# Patient Record
Sex: Female | Born: 1940 | Race: White | Hispanic: No | Smoking: Never smoker
Health system: Southern US, Community
[De-identification: ages and names within clinical notes are randomized; demographics above are authoritative.]

## PROBLEM LIST (undated history)

## (undated) DIAGNOSIS — M109 Gout, unspecified: Secondary | ICD-10-CM

## (undated) DIAGNOSIS — I2510 Atherosclerotic heart disease of native coronary artery without angina pectoris: Secondary | ICD-10-CM

## (undated) DIAGNOSIS — F419 Anxiety disorder, unspecified: Secondary | ICD-10-CM

## (undated) DIAGNOSIS — E119 Type 2 diabetes mellitus without complications: Secondary | ICD-10-CM

## (undated) DIAGNOSIS — I10 Essential (primary) hypertension: Secondary | ICD-10-CM

## (undated) DIAGNOSIS — I82409 Acute embolism and thrombosis of unspecified deep veins of unspecified lower extremity: Secondary | ICD-10-CM

## (undated) DIAGNOSIS — F99 Mental disorder, not otherwise specified: Secondary | ICD-10-CM

## (undated) DIAGNOSIS — R296 Repeated falls: Secondary | ICD-10-CM

## (undated) DIAGNOSIS — I209 Angina pectoris, unspecified: Secondary | ICD-10-CM

## (undated) DIAGNOSIS — E785 Hyperlipidemia, unspecified: Secondary | ICD-10-CM

## (undated) DIAGNOSIS — I743 Embolism and thrombosis of arteries of the lower extremities: Secondary | ICD-10-CM

## (undated) DIAGNOSIS — C439 Malignant melanoma of skin, unspecified: Secondary | ICD-10-CM

## (undated) DIAGNOSIS — N189 Chronic kidney disease, unspecified: Secondary | ICD-10-CM

## (undated) DIAGNOSIS — I219 Acute myocardial infarction, unspecified: Secondary | ICD-10-CM

## (undated) DIAGNOSIS — M1990 Unspecified osteoarthritis, unspecified site: Secondary | ICD-10-CM

## (undated) DIAGNOSIS — F329 Major depressive disorder, single episode, unspecified: Secondary | ICD-10-CM

## (undated) DIAGNOSIS — Z9289 Personal history of other medical treatment: Secondary | ICD-10-CM

## (undated) DIAGNOSIS — F32A Depression, unspecified: Secondary | ICD-10-CM

## (undated) DIAGNOSIS — D509 Iron deficiency anemia, unspecified: Secondary | ICD-10-CM

## (undated) DIAGNOSIS — K219 Gastro-esophageal reflux disease without esophagitis: Secondary | ICD-10-CM

## (undated) DIAGNOSIS — N183 Chronic kidney disease, stage 3 unspecified: Secondary | ICD-10-CM

## (undated) DIAGNOSIS — D649 Anemia, unspecified: Secondary | ICD-10-CM

## (undated) DIAGNOSIS — C801 Malignant (primary) neoplasm, unspecified: Secondary | ICD-10-CM

## (undated) DIAGNOSIS — D689 Coagulation defect, unspecified: Secondary | ICD-10-CM

## (undated) HISTORY — PX: CARDIAC CATHETERIZATION: SHX172

## (undated) HISTORY — PX: TOE SURGERY: SHX1073

## (undated) HISTORY — PX: COLONOSCOPY: SHX174

## (undated) HISTORY — PX: TUBAL LIGATION: SHX77

## (undated) HISTORY — PX: BACK SURGERY: SHX140

## (undated) HISTORY — DX: Hyperlipidemia, unspecified: E78.5

## (undated) HISTORY — PX: EYE SURGERY: SHX253

---

## 1965-12-16 HISTORY — PX: APPENDECTOMY: SHX54

## 1965-12-16 HISTORY — PX: CHOLECYSTECTOMY: SHX55

## 2004-10-26 ENCOUNTER — Ambulatory Visit: Payer: Self-pay | Admitting: General Practice

## 2004-12-16 DIAGNOSIS — I82409 Acute embolism and thrombosis of unspecified deep veins of unspecified lower extremity: Secondary | ICD-10-CM

## 2004-12-16 DIAGNOSIS — I82402 Acute embolism and thrombosis of unspecified deep veins of left lower extremity: Secondary | ICD-10-CM

## 2004-12-16 HISTORY — DX: Acute embolism and thrombosis of unspecified deep veins of left lower extremity: I82.402

## 2004-12-16 HISTORY — DX: Acute embolism and thrombosis of unspecified deep veins of unspecified lower extremity: I82.409

## 2005-03-27 ENCOUNTER — Ambulatory Visit: Payer: Self-pay

## 2005-10-28 ENCOUNTER — Ambulatory Visit: Payer: Self-pay | Admitting: Internal Medicine

## 2005-10-30 ENCOUNTER — Ambulatory Visit: Payer: Self-pay | Admitting: Internal Medicine

## 2006-01-21 ENCOUNTER — Ambulatory Visit: Payer: Self-pay | Admitting: Gerontology

## 2006-01-21 ENCOUNTER — Ambulatory Visit: Payer: Self-pay

## 2006-05-06 ENCOUNTER — Ambulatory Visit: Payer: Self-pay | Admitting: Internal Medicine

## 2006-05-11 ENCOUNTER — Emergency Department (HOSPITAL_COMMUNITY): Admission: EM | Admit: 2006-05-11 | Discharge: 2006-05-11 | Payer: Self-pay | Admitting: Emergency Medicine

## 2006-05-12 ENCOUNTER — Ambulatory Visit (HOSPITAL_COMMUNITY): Admission: RE | Admit: 2006-05-12 | Discharge: 2006-05-12 | Payer: Self-pay | Admitting: Emergency Medicine

## 2006-05-14 ENCOUNTER — Emergency Department (HOSPITAL_COMMUNITY): Admission: EM | Admit: 2006-05-14 | Discharge: 2006-05-14 | Payer: Self-pay | Admitting: Emergency Medicine

## 2006-06-02 ENCOUNTER — Ambulatory Visit: Payer: Self-pay | Admitting: Internal Medicine

## 2006-06-02 ENCOUNTER — Ambulatory Visit (HOSPITAL_COMMUNITY): Admission: RE | Admit: 2006-06-02 | Discharge: 2006-06-02 | Payer: Self-pay | Admitting: Neurosurgery

## 2006-06-03 ENCOUNTER — Ambulatory Visit: Payer: Self-pay

## 2006-06-04 ENCOUNTER — Ambulatory Visit (HOSPITAL_COMMUNITY): Admission: RE | Admit: 2006-06-04 | Discharge: 2006-06-05 | Payer: Self-pay | Admitting: Neurosurgery

## 2006-11-13 ENCOUNTER — Ambulatory Visit: Payer: Self-pay | Admitting: Internal Medicine

## 2007-11-16 ENCOUNTER — Ambulatory Visit: Payer: Self-pay | Admitting: Internal Medicine

## 2008-11-16 ENCOUNTER — Ambulatory Visit: Payer: Self-pay | Admitting: Internal Medicine

## 2009-11-20 ENCOUNTER — Ambulatory Visit: Payer: Self-pay | Admitting: Internal Medicine

## 2009-11-23 ENCOUNTER — Ambulatory Visit: Payer: Self-pay | Admitting: Internal Medicine

## 2010-01-29 ENCOUNTER — Ambulatory Visit: Payer: Self-pay

## 2010-11-21 ENCOUNTER — Ambulatory Visit: Payer: Self-pay | Admitting: Internal Medicine

## 2011-01-06 ENCOUNTER — Encounter: Payer: Self-pay | Admitting: Neurosurgery

## 2011-10-16 ENCOUNTER — Ambulatory Visit: Payer: Self-pay | Admitting: Internal Medicine

## 2011-12-12 ENCOUNTER — Ambulatory Visit: Payer: Self-pay | Admitting: Internal Medicine

## 2012-06-22 ENCOUNTER — Observation Stay: Payer: Self-pay | Admitting: Internal Medicine

## 2012-06-22 LAB — COMPREHENSIVE METABOLIC PANEL
Anion Gap: 10 (ref 7–16)
BUN: 25 mg/dL — ABNORMAL HIGH (ref 7–18)
Calcium, Total: 8.8 mg/dL (ref 8.5–10.1)
Creatinine: 1.44 mg/dL — ABNORMAL HIGH (ref 0.60–1.30)
EGFR (African American): 43 — ABNORMAL LOW
Glucose: 191 mg/dL — ABNORMAL HIGH (ref 65–99)
Potassium: 3.2 mmol/L — ABNORMAL LOW (ref 3.5–5.1)
SGOT(AST): 22 U/L (ref 15–37)
SGPT (ALT): 16 U/L
Sodium: 140 mmol/L (ref 136–145)
Total Protein: 7.5 g/dL (ref 6.4–8.2)

## 2012-06-22 LAB — CK TOTAL AND CKMB (NOT AT ARMC)
CK, Total: 107 U/L (ref 21–215)
CK-MB: 0.9 ng/mL (ref 0.5–3.6)

## 2012-06-22 LAB — CBC
HCT: 32.5 % — ABNORMAL LOW (ref 35.0–47.0)
HGB: 10.8 g/dL — ABNORMAL LOW (ref 12.0–16.0)

## 2012-06-23 DIAGNOSIS — Z9289 Personal history of other medical treatment: Secondary | ICD-10-CM

## 2012-06-23 HISTORY — DX: Personal history of other medical treatment: Z92.89

## 2012-12-14 ENCOUNTER — Ambulatory Visit: Payer: Self-pay | Admitting: Internal Medicine

## 2013-03-01 ENCOUNTER — Ambulatory Visit: Payer: Self-pay | Admitting: Physician Assistant

## 2013-05-18 ENCOUNTER — Ambulatory Visit: Payer: Self-pay | Admitting: Neurosurgery

## 2013-06-02 ENCOUNTER — Ambulatory Visit: Payer: Self-pay | Admitting: Neurosurgery

## 2013-06-08 ENCOUNTER — Other Ambulatory Visit: Payer: Self-pay | Admitting: Neurosurgery

## 2013-06-10 ENCOUNTER — Other Ambulatory Visit (HOSPITAL_COMMUNITY): Payer: Self-pay

## 2013-06-14 ENCOUNTER — Encounter (HOSPITAL_COMMUNITY): Payer: Self-pay | Admitting: Pharmacy Technician

## 2013-06-17 NOTE — Pre-Procedure Instructions (Signed)
KEON WALTERMIRE  06/17/2013   Your procedure is scheduled on:  Monday, July 14th.   Report to [place] at 10:15 AM.  Call this number if you have problems the morning of surgery: [phone]   Remember:   Do not eat food or drink liquids after midnight.   Take these medicines the morning of surgery with A SIP OF WATER: citalopram (CELEXA), COLCHICINE, OMEPRAZOLE.              Take if needed:oxyCODONE-acetaminophen (PERCOCET/ROXICET).    Do not wear jewelry, make-up or nail polish.  Do not wear lotions, powders, or perfumes. You may wear deodorant.  Do not shave 48 hours prior to surgery. Men may shave face and neck.  Do not bring valuables to the hospital.  [facility] is not responsible  for any belongings or valuables.  Contacts, dentures or bridgework may not be worn into surgery.  Leave suitcase in the car. After surgery it may be brought to your room.  For patients admitted to the hospital, checkout time is 11:00 AM the day of discharge.   Patients discharged the day of surgery will not be allowed to drive home.  Name and phone number of your driver:-   Special Instructions: Shower using CHG 2 nights before surgery and the night before surgery.  If you shower the day of surgery use CHG.  Use special wash - you have one bottle of CHG for all showers.  You should use approximately 1/3 of the bottle for each shower.   Please read over the following fact sheets that you were given: Pain Booklet, Coughing and Deep Breathing and Surgical Site Infection Prevention

## 2013-06-21 ENCOUNTER — Encounter (HOSPITAL_COMMUNITY)
Admission: RE | Admit: 2013-06-21 | Discharge: 2013-06-21 | Disposition: A | Discharge status: Home / Self Care | Payer: Medicare Other | Source: Ambulatory Visit | Attending: Anesthesiology | Admitting: Anesthesiology

## 2013-06-21 ENCOUNTER — Encounter (HOSPITAL_COMMUNITY)
Admission: RE | Admit: 2013-06-21 | Discharge: 2013-06-21 | Disposition: A | Discharge status: Home / Self Care | Payer: Medicare Other | Source: Ambulatory Visit | Attending: Neurosurgery | Admitting: Neurosurgery

## 2013-06-21 ENCOUNTER — Encounter (HOSPITAL_COMMUNITY): Payer: Self-pay

## 2013-06-21 HISTORY — DX: Type 2 diabetes mellitus without complications: E11.9

## 2013-06-21 HISTORY — DX: Malignant (primary) neoplasm, unspecified: C80.1

## 2013-06-21 HISTORY — DX: Gastro-esophageal reflux disease without esophagitis: K21.9

## 2013-06-21 HISTORY — DX: Mental disorder, not otherwise specified: F99

## 2013-06-21 HISTORY — DX: Acute myocardial infarction, unspecified: I21.9

## 2013-06-21 HISTORY — DX: Depression, unspecified: F32.A

## 2013-06-21 HISTORY — DX: Coagulation defect, unspecified: D68.9

## 2013-06-21 HISTORY — DX: Major depressive disorder, single episode, unspecified: F32.9

## 2013-06-21 HISTORY — DX: Essential (primary) hypertension: I10

## 2013-06-21 LAB — BASIC METABOLIC PANEL
BUN: 25 mg/dL — ABNORMAL HIGH (ref 6–23)
CO2: 27 mEq/L (ref 19–32)
Chloride: 101 mEq/L (ref 96–112)
GFR calc Af Amer: 50 mL/min — ABNORMAL LOW (ref >90)
GFR calc non Af Amer: 43 mL/min — ABNORMAL LOW (ref >90)
Glucose, Bld: 126 mg/dL — ABNORMAL HIGH (ref 70–99)
Potassium: 4.7 mEq/L (ref 3.5–5.1)
Sodium: 139 mEq/L (ref 135–145)

## 2013-06-21 LAB — CBC
HCT: 35.2 % — ABNORMAL LOW (ref 36.0–46.0)
Hemoglobin: 11.6 g/dL — ABNORMAL LOW (ref 12.0–15.0)
MCH: 30.1 pg (ref 26.0–34.0)
Platelets: 226 10*3/uL (ref 150–400)
RBC: 3.85 MIL/uL — ABNORMAL LOW (ref 3.87–5.11)
RDW: 12.7 % (ref 11.5–15.5)

## 2013-06-21 LAB — SURGICAL PCR SCREEN: MRSA, PCR: NEGATIVE

## 2013-06-21 NOTE — Progress Notes (Signed)
REQUESTING STRESS TEST, OFFICE NOTE FROM [place].

## 2013-06-22 ENCOUNTER — Encounter (HOSPITAL_COMMUNITY): Payer: Self-pay

## 2013-06-22 NOTE — Progress Notes (Signed)
Anesthesia chart review: Patient is a 72 year old female scheduled for right L4-5 laminectomy by Dr. Saintclair Halsted on 06/28/2013. History includes nonsmoker, HTN, question of MI > 10 years ago (although not listed in her PCP history and no evidence of infarction on Lexiscan '13), diabetes mellitus type 2, LLE DVT '06, GERD, depression, melanoma (left arm), cholecystectomy, prior back surgery '07. PCP is Dr. Frazier Richards at [facility].  His notes also indicate a history of non-toxic multinodular goiter, chronic anemia with negative work-up, and hypercholesterolemia.     She had a negative Lexiscan nuclear stress test on 06/23/12 showing no evidence of ischemia or infarction, normal LV function [facility]).  EKG on 06/21/13 showed SB @ 49 bpm, LAD, low voltage QRS.  Anterior T wave abnormality less apparent.  Overall, her EKG was felt not significantly changed since 05/29/06 (see Muse).  (HR was 67 bpm at the time of PAT vitals.)  Preoperative CXR and labs noted.    Patient had a negative stress test 1 year ago.  No CV symptoms were documented at her PAT visit.  If no acute changes then I would anticipate that she could proceed as planned.  [facility] Short Stay Center/Anesthesiology Phone [phone] 06/22/2013 11:54 AM

## 2013-06-27 MED ORDER — DEXAMETHASONE SODIUM PHOSPHATE 10 MG/ML IJ SOLN
10.0000 mg | INTRAMUSCULAR | Status: DC
  Filled 2013-06-27: qty 1

## 2013-06-27 MED ORDER — CEFAZOLIN SODIUM-DEXTROSE 2-3 GM-% IV SOLR
2.0000 g | INTRAVENOUS | Status: AC
  Administered 2013-06-28: 2 g via INTRAVENOUS
  Filled 2013-06-27: qty 50

## 2013-06-28 ENCOUNTER — Inpatient Hospital Stay (HOSPITAL_COMMUNITY)
Admission: RE | Admit: 2013-06-28 | Discharge: 2013-06-29 | DRG: 491 | Disposition: A | Discharge status: Home / Self Care | Payer: Medicare Other | Source: Ambulatory Visit | Attending: Neurosurgery | Admitting: Neurosurgery

## 2013-06-28 ENCOUNTER — Encounter (HOSPITAL_COMMUNITY)
Admission: RE | Disposition: A | Discharge status: Home / Self Care | Payer: Self-pay | Source: Ambulatory Visit | Attending: Neurosurgery

## 2013-06-28 ENCOUNTER — Inpatient Hospital Stay (HOSPITAL_COMMUNITY): Payer: Medicare Other

## 2013-06-28 ENCOUNTER — Encounter (HOSPITAL_COMMUNITY): Payer: Self-pay | Admitting: Vascular Surgery

## 2013-06-28 ENCOUNTER — Encounter (HOSPITAL_COMMUNITY): Payer: Self-pay | Admitting: *Deleted

## 2013-06-28 ENCOUNTER — Inpatient Hospital Stay (HOSPITAL_COMMUNITY): Payer: Medicare Other | Admitting: Anesthesiology

## 2013-06-28 DIAGNOSIS — Z01812 Encounter for preprocedural laboratory examination: Secondary | ICD-10-CM

## 2013-06-28 DIAGNOSIS — Z01818 Encounter for other preprocedural examination: Secondary | ICD-10-CM

## 2013-06-28 DIAGNOSIS — Z7982 Long term (current) use of aspirin: Secondary | ICD-10-CM

## 2013-06-28 DIAGNOSIS — E119 Type 2 diabetes mellitus without complications: Secondary | ICD-10-CM | POA: Diagnosis present

## 2013-06-28 DIAGNOSIS — F329 Major depressive disorder, single episode, unspecified: Secondary | ICD-10-CM | POA: Diagnosis present

## 2013-06-28 DIAGNOSIS — I252 Old myocardial infarction: Secondary | ICD-10-CM

## 2013-06-28 DIAGNOSIS — K219 Gastro-esophageal reflux disease without esophagitis: Secondary | ICD-10-CM | POA: Diagnosis present

## 2013-06-28 DIAGNOSIS — Z79899 Other long term (current) drug therapy: Secondary | ICD-10-CM

## 2013-06-28 DIAGNOSIS — M47817 Spondylosis without myelopathy or radiculopathy, lumbosacral region: Principal | ICD-10-CM | POA: Diagnosis present

## 2013-06-28 DIAGNOSIS — Z01810 Encounter for preprocedural cardiovascular examination: Secondary | ICD-10-CM

## 2013-06-28 DIAGNOSIS — I10 Essential (primary) hypertension: Secondary | ICD-10-CM | POA: Diagnosis present

## 2013-06-28 DIAGNOSIS — M109 Gout, unspecified: Secondary | ICD-10-CM | POA: Diagnosis present

## 2013-06-28 DIAGNOSIS — Z85820 Personal history of malignant melanoma of skin: Secondary | ICD-10-CM

## 2013-06-28 DIAGNOSIS — F3289 Other specified depressive episodes: Secondary | ICD-10-CM | POA: Diagnosis present

## 2013-06-28 HISTORY — PX: LUMBAR LAMINECTOMY/DECOMPRESSION MICRODISCECTOMY: SHX5026

## 2013-06-28 LAB — GLUCOSE, CAPILLARY
Glucose-Capillary: 115 mg/dL — ABNORMAL HIGH (ref 70–99)
Glucose-Capillary: 149 mg/dL — ABNORMAL HIGH (ref 70–99)
Glucose-Capillary: 178 mg/dL — ABNORMAL HIGH (ref 70–99)
Glucose-Capillary: 322 mg/dL — ABNORMAL HIGH (ref 70–99)

## 2013-06-28 SURGERY — LUMBAR LAMINECTOMY/DECOMPRESSION MICRODISCECTOMY 1 LEVEL
Anesthesia: General | Site: Back | Laterality: Right | Wound class: Clean

## 2013-06-28 MED ORDER — SODIUM CHLORIDE 0.9 % IJ SOLN: 3.0000 mL | INTRAMUSCULAR | Status: DC | PRN

## 2013-06-28 MED ORDER — LIDOCAINE HCL (CARDIAC) 20 MG/ML IV SOLN
INTRAVENOUS | Status: DC | PRN
  Administered 2013-06-28: 100 mg via INTRAVENOUS

## 2013-06-28 MED ORDER — LOSARTAN POTASSIUM 50 MG PO TABS
100.0000 mg | ORAL_TABLET | Freq: Every day | ORAL | Status: DC
  Administered 2013-06-28: 100 mg via ORAL
  Filled 2013-06-28 (×2): qty 2

## 2013-06-28 MED ORDER — ARTIFICIAL TEARS OP OINT
TOPICAL_OINTMENT | OPHTHALMIC | Status: DC | PRN
  Administered 2013-06-28: 1 via OPHTHALMIC

## 2013-06-28 MED ORDER — OXYCODONE-ACETAMINOPHEN 5-325 MG PO TABS
1.0000 | ORAL_TABLET | ORAL | Status: DC | PRN
  Administered 2013-06-29: 2 via ORAL
  Filled 2013-06-28: qty 2

## 2013-06-28 MED ORDER — PANTOPRAZOLE SODIUM 40 MG PO TBEC
40.0000 mg | DELAYED_RELEASE_TABLET | Freq: Every day | ORAL | Status: DC
  Administered 2013-06-28: 40 mg via ORAL
  Filled 2013-06-28: qty 1

## 2013-06-28 MED ORDER — BUPIVACAINE HCL (PF) 0.25 % IJ SOLN
INTRAMUSCULAR | Status: DC | PRN
  Administered 2013-06-28: 10 mL

## 2013-06-28 MED ORDER — SODIUM CHLORIDE 0.9 % IJ SOLN
3.0000 mL | Freq: Two times a day (BID) | INTRAMUSCULAR | Status: DC
  Administered 2013-06-28: 3 mL via INTRAVENOUS

## 2013-06-28 MED ORDER — LIDOCAINE-EPINEPHRINE 1 %-1:100000 IJ SOLN
INTRAMUSCULAR | Status: DC | PRN
  Administered 2013-06-28: 10 mL via INTRADERMAL

## 2013-06-28 MED ORDER — SODIUM CHLORIDE 0.9 % IV SOLN
INTRAVENOUS | Status: AC
  Filled 2013-06-28: qty 500

## 2013-06-28 MED ORDER — LACTATED RINGERS IV SOLN
INTRAVENOUS | Status: DC | PRN
  Administered 2013-06-28 (×3): via INTRAVENOUS

## 2013-06-28 MED ORDER — INSULIN ASPART 100 UNIT/ML ~~LOC~~ SOLN
0.0000 [IU] | Freq: Three times a day (TID) | SUBCUTANEOUS | Status: DC
  Administered 2013-06-29: 3 [IU] via SUBCUTANEOUS

## 2013-06-28 MED ORDER — INSULIN ASPART 100 UNIT/ML ~~LOC~~ SOLN
0.0000 [IU] | Freq: Every day | SUBCUTANEOUS | Status: DC
  Administered 2013-06-28: 4 [IU] via SUBCUTANEOUS

## 2013-06-28 MED ORDER — HYDROCHLOROTHIAZIDE 25 MG PO TABS
25.0000 mg | ORAL_TABLET | Freq: Every day | ORAL | Status: DC
  Administered 2013-06-28: 25 mg via ORAL
  Filled 2013-06-28 (×2): qty 1

## 2013-06-28 MED ORDER — ASPIRIN EC 81 MG PO TBEC
81.0000 mg | DELAYED_RELEASE_TABLET | Freq: Every day | ORAL | Status: DC
  Administered 2013-06-28: 81 mg via ORAL
  Filled 2013-06-28 (×2): qty 1

## 2013-06-28 MED ORDER — LOSARTAN POTASSIUM-HCTZ 100-25 MG PO TABS: 1.0000 | ORAL_TABLET | Freq: Every day | ORAL | Status: DC

## 2013-06-28 MED ORDER — HYDROMORPHONE HCL PF 1 MG/ML IJ SOLN: 0.5000 mg | INTRAMUSCULAR | Status: DC | PRN

## 2013-06-28 MED ORDER — OXYCODONE-ACETAMINOPHEN 5-325 MG PO TABS: 0.5000 | ORAL_TABLET | ORAL | Status: DC | PRN

## 2013-06-28 MED ORDER — SIMVASTATIN 5 MG PO TABS
5.0000 mg | ORAL_TABLET | Freq: Every day | ORAL | Status: DC
  Administered 2013-06-28: 5 mg via ORAL
  Filled 2013-06-28 (×2): qty 1

## 2013-06-28 MED ORDER — CITALOPRAM HYDROBROMIDE 20 MG PO TABS
20.0000 mg | ORAL_TABLET | Freq: Every evening | ORAL | Status: DC
  Administered 2013-06-28: 20 mg via ORAL
  Filled 2013-06-28 (×2): qty 1

## 2013-06-28 MED ORDER — THROMBIN 5000 UNITS EX SOLR
CUTANEOUS | Status: DC | PRN
  Administered 2013-06-28: 5000 [IU] via TOPICAL

## 2013-06-28 MED ORDER — FENTANYL CITRATE 0.05 MG/ML IJ SOLN: INTRAMUSCULAR | Status: DC | PRN

## 2013-06-28 MED ORDER — ONDANSETRON HCL 4 MG/2ML IJ SOLN
INTRAMUSCULAR | Status: DC | PRN
  Administered 2013-06-28: 4 mg via INTRAVENOUS

## 2013-06-28 MED ORDER — SODIUM CHLORIDE 0.9 % IV SOLN: 250.0000 mL | INTRAVENOUS | Status: DC

## 2013-06-28 MED ORDER — ALUM & MAG HYDROXIDE-SIMETH 200-200-20 MG/5ML PO SUSP
30.0000 mL | Freq: Four times a day (QID) | ORAL | Status: DC | PRN
  Administered 2013-06-28: 30 mL via ORAL
  Filled 2013-06-28: qty 30

## 2013-06-28 MED ORDER — INSULIN ASPART 100 UNIT/ML ~~LOC~~ SOLN
4.0000 [IU] | Freq: Three times a day (TID) | SUBCUTANEOUS | Status: DC
  Administered 2013-06-29: 4 [IU] via SUBCUTANEOUS

## 2013-06-28 MED ORDER — 0.9 % SODIUM CHLORIDE (POUR BTL) OPTIME
TOPICAL | Status: DC | PRN
  Administered 2013-06-28: 1000 mL

## 2013-06-28 MED ORDER — MENTHOL 3 MG MT LOZG: 1.0000 | LOZENGE | OROMUCOSAL | Status: DC | PRN

## 2013-06-28 MED ORDER — SUFENTANIL CITRATE 50 MCG/ML IV SOLN
INTRAVENOUS | Status: DC | PRN
  Administered 2013-06-28: 5 ug via INTRAVENOUS
  Administered 2013-06-28 (×3): 10 ug via INTRAVENOUS

## 2013-06-28 MED ORDER — METFORMIN HCL ER 500 MG PO TB24
500.0000 mg | ORAL_TABLET | Freq: Every day | ORAL | Status: DC
  Administered 2013-06-28: 500 mg via ORAL
  Filled 2013-06-28 (×2): qty 1

## 2013-06-28 MED ORDER — COLCHICINE 0.6 MG PO TABS
0.6000 mg | ORAL_TABLET | Freq: Every day | ORAL | Status: DC | PRN
  Filled 2013-06-28: qty 1

## 2013-06-28 MED ORDER — PROPOFOL 10 MG/ML IV BOLUS
INTRAVENOUS | Status: DC | PRN
  Administered 2013-06-28: 200 mg via INTRAVENOUS

## 2013-06-28 MED ORDER — ACETAMINOPHEN 325 MG PO TABS: 650.0000 mg | ORAL_TABLET | ORAL | Status: DC | PRN

## 2013-06-28 MED ORDER — ACETAMINOPHEN 650 MG RE SUPP: 650.0000 mg | RECTAL | Status: DC | PRN

## 2013-06-28 MED ORDER — ONDANSETRON HCL 4 MG/2ML IJ SOLN: 4.0000 mg | INTRAMUSCULAR | Status: DC | PRN

## 2013-06-28 MED ORDER — NEOSTIGMINE METHYLSULFATE 1 MG/ML IJ SOLN
INTRAMUSCULAR | Status: DC | PRN
  Administered 2013-06-28: 5 mg via INTRAVENOUS

## 2013-06-28 MED ORDER — DOCUSATE SODIUM 100 MG PO CAPS
100.0000 mg | ORAL_CAPSULE | Freq: Two times a day (BID) | ORAL | Status: DC
  Administered 2013-06-28: 100 mg via ORAL
  Filled 2013-06-28: qty 1

## 2013-06-28 MED ORDER — ROCURONIUM BROMIDE 100 MG/10ML IV SOLN
INTRAVENOUS | Status: DC | PRN
  Administered 2013-06-28: 50 mg via INTRAVENOUS

## 2013-06-28 MED ORDER — SODIUM CHLORIDE 0.9 % IV SOLN
INTRAVENOUS | Status: DC
  Administered 2013-06-28: 13:00:00 via INTRAVENOUS

## 2013-06-28 MED ORDER — CEFAZOLIN SODIUM 1-5 GM-% IV SOLN
1.0000 g | Freq: Three times a day (TID) | INTRAVENOUS | Status: AC
  Administered 2013-06-28 – 2013-06-29 (×2): 1 g via INTRAVENOUS
  Filled 2013-06-28 (×2): qty 50

## 2013-06-28 MED ORDER — CYCLOBENZAPRINE HCL 10 MG PO TABS: 10.0000 mg | ORAL_TABLET | Freq: Three times a day (TID) | ORAL | Status: DC | PRN

## 2013-06-28 MED ORDER — HEMOSTATIC AGENTS (NO CHARGE) OPTIME
TOPICAL | Status: DC | PRN
  Administered 2013-06-28: 1 via TOPICAL

## 2013-06-28 MED ORDER — BACITRACIN 50000 UNITS IM SOLR
INTRAMUSCULAR | Status: AC
  Filled 2013-06-28: qty 1

## 2013-06-28 MED ORDER — PHENOL 1.4 % MT LIQD: 1.0000 | OROMUCOSAL | Status: DC | PRN

## 2013-06-28 MED ORDER — EPHEDRINE SULFATE 50 MG/ML IJ SOLN
INTRAMUSCULAR | Status: DC | PRN
  Administered 2013-06-28: 5 mg via INTRAVENOUS

## 2013-06-28 MED ORDER — MIDAZOLAM HCL 5 MG/5ML IJ SOLN
INTRAMUSCULAR | Status: DC | PRN
  Administered 2013-06-28 (×2): 1 mg via INTRAVENOUS

## 2013-06-28 MED ORDER — SODIUM CHLORIDE 0.9 % IR SOLN
Status: DC | PRN
  Administered 2013-06-28: 15:00:00

## 2013-06-28 MED ORDER — GLYCOPYRROLATE 0.2 MG/ML IJ SOLN
INTRAMUSCULAR | Status: DC | PRN
  Administered 2013-06-28: 0.6 mg via INTRAVENOUS

## 2013-06-28 SURGICAL SUPPLY — 54 items
BAG DECANTER FOR FLEXI CONT (MISCELLANEOUS) ×2 IMPLANT
BENZOIN TINCTURE PRP APPL 2/3 (GAUZE/BANDAGES/DRESSINGS) ×2 IMPLANT
BLADE SURG 11 STRL SS (BLADE) ×2 IMPLANT
BLADE SURG ROTATE 9660 (MISCELLANEOUS) IMPLANT
BRUSH SCRUB EZ PLAIN DRY (MISCELLANEOUS) ×2 IMPLANT
BUR MATCHSTICK NEURO 3.0 LAGG (BURR) ×2 IMPLANT
BUR PRECISION FLUTE 6.0 (BURR) ×2 IMPLANT
CANISTER SUCTION 2500CC (MISCELLANEOUS) ×2 IMPLANT
CLOTH BEACON ORANGE TIMEOUT ST (SAFETY) ×2 IMPLANT
CONT SPEC 4OZ CLIKSEAL STRL BL (MISCELLANEOUS) ×2 IMPLANT
DECANTER SPIKE VIAL GLASS SM (MISCELLANEOUS) ×2 IMPLANT
DERMABOND ADVANCED (GAUZE/BANDAGES/DRESSINGS) ×1
DERMABOND ADVANCED .7 DNX12 (GAUZE/BANDAGES/DRESSINGS) ×1 IMPLANT
DRAPE LAPAROTOMY 100X72X124 (DRAPES) ×2 IMPLANT
DRAPE MICROSCOPE LEICA (MISCELLANEOUS) ×2 IMPLANT
DRAPE MICROSCOPE ZEISS OPMI (DRAPES) IMPLANT
DRAPE POUCH INSTRU U-SHP 10X18 (DRAPES) ×2 IMPLANT
DRAPE PROXIMA HALF (DRAPES) IMPLANT
DRAPE SURG 17X23 STRL (DRAPES) ×2 IMPLANT
DRSG OPSITE 4X5.5 SM (GAUZE/BANDAGES/DRESSINGS) ×2 IMPLANT
DURAPREP 26ML APPLICATOR (WOUND CARE) ×2 IMPLANT
ELECT REM PT RETURN 9FT ADLT (ELECTROSURGICAL) ×2
ELECTRODE REM PT RTRN 9FT ADLT (ELECTROSURGICAL) ×1 IMPLANT
GAUZE SPONGE 4X4 16PLY XRAY LF (GAUZE/BANDAGES/DRESSINGS) IMPLANT
GLOVE BIO SURGEON STRL SZ8 (GLOVE) ×2 IMPLANT
GLOVE BIOGEL PI IND STRL 8.5 (GLOVE) ×1 IMPLANT
GLOVE BIOGEL PI INDICATOR 8.5 (GLOVE) ×1
GLOVE ECLIPSE 8.5 STRL (GLOVE) ×2 IMPLANT
GLOVE EXAM NITRILE LRG STRL (GLOVE) IMPLANT
GLOVE EXAM NITRILE MD LF STRL (GLOVE) ×2 IMPLANT
GLOVE EXAM NITRILE XL STR (GLOVE) IMPLANT
GLOVE EXAM NITRILE XS STR PU (GLOVE) IMPLANT
GLOVE INDICATOR 8.5 STRL (GLOVE) ×2 IMPLANT
GOWN BRE IMP SLV AUR LG STRL (GOWN DISPOSABLE) IMPLANT
GOWN BRE IMP SLV AUR XL STRL (GOWN DISPOSABLE) ×4 IMPLANT
GOWN STRL REIN 2XL LVL4 (GOWN DISPOSABLE) ×2 IMPLANT
KIT BASIN OR (CUSTOM PROCEDURE TRAY) ×2 IMPLANT
KIT ROOM TURNOVER OR (KITS) ×2 IMPLANT
NEEDLE HYPO 22GX1.5 SAFETY (NEEDLE) ×2 IMPLANT
NEEDLE SPNL 22GX3.5 QUINCKE BK (NEEDLE) ×2 IMPLANT
NS IRRIG 1000ML POUR BTL (IV SOLUTION) ×2 IMPLANT
PACK LAMINECTOMY NEURO (CUSTOM PROCEDURE TRAY) ×2 IMPLANT
RUBBERBAND STERILE (MISCELLANEOUS) ×4 IMPLANT
SPONGE GAUZE 4X4 12PLY (GAUZE/BANDAGES/DRESSINGS) ×2 IMPLANT
SPONGE SURGIFOAM ABS GEL SZ50 (HEMOSTASIS) ×2 IMPLANT
STRIP CLOSURE SKIN 1/2X4 (GAUZE/BANDAGES/DRESSINGS) ×2 IMPLANT
SUT VIC AB 0 CT1 18XCR BRD8 (SUTURE) ×1 IMPLANT
SUT VIC AB 0 CT1 8-18 (SUTURE) ×1
SUT VIC AB 2-0 CT1 18 (SUTURE) ×2 IMPLANT
SUT VICRYL 4-0 PS2 18IN ABS (SUTURE) ×2 IMPLANT
SYR 20ML ECCENTRIC (SYRINGE) ×2 IMPLANT
TOWEL OR 17X24 6PK STRL BLUE (TOWEL DISPOSABLE) ×2 IMPLANT
TOWEL OR 17X26 10 PK STRL BLUE (TOWEL DISPOSABLE) ×2 IMPLANT
WATER STERILE IRR 1000ML POUR (IV SOLUTION) ×2 IMPLANT

## 2013-06-28 NOTE — Preoperative (Signed)
Beta Blockers   Reason not to administer Beta Blockers:Not Applicable 

## 2013-06-28 NOTE — Anesthesia Postprocedure Evaluation (Signed)
  Anesthesia Post-op Note  Patient: Lindsey Shaw  Procedure(s) Performed: Procedure(s) with comments: LUMBAR LAMINECTOMY/DECOMPRESSION MICRODISCECTOMY 1 LEVEL (Right) - Lumbar Laminectomy Decompression Lumbar Four-Five Right  Patient Location: PACU  Anesthesia Type:General  Level of Consciousness: awake  Airway and Oxygen Therapy: Patient Spontanous Breathing  Post-op Pain: mild  Post-op Assessment: Post-op Vital signs reviewed, Patient's Cardiovascular Status Stable, Respiratory Function Stable, Patent Airway, No signs of Nausea or vomiting and Pain level controlled  Post-op Vital Signs: stable  Complications: No apparent anesthesia complications

## 2013-06-28 NOTE — H&P (Signed)
Lindsey Shaw is an 72 y.o. female.   Chief Complaint: Back and right leg pain HPI: Patient is a very pleasant 72 year old female has had long-standing back and thumb and right hip and leg pain that would radiate around her right hip down the outside posterior aspect of right thigh around it needed for the shin consistent with an L4 nerve root pattern. 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 I extensively went over the risks and benefits of that operation with her as well as perioperative course expectations about alternatives of surgery she is understood that and agreed to proceed forward.  Past Medical History  Diagnosis Date  . Mental disorder   . Depression   . Diabetes mellitus without complication   . Blood clotting tendency     2006   BLOOD CLOTT LEG TO GROIN  . GERD (gastroesophageal reflux disease)   . Cancer     MELANOMA   . Hypertension   . Myocardial infarction     10+ YRS (Lexiscan: no evid of ischemia/infarct, EF 72% 06/23/12 [facility])    Past Surgical History  Procedure Laterality Date  . Tubal ligation      73  . Toe surgery      BIL GREAT TOE JOINT REMOVED  . Cholecystectomy    . Back surgery      2007    History reviewed. No pertinent family history. Social History:  reports that she has never smoked. She does not have any smokeless tobacco history on file. She reports that she does not drink alcohol or use illicit drugs.  Allergies: No Known Allergies  Medications Prior to Admission  Medication Sig Dispense Refill  . aspirin EC 81 MG tablet Take 81 mg by mouth  daily.      . citalopram (CELEXA) 20 MG tablet Take 20 mg by mouth every evening.      . colchicine 0.6 MG tablet Take 0.6 mg by mouth daily as needed (gout).      Marland Kitchen losartan-hydrochlorothiazide (HYZAAR) 100-25 MG per tablet Take 1 tablet by mouth daily.      Marland Kitchen lovastatin (MEVACOR) 40 MG tablet Take 40 mg by mouth at bedtime.      . metformin (FORTAMET) 500 MG (OSM) 24 hr tablet Take 500 mg by mouth every evening.      Marland Kitchen omeprazole (PRILOSEC) 20 MG capsule Take 20 mg by mouth every evening.      Marland Kitchen oxyCODONE-acetaminophen (PERCOCET/ROXICET) 5-325 MG per tablet Take 0.5-1 tablets by mouth every 4 (four) hours as needed for pain.        Results for orders placed during the hospital encounter of 06/28/13 (from the past 48 hour(s))  GLUCOSE, CAPILLARY     Status: Abnormal   Collection Time    06/28/13 12:32 PM      Result Value Range   Glucose-Capillary 115 (*) 70 - 99 mg/dL   No results found.  Review of Systems  Constitutional: Negative.   HENT: Negative.   Eyes: Negative.   Respiratory: Negative.   Cardiovascular: Negative.   Gastrointestinal: Negative.   Genitourinary: Negative.   Musculoskeletal: Positive for myalgias and back  pain.  Skin: Negative.   Neurological: Positive for tingling.  Endo/Heme/Allergies: Negative.     Blood pressure 130/78, pulse 68, temperature 97.5 F (36.4 C), temperature source Oral, resp. rate 16, SpO2 97.00%. Physical Exam  Constitutional: She is oriented to person, place, and time. She appears well-developed.  HENT:  Head: Normocephalic.  Eyes: Pupils are equal, round, and reactive to light.  Neck: Normal range of motion. Neck supple.  Cardiovascular: Normal rate.   Respiratory: Effort normal.  GI: Soft.  Musculoskeletal: Normal range of motion.  Neurological: She is alert and oriented to person, place, and time. She has normal strength. GCS eye subscore is 4. GCS verbal subscore is 5. GCS motor subscore is 6.  Reflex Scores:      Patellar  reflexes are 0 on the right side and 0 on the left side.      Achilles reflexes are 0 on the right side and 0 on the left side. Strength is 5 out of 5 in her iliopsoas, quads, and she's, gastrocs, and tibialis, EHL.  Skin: Skin is warm and dry.     Assessment/Plan 72 year old female presents for right-sided decompressive laminectomy at L4-5.  Gurshan Settlemire P 06/28/2013, 1:36 PM

## 2013-06-28 NOTE — Transfer of Care (Signed)
Immediate Anesthesia Transfer of Care Note  Patient: Lindsey Shaw  Procedure(s) Performed: Procedure(s) with comments: LUMBAR LAMINECTOMY/DECOMPRESSION MICRODISCECTOMY 1 LEVEL (Right) - Lumbar Laminectomy Decompression Lumbar Four-Five Right  Patient Location: PACU  Anesthesia Type:General  Level of Consciousness: sedated  Airway & Oxygen Therapy: Patient Spontanous Breathing and Patient connected to face mask oxygen  Post-op Assessment: Report given to PACU RN and Post -op Vital signs reviewed and stable  Post vital signs: Reviewed and stable  Complications: No apparent anesthesia complications

## 2013-06-28 NOTE — Anesthesia Preprocedure Evaluation (Addendum)
Anesthesia Evaluation  Patient identified by MRN, date of birth, ID band Patient awake    Reviewed: Allergy & Precautions, H&P , NPO status , Patient's Chart, lab work & pertinent test results  Airway Mallampati: II TM Distance: >3 FB Neck ROM: Limited    Dental  (+) Teeth Intact and Dental Advidsory Given   Pulmonary  breath sounds clear to auscultation        Cardiovascular hypertension, On Medications + Past MI Rhythm:Regular Rate:Normal     Neuro/Psych    GI/Hepatic GERD-  Medicated and Controlled,  Endo/Other  diabetes, Well Controlled, Type 2, Oral Hypoglycemic Agents  Renal/GU      Musculoskeletal   Abdominal   Peds  Hematology   Anesthesia Other Findings   Reproductive/Obstetrics                          Anesthesia Physical Anesthesia Plan  ASA: III  Anesthesia Plan: General   Post-op Pain Management:    Induction: Intravenous  Airway Management Planned: Oral ETT and Video Laryngoscope Planned  Additional Equipment:   Intra-op Plan:   Post-operative Plan: Extubation in OR  Informed Consent: I have reviewed the patients History and Physical, chart, labs and discussed the procedure including the risks, benefits and alternatives for the proposed anesthesia with the patient or authorized representative who has indicated his/her understanding and acceptance.   Dental Advisory Given  Plan Discussed with: CRNA and Surgeon  Anesthesia Plan Comments:        Anesthesia Quick Evaluation

## 2013-06-28 NOTE — Anesthesia Procedure Notes (Signed)
Procedure Name: Intubation Date/Time: 06/28/2013 2:16 PM Performed by: Maude Leriche DOBSON Pre-anesthesia Checklist: Patient identified, Emergency Drugs available, Suction available, Patient being monitored and Timeout performed Patient Re-evaluated:Patient Re-evaluated prior to inductionOxygen Delivery Method: Circle system utilized Preoxygenation: Pre-oxygenation with 100% oxygen Intubation Type: IV induction Ventilation: Mask ventilation without difficulty Laryngoscope Size: Miller and 2 (attempt x 2 with  miller 2. esophageal intubation with first attempt and no attempt to place ETT withsecond attempt.  Thrid attempt with Glide) Grade View: Grade I Tube type: Oral Tube size: 7.5 mm Number of attempts: 3 (see note above. Grade 3 view with miller 2. grade q with Glidescope) Airway Equipment and Method: Stylet and Video-laryngoscopy Placement Confirmation: ETT inserted through vocal cords under direct vision,  positive ETCO2 and breath sounds checked- equal and bilateral Secured at: 22 cm Tube secured with: Tape Dental Injury: Teeth and Oropharynx as per pre-operative assessment

## 2013-06-28 NOTE — Op Note (Signed)
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  EBL: Minimal  History of present illness: Patient is a very pleasant 72 year old female who is a progress worsening back and right hip and leg pain last several months pain radiates to her right hip down the outside posterior aspect of right thigh to the outside was dressed the Also wrapping around the distal of her shin this was consistent with an L4 and L5 nerve root pattern and workup revealed lateral recess stenosis lumbar spondylosis and lumbar stenosis at L4-5 patient failed all forms of conservative treatment with epidural steroid injections anti-inflammatories physical therapy and time in size subsequently recommended a decompressive laminectomy at L4-5 on the right decompressing the right L4 and L5 nerve root. I extensively reviewed the risks and benefits of the operation as well as perioperative course and expectations of outcome alternatives of surgery she understood and agreed to proceed forward.  Operative procedure: Patient brought into the or was induced under general anesthesia positioned prone the Wilson frame the back was prepped and draped in routine sterile fashion preoperative x-ray localize the appropriate level so after infiltration of 10 cc lidocaine with epi a midline incision was made and Bovie light cautery was used to take down the subcutaneous tissues and subperiosteal dissections care lamina of L4 and L5 on the right side. Interoperative X. identify the appropriate level so than the virtually the entire lamina of L4 was drilled away and the  medial facet complexes suppressant of L5 then laminotomy was begun with a 3 mm Kerrison punch the ligament of flavum was identified and removed in piecemeal fashion exposing the thecal sac. Under microscopic illumination the thecal sac was further dissected off of the medial facet complex and this was under been with a 20 minute Kerrison punch. Using a small drill bit the inferior aspect of the pars and superior aspect facet complex was then drilled such that I can gain access and exposure to the L4 pedicle and the L4 nerve root was identified and I unroofed a large spur coming off the superior tickling facet going up and the undersurface of the L4 nerve root. After under biting this and under biting further medial canal and decompress the proximal L5 nerve root as well. Marking inferiorly I unroofed the L5 neuroforamen further decompress the L5 nerve root. I then explored the L4 nerve neuroforamen as well as the L5 neuroforamen with a hockey stick and coronary dilator no further stenosis was appreciated. Directly visualizing both the L4 and L5 nerve root as well as the disc space I confirmed adequate decompression. The disc space was not herniated and was not felt to be contributing to the stenosis was a left this alone. The wounds and copiously irrigated meticulous hemostasis was maintained Gelfoam was laid up the dura the muscle fascia proximal in layers with after Vicryl and the skin was closed running 4 subcuticular benzoin and Steri-Strip were applied patient recovered in stable condition. At the end of case on it counts sponge counts were correct.

## 2013-06-29 MED ORDER — OXYCODONE-ACETAMINOPHEN 5-325 MG PO TABS: 1.0000 | ORAL_TABLET | ORAL | Status: DC | PRN

## 2013-06-29 MED ORDER — CYCLOBENZAPRINE HCL 10 MG PO TABS: 10.0000 mg | ORAL_TABLET | Freq: Three times a day (TID) | ORAL | Status: DC | PRN

## 2013-06-29 NOTE — Evaluation (Signed)
Occupational Therapy Evaluation and Discharge Summary Patient Details Name: Lindsey Shaw MRN: [id number] DOB: [birth year] Today's Date: 06/29/2013 Time: [id number] OT Time Calculation (min): 10 min  OT Assessment / Plan / Recommendation History of present illness Patient is a 72 y/o female admitted due to lumbar spinal stenosis with right radiculopathy s/p L4-5 decompressive laminectomy.   Clinical Impression   Pt doing well post back surgery.  ADL techniques reviewed and pt is being d/c'd at this time.  No further OT or equipment needs at this time.    OT Assessment  Patient does not need any further OT services    Follow Up Recommendations  No OT follow up    Barriers to Discharge      Equipment Recommendations  None recommended by OT    Recommendations for Other Services    Frequency       Precautions / Restrictions Precautions Precautions: Back Precaution Booklet Issued: No Precaution Comments: Educated in back precautions Restrictions Weight Bearing Restrictions: No   Pertinent Vitals/Pain Pt complaining of minor back soreness.    ADL  Eating/Feeding: Simulated;Independent Where Assessed - Eating/Feeding: Chair Grooming: Simulated;Independent Where Assessed - Grooming: Unsupported standing Upper Body Bathing: Performed;Set up Where Assessed - Upper Body Bathing: Unsupported sitting Lower Body Bathing: Simulated;Supervision/safety Where Assessed - Lower Body Bathing: Unsupported sit to stand Upper Body Dressing: Simulated;Set up Where Assessed - Upper Body Dressing: Unsupported sitting Lower Body Dressing: Simulated;Set up Where Assessed - Lower Body Dressing: Unsupported sit to stand Toilet Transfer: Performed;Modified independent Toilet Transfer Method: Other (comment) (ambulation) Toilet Transfer Equipment: Comfort height toilet Toileting - Clothing Manipulation and Hygiene: Performed;Modified independent Where Assessed - Toileting Clothing Manipulation and  Hygiene: Standing Transfers/Ambulation Related to ADLs: Pt walked in room [place]. ADL Comments: Pt able to cross legs to dress LE.  Explained need to do this or use step stool instead of reaching to the floor to maintain back precautions.    OT Diagnosis:    OT Problem List:   OT Treatment Interventions:     OT Goals(Current goals can be found in the care plan section) Acute Rehab OT Goals Patient Stated Goal: to be I again.  Visit Information  Last OT Received On: 06/29/13 Assistance Needed: +1 PT/OT Co-Evaluation/Treatment: Yes History of Present Illness: Patient is a 72 y/o female admitted due to lumbar spinal stenosis with right radiculopathy s/p L4-5 decompressive laminectomy.       Prior [place] expects to be discharged to:: Private residence Living Arrangements: Children Available Help at Discharge: Available 24 hours/day;Family Type of Home: House Home Access: Stairs to enter [employer] of Steps: 4 Entrance Stairs-Rails: Left Home Layout: One level Home Equipment: [place] - 2 wheels;Bedside commode;Cane - quad;Shower seat Prior Function Level of Independence: Independent Communication Communication: No difficulties Dominant Hand: Right         Vision/Perception Vision - History Baseline Vision: Wears glasses for distance only Patient Visual Report: No change from baseline Vision - Assessment Eye Alignment: Within Functional Limits Vision Assessment: Vision not tested   Cognition  Cognition Arousal/Alertness: Awake/alert Behavior During Therapy: WFL for tasks assessed/performed Overall Cognitive Status: Within Functional Limits for tasks assessed    Extremity/Trunk Assessment Upper Extremity Assessment Upper Extremity Assessment: Overall WFL for tasks assessed Lower Extremity Assessment Lower Extremity Assessment: Overall WFL for tasks assessed Cervical / Trunk Assessment Cervical / Trunk Assessment:  Normal     Mobility Bed Mobility Bed Mobility: Sit to Sidelying Left;Rolling Left;Left Sidelying  to Sit Rolling Left: 5: Supervision Left Sidelying to Sit: 5: Supervision;HOB flat Sit to Sidelying Left: 5: Supervision;HOB flat Details for Bed Mobility Assistance: cues for technique for maintaining precautions Transfers Transfers: Sit to Stand;Stand to Sit Sit to Stand: 6: Modified independent (Device/Increase time);From bed Stand to Sit: 6: Modified independent (Device/Increase time);To chair/3-in-1 Details for Transfer Assistance: demonstrated and educated in car transfers as well with maintaining back precautions     Exercise     Balance     End of Session OT - End of Session Activity Tolerance: Patient tolerated treatment well Patient left: Other (comment) (in w/c being transported to go home.) Nurse Communication: Mobility status  GO     Glenford Peers 06/29/2013, 10:26 AM [phone]

## 2013-06-29 NOTE — Evaluation (Signed)
Physical Therapy Evaluation Patient Details Name: TERRAL ZUCCARELLI MRN: [id number] DOB: 09-17-1941 Today's Date: 06/29/2013 Time: [id number] PT Time Calculation (min): 11 min  PT Assessment / Plan / Recommendation History of Present Illness  Patient is a 72 y/o female admitted due to lumbar spinal stenosis with right radiculopathy s/p L4-5 decompressive laminectomy.  Clinical Impression  Patient demonstrates understanding of education.  Stable for d/c home with daughter and son to provide initial 24 hour assist.  No follow up PT needs.  Will d/c PT.    PT Assessment  Patent does not need any further PT services    Follow Up Recommendations  No PT follow up          Equipment Recommendations  None recommended by PT          Precautions / Restrictions Precautions Precautions: Back Precaution Booklet Issued: No Precaution Comments: Educated in back precautions Restrictions Weight Bearing Restrictions: No   Pertinent Vitals/Pain Min c/o back soreness      Mobility  Bed Mobility Bed Mobility: Sit to Sidelying Left;Rolling Left;Left Sidelying to Sit Rolling Left: 5: Supervision Left Sidelying to Sit: 5: Supervision;HOB flat Sit to Sidelying Left: 5: Supervision;HOB flat Details for Bed Mobility Assistance: cues for technique for maintaining precautions Transfers Transfers: Sit to Stand;Stand to Sit Sit to Stand: 6: Modified independent (Device/Increase time);From bed Stand to Sit: 6: Modified independent (Device/Increase time);To chair/3-in-1 Details for Transfer Assistance: demonstrated and educated in car transfers as well with maintaining back precautions Ambulation/Gait Ambulation/Gait Assistance: 5: Supervision;4: Min guard Ambulation Distance (Feet): 12 Feet Assistive device: None Ambulation/Gait Assistance Details: antalgic on left and reaching for wall.  States walked full hallway earlier holding walls.  Educated in fall risk reaching for walls and furniture and  need to use rolling walker at home initially until feels fine without holding on.  Educated in height of walker. Gait Pattern: Antalgic;Wide base of support Stairs: No (educated in using step to technique)       PT Goals(Current goals can be found in the care plan section) Acute Rehab PT Goals PT Goal Formulation: No goals set, d/c therapy  Visit Information  Last PT Received On: 06/29/13 Assistance Needed: +1 PT/OT Co-Evaluation/Treatment: Yes History of Present Illness: Patient is a 72 y/o female admitted due to lumbar spinal stenosis with right radiculopathy s/p L4-5 decompressive laminectomy.       Prior [place] expects to be discharged to:: Private residence Living Arrangements: Children Available Help at Discharge: Available 24 hours/day;Family (initial 24 hour assist) Type of Home: House Home Access: Stairs to enter [employer] of Steps: 4 Entrance Stairs-Rails: Left Home Layout: One level Home Equipment: [place] - 2 wheels;Bedside commode;Cane - quad;Shower seat Prior Function Level of Independence: Independent Communication Communication: No difficulties Dominant Hand: Right    Cognition  Cognition Arousal/Alertness: Awake/alert Behavior During Therapy: WFL for tasks assessed/performed Overall Cognitive Status: Within Functional Limits for tasks assessed    Extremity/Trunk Assessment Upper Extremity Assessment Upper Extremity Assessment: Defer to OT evaluation Lower Extremity Assessment Lower Extremity Assessment: Overall WFL for tasks assessed Cervical / Trunk Assessment Cervical / Trunk Assessment: Normal      End of Session PT - End of Session Activity Tolerance: Patient tolerated treatment well Patient left: in chair;with family/visitor present (in wheelchair for transport out for discharge)  GP Functional Assessment Tool Used: Clinical observation Functional Limitation: Mobility: Walking and moving  around Mobility: Walking and Moving Around Current Status [id number]): At least 1 percent but  less than 20 percent impaired, limited or restricted Mobility: Walking and Moving Around Goal Status [phone]): At least 1 percent but less than 20 percent impaired, limited or restricted Mobility: Walking and Moving Around Discharge Status [phone]): At least 1 percent but less than 20 percent impaired, limited or restricted   [facility] 06/29/2013, 10:00 AM  Magda Kiel, [place] 06/29/2013

## 2013-06-29 NOTE — Progress Notes (Signed)
Pt given D/C instructions with Rx's, verbal understanding given. Pt D/C'd home via wheelchair @ 0920 per MD order. Holli Humbles, RN

## 2013-06-29 NOTE — Progress Notes (Signed)
Patient ID: Lindsey Shaw, female   DOB: Mar 11, 1941, 72 y.o.   MRN: [id number] Patient grade no leg pain very middle back pain wound dry discharge home

## 2013-06-29 NOTE — Discharge Summary (Signed)
  Physician Discharge Summary  Patient ID: Lindsey Shaw MRN: [id number] DOB/AGE: 70-Aug-1942 70 y.o.  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 *   Discharged Condition: fair  Hospital Course:  This is been hospital underwent decompressive laminectomy at L4-5 postoperatively patient did very well recovered in the floor on the floor she was convalescing well ambulating and voiding spontaneously pain was well-controlled J. Complete resolution of her preoperative radicular symptoms. Her wound is clean and dry and she's still be discharged home.  Consults: Significant Diagnostic Studies: Treatments: Decompressive lumbar laminectomy L4-5 Discharge Exam: Blood pressure 116/71, pulse 68, temperature 99.1 F (37.3 C), temperature source Oral, resp. rate 18, SpO2 99.00%. Wound clean and dry discharge home  Disposition: Home     Medication List         aspirin EC 81 MG tablet  Take 81 mg by mouth daily.     citalopram 20 MG tablet  Commonly known as:  CELEXA  Take 20 mg by mouth every evening.     colchicine 0.6 MG tablet  Take 0.6 mg by mouth daily as needed (gout).     cyclobenzaprine 10 MG tablet  Commonly known as:  FLEXERIL  Take 1 tablet (10 mg total) by mouth 3 (three) times daily as needed for muscle spasms.     losartan-hydrochlorothiazide 100-25 MG per tablet  Commonly known as:  HYZAAR  Take 1 tablet by mouth daily.     lovastatin 40 MG tablet  Commonly known as:  MEVACOR  Take 40 mg by mouth at bedtime.     metformin 500 MG (OSM) 24 hr tablet  Commonly known as:  FORTAMET  Take 500 mg by mouth every evening.     omeprazole 20 MG capsule  Commonly known as:  PRILOSEC  Take 20 mg by mouth every evening.     oxyCODONE-acetaminophen 5-325 MG per tablet  Commonly  known as:  PERCOCET/ROXICET  Take 0.5-1 tablets by mouth every 4 (four) hours as needed for pain.     oxyCODONE-acetaminophen 5-325 MG per tablet  Commonly known as:  PERCOCET/ROXICET  Take 1-2 tablets by mouth every 4 (four) hours as needed.           Follow-up Information   Follow up with Murphy Bundick P, MD.   Contact information:   [address] [place] [address] [phone]       Signed: Kaveri Perras P 06/29/2013, 6:58 AM

## 2013-07-01 ENCOUNTER — Encounter (HOSPITAL_COMMUNITY): Payer: Self-pay | Admitting: Neurosurgery

## 2013-12-20 ENCOUNTER — Ambulatory Visit: Payer: Self-pay | Admitting: Internal Medicine

## 2014-04-18 ENCOUNTER — Emergency Department: Payer: Self-pay | Admitting: Emergency Medicine

## 2014-04-18 LAB — CBC WITH DIFFERENTIAL/PLATELET
BASOS ABS: 0 10*3/uL (ref 0.0–0.1)
Basophil %: 0.1 %
Eosinophil #: 0 10*3/uL (ref 0.0–0.7)
Eosinophil %: 0.3 %
HCT: 34.9 % — ABNORMAL LOW (ref 35.0–47.0)
HGB: 11.8 g/dL — ABNORMAL LOW (ref 12.0–16.0)
LYMPHS PCT: 20.8 %
Lymphocyte #: 1.9 10*3/uL (ref 1.0–3.6)
MCH: 31.2 pg (ref 26.0–34.0)
MCHC: 33.7 g/dL (ref 32.0–36.0)
MCV: 93 fL (ref 80–100)
Monocyte #: 0.3 x10 3/mm (ref 0.2–0.9)
Monocyte %: 3.7 %
Neutrophil #: 7 10*3/uL — ABNORMAL HIGH (ref 1.4–6.5)
Neutrophil %: 75.1 %
Platelet: 216 10*3/uL (ref 150–440)
RBC: 3.76 10*6/uL — AB (ref 3.80–5.20)
RDW: 12.7 % (ref 11.5–14.5)
WBC: 9.3 10*3/uL (ref 3.6–11.0)

## 2014-04-18 LAB — COMPREHENSIVE METABOLIC PANEL WITH GFR
Albumin: 4.2 g/dL
Alkaline Phosphatase: 93 U/L
Anion Gap: 6 — ABNORMAL LOW
BUN: 22 mg/dL — ABNORMAL HIGH
Bilirubin,Total: 0.3 mg/dL
Calcium, Total: 9.2 mg/dL
Chloride: 100 mmol/L
Co2: 28 mmol/L
Creatinine: 1.53 mg/dL — ABNORMAL HIGH
EGFR (African American): 40 — ABNORMAL LOW
EGFR (Non-African Amer.): 34 — ABNORMAL LOW
Glucose: 116 mg/dL — ABNORMAL HIGH
Osmolality: 273
Potassium: 3.7 mmol/L
SGOT(AST): 29 U/L
SGPT (ALT): 18 U/L
Sodium: 134 mmol/L — ABNORMAL LOW
Total Protein: 8 g/dL

## 2014-04-24 ENCOUNTER — Emergency Department: Payer: Self-pay | Admitting: Emergency Medicine

## 2014-04-24 LAB — CBC
HCT: 34.7 % — ABNORMAL LOW (ref 35.0–47.0)
HGB: 11.8 g/dL — ABNORMAL LOW (ref 12.0–16.0)
MCH: 31.5 pg (ref 26.0–34.0)
MCHC: 34.1 g/dL (ref 32.0–36.0)
MCV: 92 fL (ref 80–100)
PLATELETS: 246 10*3/uL (ref 150–440)
RBC: 3.76 10*6/uL — AB (ref 3.80–5.20)
RDW: 12.5 % (ref 11.5–14.5)
WBC: 8.9 10*3/uL (ref 3.6–11.0)

## 2014-04-24 LAB — COMPREHENSIVE METABOLIC PANEL
ALBUMIN: 3.7 g/dL (ref 3.4–5.0)
ALK PHOS: 81 U/L
ALT: 19 U/L (ref 12–78)
ANION GAP: 7 (ref 7–16)
BUN: 26 mg/dL — AB (ref 7–18)
Bilirubin,Total: 0.3 mg/dL (ref 0.2–1.0)
CALCIUM: 9.3 mg/dL (ref 8.5–10.1)
CO2: 30 mmol/L (ref 21–32)
CREATININE: 1.29 mg/dL (ref 0.60–1.30)
Chloride: 99 mmol/L (ref 98–107)
GFR CALC AF AMER: 49 — AB
GFR CALC NON AF AMER: 42 — AB
GLUCOSE: 137 mg/dL — AB (ref 65–99)
Osmolality: 279 (ref 275–301)
POTASSIUM: 3.6 mmol/L (ref 3.5–5.1)
SGOT(AST): 13 U/L — ABNORMAL LOW (ref 15–37)
Sodium: 136 mmol/L (ref 136–145)
TOTAL PROTEIN: 7.7 g/dL (ref 6.4–8.2)

## 2014-04-24 LAB — PROTIME-INR
INR: 0.8
Prothrombin Time: 11.2 secs — ABNORMAL LOW (ref 11.5–14.7)

## 2014-05-23 ENCOUNTER — Ambulatory Visit: Payer: Self-pay | Admitting: Unknown Physician Specialty

## 2014-12-22 ENCOUNTER — Ambulatory Visit: Payer: Self-pay | Admitting: Internal Medicine

## 2015-03-23 ENCOUNTER — Emergency Department
Admit: 2015-03-23 | Disposition: A | Discharge status: Home / Self Care | Payer: Self-pay | Admitting: Emergency Medicine

## 2015-04-09 NOTE — H&P (Signed)
PATIENT NAME:  Lindsey Shaw, Lindsey Shaw MR#:  [id number] DATE OF BIRTH:  [birth year]  DATE OF ADMISSION:  06/22/2012  PRIMARY CARE PHYSICIAN: Dr. Frazier Richards  CHIEF COMPLAINT: Chest pain.   HISTORY OF PRESENT ILLNESS: Lindsey Shaw is a 74 year old pleasant Caucasian female with history of hypertension, diabetes and hypercholesterolemia. She was doing well until today and the story is that she got very upset about Medicare insurance coverage that denied PET scan that was done for her husband who is now deceased. She tells me that the hospital had called collecting agency prematurely that will ruin their credit. Subsequently, she developed chest pain located at the left side of the chest radiating to the left shoulder. The severity was 10 on a scale of 10. The pain described as sharp pain that comes and goes. It lasts about one minute then subsides to recur again. There is no associated nausea or vomiting. No shortness of breath. Patient indicates that she had prior heart attack but this appears to be doubtful. Reports that in 2007 prior to her back surgery at [facility] they did EKG and they found she had previous MI or possibility of MI. Then she had stress test but stress test was fine. The current EKG now does not show any prior myocardial infarction.   REVIEW OF SYSTEMS: CONSTITUTIONAL: Denies any fever. No chills. No fatigue. EYES: No blurring of vision. No double vision. ENT: No hearing impairment. No sore throat. No dysphagia. CARDIOVASCULAR: Reports chest pain as above. No shortness of breath. No edema. No syncope. RESPIRATORY: No cough. No sputum production. No hemoptysis but reports the chest pain. GASTROINTESTINAL: No abdominal pain. No vomiting. No diarrhea. GENITOURINARY: No dysuria. No frequency of urination. MUSCULOSKELETAL: No joint swelling or pain. No muscular pain or swelling. INTEGUMENTARY: No skin rash. No ulcers. NEUROLOGY: No focal weakness. No seizure activity. No headache. PSYCHIATRY: No  anxiety. No depression. ENDOCRINE: No heat or cold intolerance. No polyuria or polydipsia.   PAST MEDICAL HISTORY:  1. Systemic hypertension. 2. Diabetes mellitus, type 2 non-insulin-dependent. 3. Hypercholesterolemia. 4. Gout. 5. Possible coronary disease with questionable MI in the past that was not documented.   PAST SURGICAL HISTORY:  1. Cholecystectomy.  2. Tubal ligation. 3. Foot surgery for ingrowing toenail.   FAMILY HISTORY: Her father died from complications of lung cancer. Her mother died from pancreatic cancer. There is no family history of diabetes.   SOCIAL HABITS: Nonsmoker without history of smoking. No history of alcoholism.   SOCIAL HISTORY: She is widowed. Her husband died in 01/14/2023. She lives with her granddaughter.   ADMISSION MEDICATIONS:  1. Omeprazole 20 mg once a day. 2. Metformin 500 mg once a day. 3. Lovastatin 40 mg once a day. 4. Hydrochlorothiazide 25 mg a day.  5. Losartan 100 mg a day.  6. Colcrys 0.6 mg once a day, although she may take it p.r.n. for gout.  7. Citalopram 20 mg once a day.  8. Aspirin 81 mg a day.   ALLERGIES: No known drug allergies.   PHYSICAL EXAMINATION:  VITAL SIGNS: Blood pressure 142/68, respiratory rate 20, pulse 65, oxygen saturation 99%.   GENERAL APPEARANCE: Elderly female lying in bed in no acute distress, looks comfortable.   HEAD: No pallor. No icterus. No cyanosis.   ENT: Hearing was normal. Nasal mucosa, lips, tongue were normal.   EYES: Normal eyelids and conjunctivae. Pupils about 5 mm, equal and reactive to light.   NECK: Supple. Trachea at midline. No thyromegaly. No cervical lymphadenopathy.  No masses.   HEART: Normal S1, S2. No S3 or S4. No murmur. No gallop. No carotid bruits.   RESPIRATORY: Normal breathing pattern without use of accessory muscles. No rales. No wheezing.   ABDOMEN: Soft without tenderness. No hepatosplenomegaly. No masses. No hernias.   SKIN: No ulcers. No subcutaneous  nodules.   MUSCULOSKELETAL: No joint swelling. No clubbing.   NEUROLOGIC: Cranial nerves II through XII are intact. No focal motor deficit.   PSYCHIATRY: Patient is alert, oriented x3. Mood and affect were normal.   LABORATORY, DIAGNOSTIC, AND RADIOLOGICAL DATA: EKG showed normal sinus rhythm at rate of 65 per minute, unremarkable EKG. Chest x-ray showed heart size at upper limits of normal. No consolidation, no effusion. No acute cardiopulmonary abnormality. Serum glucose 191, BUN 25, creatinine 1.4, sodium 140, potassium was not done, calcium 8.8. Liver function tests were normal. Total CPK 107. Troponin less than 0.02. CBC showed white count 6000, hemoglobin 10.8, hematocrit 32, platelet count 213, indicis were normal. MCV 93, MCH 30, MCHC 33.   ASSESSMENT:  1. Recurrent atypical chest pain.  2. Diabetes mellitus type 2.  3. Systemic hypertension.  4. Hypercholesterolemia.  5. Normocytic, normochromic anemia.  6. Gout by history.   PLAN: Will admit the patient for observation over telemetry monitoring. Follow up on cardiac enzymes. Increase aspirin from 81 mg to 325 mg a day. Continue the rest of home medications as above. Repeat EKG in the morning. Schedule the patient to have stress test in the morning. Patient needs outpatient work-up for her anemia. Patient indicates that she has a LIVING WILL and she had appointed her daughter, Lindsey Shaw, to have the power of attorney.     TIME SPENT IN EVALUATING THIS PATIENT: Took more than 55 minutes.   ____________________________ Clovis Pu. Lenore Manner, MD amd:cms D: 06/22/2012 22:52:47 ET T: 06/23/2012 07:10:40 ET JOB#: [id number]  cc: Clovis Pu. Lenore Manner, MD, <Dictator> Ocie Cornfield. Ouida Sills, MD Clovis Pu Wilmore MD ELECTRONICALLY SIGNED 06/25/2012 1:33

## 2015-10-20 ENCOUNTER — Encounter: Payer: Self-pay | Admitting: Emergency Medicine

## 2015-10-20 ENCOUNTER — Observation Stay
Admission: EM | Admit: 2015-10-20 | Discharge: 2015-10-22 | Disposition: A | Discharge status: Home Health | Payer: Medicare Other | Attending: Internal Medicine | Admitting: Internal Medicine

## 2015-10-20 ENCOUNTER — Emergency Department: Payer: Medicare Other

## 2015-10-20 DIAGNOSIS — K219 Gastro-esophageal reflux disease without esophagitis: Secondary | ICD-10-CM | POA: Diagnosis not present

## 2015-10-20 DIAGNOSIS — E876 Hypokalemia: Secondary | ICD-10-CM | POA: Diagnosis not present

## 2015-10-20 DIAGNOSIS — M549 Dorsalgia, unspecified: Secondary | ICD-10-CM | POA: Diagnosis not present

## 2015-10-20 DIAGNOSIS — S22080A Wedge compression fracture of T11-T12 vertebra, initial encounter for closed fracture: Secondary | ICD-10-CM

## 2015-10-20 DIAGNOSIS — Z881 Allergy status to other antibiotic agents status: Secondary | ICD-10-CM | POA: Diagnosis not present

## 2015-10-20 DIAGNOSIS — N179 Acute kidney failure, unspecified: Secondary | ICD-10-CM | POA: Diagnosis not present

## 2015-10-20 DIAGNOSIS — S22089A Unspecified fracture of T11-T12 vertebra, initial encounter for closed fracture: Principal | ICD-10-CM | POA: Insufficient documentation

## 2015-10-20 DIAGNOSIS — D689 Coagulation defect, unspecified: Secondary | ICD-10-CM | POA: Diagnosis not present

## 2015-10-20 DIAGNOSIS — E119 Type 2 diabetes mellitus without complications: Secondary | ICD-10-CM | POA: Insufficient documentation

## 2015-10-20 DIAGNOSIS — M545 Low back pain: Secondary | ICD-10-CM | POA: Diagnosis not present

## 2015-10-20 DIAGNOSIS — Z9049 Acquired absence of other specified parts of digestive tract: Secondary | ICD-10-CM | POA: Diagnosis not present

## 2015-10-20 DIAGNOSIS — F329 Major depressive disorder, single episode, unspecified: Secondary | ICD-10-CM | POA: Insufficient documentation

## 2015-10-20 DIAGNOSIS — Y929 Unspecified place or not applicable: Secondary | ICD-10-CM | POA: Diagnosis not present

## 2015-10-20 DIAGNOSIS — Z79899 Other long term (current) drug therapy: Secondary | ICD-10-CM | POA: Diagnosis not present

## 2015-10-20 DIAGNOSIS — I252 Old myocardial infarction: Secondary | ICD-10-CM | POA: Diagnosis not present

## 2015-10-20 DIAGNOSIS — Z85820 Personal history of malignant melanoma of skin: Secondary | ICD-10-CM | POA: Diagnosis not present

## 2015-10-20 DIAGNOSIS — Y999 Unspecified external cause status: Secondary | ICD-10-CM | POA: Insufficient documentation

## 2015-10-20 DIAGNOSIS — Y939 Activity, unspecified: Secondary | ICD-10-CM | POA: Insufficient documentation

## 2015-10-20 DIAGNOSIS — M4854XA Collapsed vertebra, not elsewhere classified, thoracic region, initial encounter for fracture: Secondary | ICD-10-CM | POA: Diagnosis present

## 2015-10-20 DIAGNOSIS — E860 Dehydration: Secondary | ICD-10-CM | POA: Insufficient documentation

## 2015-10-20 DIAGNOSIS — Z7984 Long term (current) use of oral hypoglycemic drugs: Secondary | ICD-10-CM | POA: Diagnosis not present

## 2015-10-20 DIAGNOSIS — Z7982 Long term (current) use of aspirin: Secondary | ICD-10-CM | POA: Insufficient documentation

## 2015-10-20 DIAGNOSIS — W19XXXA Unspecified fall, initial encounter: Secondary | ICD-10-CM | POA: Diagnosis not present

## 2015-10-20 DIAGNOSIS — I10 Essential (primary) hypertension: Secondary | ICD-10-CM | POA: Diagnosis not present

## 2015-10-20 DIAGNOSIS — R52 Pain, unspecified: Secondary | ICD-10-CM | POA: Diagnosis present

## 2015-10-20 DIAGNOSIS — M47814 Spondylosis without myelopathy or radiculopathy, thoracic region: Secondary | ICD-10-CM | POA: Diagnosis not present

## 2015-10-20 LAB — BASIC METABOLIC PANEL
Anion gap: 6 (ref 5–15)
BUN: 29 mg/dL — AB (ref 6–20)
CALCIUM: 8.9 mg/dL (ref 8.9–10.3)
CHLORIDE: 103 mmol/L (ref 101–111)
CO2: 27 mmol/L (ref 22–32)
CREATININE: 1.48 mg/dL — AB (ref 0.44–1.00)
GFR calc non Af Amer: 34 mL/min — ABNORMAL LOW (ref >60)
GFR, EST AFRICAN AMERICAN: 40 mL/min — AB (ref >60)
Glucose, Bld: 160 mg/dL — ABNORMAL HIGH (ref 65–99)
Potassium: 3.3 mmol/L — ABNORMAL LOW (ref 3.5–5.1)
Sodium: 136 mmol/L (ref 135–145)

## 2015-10-20 LAB — CBC WITH DIFFERENTIAL/PLATELET
BASOS PCT: 0 %
Basophils Absolute: 0 10*3/uL (ref 0–0.1)
EOS ABS: 0.1 10*3/uL (ref 0–0.7)
Eosinophils Relative: 1 %
HEMATOCRIT: 30.7 % — AB (ref 35.0–47.0)
HEMOGLOBIN: 10.2 g/dL — AB (ref 12.0–16.0)
LYMPHS ABS: 2.4 10*3/uL (ref 1.0–3.6)
LYMPHS PCT: 25 %
MCH: 30.6 pg (ref 26.0–34.0)
MCHC: 33.4 g/dL (ref 32.0–36.0)
MCV: 91.6 fL (ref 80.0–100.0)
MONO ABS: 0.4 10*3/uL (ref 0.2–0.9)
MONOS PCT: 4 %
Neutro Abs: 6.7 10*3/uL — ABNORMAL HIGH (ref 1.4–6.5)
Neutrophils Relative %: 70 %
Platelets: 181 10*3/uL (ref 150–440)
RBC: 3.35 MIL/uL — ABNORMAL LOW (ref 3.80–5.20)
RDW: 12.7 % (ref 11.5–14.5)
WBC: 9.6 10*3/uL (ref 3.6–11.0)

## 2015-10-20 MED ORDER — OXYCODONE-ACETAMINOPHEN 5-325 MG PO TABS
2.0000 | ORAL_TABLET | Freq: Once | ORAL | Status: AC
  Administered 2015-10-20: 2 via ORAL
  Filled 2015-10-20: qty 2

## 2015-10-20 MED ORDER — DOCUSATE SODIUM 100 MG PO CAPS: ORAL_CAPSULE | ORAL | Status: DC

## 2015-10-20 MED ORDER — OXYCODONE-ACETAMINOPHEN 5-325 MG PO TABS: 1.0000 | ORAL_TABLET | ORAL | Status: DC | PRN

## 2015-10-20 MED ORDER — ONDANSETRON HCL 4 MG/2ML IJ SOLN
4.0000 mg | INTRAMUSCULAR | Status: AC
  Administered 2015-10-20: 4 mg via INTRAVENOUS
  Filled 2015-10-20: qty 2

## 2015-10-20 MED ORDER — MORPHINE SULFATE (PF) 4 MG/ML IV SOLN
4.0000 mg | Freq: Once | INTRAVENOUS | Status: AC
  Administered 2015-10-20: 4 mg via INTRAVENOUS
  Filled 2015-10-20: qty 1

## 2015-10-20 NOTE — ED Notes (Signed)
Pt. States she fell from a standing position in kitchen.  Pt. Denies LOC.  Pt. States pain in lower lumbar region.  Pt. States hx of back surgeries, the last in 2014 for removal on bone spurs.  Pt. States she also hit back of head.  No laceration noted.

## 2015-10-20 NOTE — ED Notes (Signed)
Pt O2 sensor read at 85%.  Switched to new sensor.  O2 level now 98%.

## 2015-10-20 NOTE — ED Provider Notes (Signed)
-----------------------------------------   11:41 PM on 10/20/2015 -----------------------------------------  Patient with persistent severe pain despite morphine, unable to ambulate secondary to pain. Case discussed with hospitalist, Dr. Lavetta Nielsen, for admission at this time.  Joanne Gavel, MD 10/20/15 [phone]

## 2015-10-20 NOTE — ED Notes (Signed)
Pt. Here via EMS from home for fall.  Pt. States she slipped on floor.  Pt. States she slipped from a standing position.  Pt. States "I fell backwards on floor.  Pt. States hx of back surgeries.  Pt. States pain to lower lumbar area.  Pt. States she also hit the back of head.  Pt. Denies LOC.

## 2015-10-20 NOTE — ED Provider Notes (Signed)
[facility] Emergency Department Provider Note  ____________________________________________  Time seen: Approximately 9:03 PM  I have reviewed the triage vital signs and the nursing notes.   HISTORY  Chief Complaint Fall    HPI Lindsey Shaw is a 74 y.o. female with a past medical history of unspecified mental disorder, depression, diabetes, and prior lower back pain and surgery who presents by EMS for severe pain after a fall.  She describes a mechanical fall because of tripping over an object on the floor.  She fell from a standing position backwards and landed flat on her back and bumped her head on the oven in the kitchen.  She has no pain at this time in her head or neck.  There is severe pain with any motion and moderate pain at rest to her lower back.  She reports that this is the area in which she has had surgeries in the past.  She denies urinary retention, bowel incontinence, numbness or tingling in any of her extremities, chest pain, shortness of breath, abdominal pain.   Past Medical History  Diagnosis Date  . Mental disorder   . Depression   . Diabetes mellitus without complication ([place])   . Blood clotting tendency     2006   BLOOD CLOTT LEG TO GROIN  . GERD (gastroesophageal reflux disease)   . Cancer ([place])     MELANOMA   . Hypertension   . Myocardial infarction ([place])     10+ YRS (Lexiscan: no evid of ischemia/infarct, EF 72% 06/23/12 [facility])    There are no active problems to display for this patient.   Past Surgical History  Procedure Laterality Date  . Tubal ligation      73  . Toe surgery      BIL GREAT TOE JOINT REMOVED  . Cholecystectomy    . Back surgery      2007  . Lumbar laminectomy/decompression microdiscectomy Right 06/28/2013    Procedure: LUMBAR LAMINECTOMY/DECOMPRESSION MICRODISCECTOMY 1 LEVEL;  Surgeon: Elaina Hoops, MD;  Location: [place] [facility];  Service: Neurosurgery;  Laterality: Right;  Lumbar Laminectomy  Decompression Lumbar Four-Five Right    Current Outpatient Rx  Name  Route  Sig  Dispense  Refill  . aspirin EC 81 MG tablet   Oral   Take 81 mg by mouth daily.         . citalopram (CELEXA) 20 MG tablet   Oral   Take 20 mg by mouth every evening.         . colchicine 0.6 MG tablet   Oral   Take 0.6 mg by mouth daily as needed (gout).         . cyclobenzaprine (FLEXERIL) 10 MG tablet   Oral   Take 1 tablet (10 mg total) by mouth 3 (three) times daily as needed for muscle spasms.   40 tablet   1   . docusate sodium (COLACE) 100 MG capsule      Take 1 tablet once or twice daily as needed for constipation while taking narcotic pain medicine   30 capsule   0   . losartan-hydrochlorothiazide (HYZAAR) 100-25 MG per tablet   Oral   Take 1 tablet by mouth daily.         Marland Kitchen lovastatin (MEVACOR) 40 MG tablet   Oral   Take 40 mg by mouth at bedtime.         . metformin (FORTAMET) 500 MG (OSM) 24 hr tablet  Oral   Take 500 mg by mouth every evening.         Marland Kitchen omeprazole (PRILOSEC) 20 MG capsule   Oral   Take 20 mg by mouth every evening.         Marland Kitchen oxyCODONE-acetaminophen (ROXICET) 5-325 MG tablet   Oral   Take 1-2 tablets by mouth every 4 (four) hours as needed for severe pain.   40 tablet   0     Allergies Keflex  No family history on file.  Social History Social History  Substance Use Topics  . Smoking status: Never Smoker   . Smokeless tobacco: None  . Alcohol Use: No    Review of Systems Constitutional: No fever/chills Eyes: No visual changes. ENT: No sore throat. Cardiovascular: Denies chest pain. Respiratory: Denies shortness of breath. Gastrointestinal: No abdominal pain.  No nausea, no vomiting.  No diarrhea.  No constipation. Genitourinary: Negative for dysuria. Musculoskeletal: Pain in lower back. Skin: Negative for rash. Neurological: Negative for headaches, focal weakness or numbness.  10-point ROS otherwise  negative.  ____________________________________________   PHYSICAL EXAM:  VITAL SIGNS: ED Triage Vitals  Enc Vitals Group     BP 10/20/15 2054 151/67 mmHg     Pulse Rate 10/20/15 2054 66     Resp 10/20/15 2054 16     Temp 10/20/15 2054 97.7 F (36.5 C)     Temp Source 10/20/15 2054 Oral     SpO2 10/20/15 2054 96 %     Weight 10/20/15 2054 165 lb (74.844 kg)     Height 10/20/15 2054 5\' 5"  (1.651 m)     Head Cir --      Peak Flow --      Pain Score 10/20/15 2055 8     Pain Loc --      Pain Edu? --      Excl. in [place]? --     Constitutional: Alert and oriented.  Appears to be in pain. Eyes: Conjunctivae are normal. PERRL. EOMI. Head: Atraumatic.  No visible hematoma at the site on the back of her head that she says she struck when she fell. Nose: No congestion/rhinnorhea. Mouth/Throat: Mucous membranes are moist.  Oropharynx non-erythematous. Neck: No stridor.  No cervical spine tenderness to palpation. Cardiovascular: Normal rate, regular rhythm. Grossly normal heart sounds.  Good peripheral circulation. Respiratory: Normal respiratory effort.  No retractions. Lungs CTAB. Gastrointestinal: Soft and nontender. No distention. No abdominal bruits. No CVA tenderness. Musculoskeletal: Tenderness to palpation with no step-offs or deformities of the lower thoracic spine and throughout the lumbar spine. Neurologic:  Normal speech and language. No gross focal neurologic deficits are appreciated.  Skin:  Skin is warm, dry and intact. No rash noted. Psychiatric: Mood and affect are normal. Speech and behavior are normal.  ____________________________________________   LABS (all labs ordered are listed, but only abnormal results are displayed)  Labs Reviewed  CBC WITH DIFFERENTIAL/PLATELET  BASIC METABOLIC PANEL  URINALYSIS COMPLETEWITH MICROSCOPIC ([place])   ____________________________________________  EKG  ED ECG REPORT I, Margues Filippini, the attending physician,  personally viewed and interpreted this ECG.  Date: 10/20/2015 EKG Time: 20:50 Rate: 62 Rhythm: normal sinus rhythm QRS Axis: normal Intervals: normal ST/T Wave abnormalities: normal Conduction Disutrbances: none Narrative Interpretation: unremarkable  ____________________________________________  RADIOLOGY   Dg Thoracic Spine 2 View  10/20/2015  CLINICAL DATA:  Mid back pain after slipping on a floor and falling backwards today. EXAM: THORACIC SPINE 2 VIEWS COMPARISON:  Chest radiographs dated 06/21/2013. Lumbar  spine radiographs obtained today. FINDINGS: Minimal scoliosis. Multilevel degenerative changes. Interval approximately 20% T12 vertebral compression deformity with no visible bony retropulsion. IMPRESSION: 1. Interval 20% T12 vertebral compression deformity, possibly acute. 2. Mild thoracic spine degenerative changes. Electronically Signed   By: Claudie Revering M.D.   On: 10/20/2015 21:45   Dg Lumbar Spine 2-3 Views  10/20/2015  CLINICAL DATA:  Low right-sided back pain after slipping on floor and falling backwards today. EXAM: LUMBAR SPINE - 2-3 VIEW COMPARISON:  06/28/2013 FINDINGS: There is a mild compression fracture of T12, new from the prior exam, with a subtle fracture line along its anterior margin that appears acute. No other fractures. There is slight anterolisthesis of L4 on L5. There is mild loss of disc height L3-L4 with moderate loss of disc height at L4-L5 and L5-S1. IMPRESSION: Mild new compression fracture T12.  No other acute finding. Electronically Signed   By: Lajean Manes M.D.   On: 10/20/2015 21:43    ____________________________________________   PROCEDURES  Procedure(s) performed: None  Critical Care performed: No ____________________________________________   INITIAL IMPRESSION / ASSESSMENT AND PLAN / ED COURSE  Pertinent labs & imaging results that were available during my care of the patient were reviewed by me and considered in my medical  decision making (see chart for details).  The patient has no obvious deformities and is in severe pain but I suspect this is musculoskeletal strain rather than a fracture.  I will obtain plain films of the T and L spines.  She is not on any blood thinners and has no headache and no cervical spine pain or tenderness so I will not evaluated with CT scans at this time.  I am giving her Percocet 2 tablets by mouth for pain control until further evaluation is indicated.  ----------------------------------------- 10:08 PM on 10/20/2015 -----------------------------------------  Apparently acute small compression fracture of T12 with no retropulsion of fragments.  I spoke by phone with Dr. Daine Gip who is on-call for orthopedics.  He recommended that if the patient can go home and follow up in clinic, this is most likely ideal, it is unlikely that she would benefit from a TLSO brace at this time.  I spoke with the patient and explained these results.  She is still in severe pain with any kind of movement.  She may require admission for pain control.  I am putting in some basic labs and giving her a dose of IV morphine.  If she requires multiple doses, I am recommending admission for further management  I am transferring care of the patient to Dr. Edd Fabian to follow up on pain management and determine disposition.  ____________________________________________  FINAL CLINICAL IMPRESSION(S) / ED DIAGNOSES  Final diagnoses:  T12 compression fracture, initial encounter ([place])      NEW MEDICATIONS STARTED DURING THIS VISIT:  New Prescriptions   DOCUSATE SODIUM (COLACE) 100 MG CAPSULE    Take 1 tablet once or twice daily as needed for constipation while taking narcotic pain medicine   OXYCODONE-ACETAMINOPHEN (ROXICET) 5-325 MG TABLET    Take 1-2 tablets by mouth every 4 (four) hours as needed for severe pain.     Hinda Kehr, MD 10/20/15 2211

## 2015-10-20 NOTE — ED Notes (Signed)
Pt states "my legs feel weak".  Pt was asked if legs felt weak before the pain medication was administered and the patient said no.  Pt was asked if the same thing happened with the last dose of pain medication, and the patient stated "yes, but not as bad".

## 2015-10-21 DIAGNOSIS — R52 Pain, unspecified: Secondary | ICD-10-CM | POA: Diagnosis present

## 2015-10-21 DIAGNOSIS — S22089A Unspecified fracture of T11-T12 vertebra, initial encounter for closed fracture: Secondary | ICD-10-CM | POA: Diagnosis not present

## 2015-10-21 LAB — URINALYSIS COMPLETE WITH MICROSCOPIC (ARMC ONLY)
BACTERIA UA: NONE SEEN
Bilirubin Urine: NEGATIVE
Glucose, UA: NEGATIVE mg/dL
Hgb urine dipstick: NEGATIVE
KETONES UR: NEGATIVE mg/dL
Nitrite: NEGATIVE
PH: 5 (ref 5.0–8.0)
PROTEIN: NEGATIVE mg/dL
Specific Gravity, Urine: 1.014 (ref 1.005–1.030)

## 2015-10-21 LAB — GLUCOSE, CAPILLARY
Glucose-Capillary: 129 mg/dL — ABNORMAL HIGH (ref 65–99)
Glucose-Capillary: 153 mg/dL — ABNORMAL HIGH (ref 65–99)
Glucose-Capillary: 116 mg/dL — ABNORMAL HIGH (ref 65–99)
Glucose-Capillary: 145 mg/dL — ABNORMAL HIGH (ref 65–99)

## 2015-10-21 LAB — HEMOGLOBIN A1C: Hgb A1c MFr Bld: 7 % — ABNORMAL HIGH (ref 4.0–6.0)

## 2015-10-21 LAB — TSH: TSH: 1.931 u[IU]/mL (ref 0.350–4.500)

## 2015-10-21 MED ORDER — CITALOPRAM HYDROBROMIDE 20 MG PO TABS
20.0000 mg | ORAL_TABLET | Freq: Every evening | ORAL | Status: DC
  Administered 2015-10-21: 20 mg via ORAL
  Filled 2015-10-21: qty 1

## 2015-10-21 MED ORDER — MORPHINE SULFATE (PF) 2 MG/ML IV SOLN
2.0000 mg | INTRAVENOUS | Status: DC | PRN
  Administered 2015-10-21 (×2): 2 mg via INTRAVENOUS
  Filled 2015-10-21 (×2): qty 1

## 2015-10-21 MED ORDER — ASPIRIN EC 81 MG PO TBEC
81.0000 mg | DELAYED_RELEASE_TABLET | Freq: Every day | ORAL | Status: DC
  Administered 2015-10-21 – 2015-10-22 (×2): 81 mg via ORAL
  Filled 2015-10-21 (×2): qty 1

## 2015-10-21 MED ORDER — POTASSIUM CHLORIDE IN NACL 40-0.9 MEQ/L-% IV SOLN
INTRAVENOUS | Status: DC
  Administered 2015-10-21 (×2): 100 mL/h via INTRAVENOUS
  Filled 2015-10-21 (×4): qty 1000

## 2015-10-21 MED ORDER — PANTOPRAZOLE SODIUM 40 MG PO TBEC
40.0000 mg | DELAYED_RELEASE_TABLET | Freq: Every day | ORAL | Status: DC
  Administered 2015-10-21 – 2015-10-22 (×2): 40 mg via ORAL
  Filled 2015-10-21 (×2): qty 1

## 2015-10-21 MED ORDER — POTASSIUM CHLORIDE CRYS ER 20 MEQ PO TBCR
40.0000 meq | EXTENDED_RELEASE_TABLET | Freq: Two times a day (BID) | ORAL | Status: DC
  Administered 2015-10-21 – 2015-10-22 (×3): 40 meq via ORAL
  Filled 2015-10-21 (×3): qty 2

## 2015-10-21 MED ORDER — ACETAMINOPHEN 325 MG PO TABS: 650.0000 mg | ORAL_TABLET | Freq: Four times a day (QID) | ORAL | Status: DC | PRN

## 2015-10-21 MED ORDER — ONDANSETRON HCL 4 MG/2ML IJ SOLN: 4.0000 mg | Freq: Four times a day (QID) | INTRAMUSCULAR | Status: DC | PRN

## 2015-10-21 MED ORDER — DOCUSATE SODIUM 100 MG PO CAPS
100.0000 mg | ORAL_CAPSULE | Freq: Two times a day (BID) | ORAL | Status: DC
  Administered 2015-10-21 – 2015-10-22 (×4): 100 mg via ORAL
  Filled 2015-10-21 (×3): qty 1

## 2015-10-21 MED ORDER — INSULIN ASPART 100 UNIT/ML ~~LOC~~ SOLN
0.0000 [IU] | Freq: Three times a day (TID) | SUBCUTANEOUS | Status: DC
  Administered 2015-10-21: 2 [IU] via SUBCUTANEOUS
  Administered 2015-10-21: 1 [IU] via SUBCUTANEOUS
  Administered 2015-10-22: 2 [IU] via SUBCUTANEOUS
  Filled 2015-10-21: qty 2
  Filled 2015-10-21: qty 1

## 2015-10-21 MED ORDER — INSULIN ASPART 100 UNIT/ML ~~LOC~~ SOLN
0.0000 [IU] | Freq: Every day | SUBCUTANEOUS | Status: DC
  Filled 2015-10-21: qty 2

## 2015-10-21 MED ORDER — COLCHICINE 0.6 MG PO TABS: 0.6000 mg | ORAL_TABLET | Freq: Every day | ORAL | Status: DC | PRN

## 2015-10-21 MED ORDER — ACETAMINOPHEN 650 MG RE SUPP: 650.0000 mg | Freq: Four times a day (QID) | RECTAL | Status: DC | PRN

## 2015-10-21 MED ORDER — HYDROCHLOROTHIAZIDE 25 MG PO TABS
25.0000 mg | ORAL_TABLET | Freq: Every day | ORAL | Status: DC
  Administered 2015-10-21 – 2015-10-22 (×2): 25 mg via ORAL
  Filled 2015-10-21 (×2): qty 1

## 2015-10-21 MED ORDER — DOCUSATE SODIUM 100 MG PO CAPS
100.0000 mg | ORAL_CAPSULE | Freq: Every day | ORAL | Status: DC | PRN
  Filled 2015-10-21: qty 1

## 2015-10-21 MED ORDER — METFORMIN HCL 500 MG PO TABS: 500.0000 mg | ORAL_TABLET | Freq: Two times a day (BID) | ORAL | Status: DC

## 2015-10-21 MED ORDER — HEPARIN SODIUM (PORCINE) 5000 UNIT/ML IJ SOLN
5000.0000 [IU] | Freq: Three times a day (TID) | INTRAMUSCULAR | Status: DC
  Administered 2015-10-21 – 2015-10-22 (×4): 5000 [IU] via SUBCUTANEOUS
  Filled 2015-10-21 (×4): qty 1

## 2015-10-21 MED ORDER — OXYCODONE-ACETAMINOPHEN 5-325 MG PO TABS
1.0000 | ORAL_TABLET | ORAL | Status: DC | PRN
  Administered 2015-10-21: 2 via ORAL
  Administered 2015-10-21: 1 via ORAL
  Administered 2015-10-22 (×2): 2 via ORAL
  Filled 2015-10-21 (×3): qty 2
  Filled 2015-10-21: qty 1

## 2015-10-21 MED ORDER — MORPHINE SULFATE (PF) 4 MG/ML IV SOLN
4.0000 mg | INTRAVENOUS | Status: DC | PRN
  Filled 2015-10-21: qty 1

## 2015-10-21 MED ORDER — SODIUM CHLORIDE 0.9 % IV SOLN
INTRAVENOUS | Status: DC
  Administered 2015-10-21: 21:00:00 via INTRAVENOUS

## 2015-10-21 MED ORDER — PRAVASTATIN SODIUM 40 MG PO TABS
40.0000 mg | ORAL_TABLET | Freq: Every day | ORAL | Status: DC
  Administered 2015-10-21: 40 mg via ORAL
  Filled 2015-10-21: qty 1

## 2015-10-21 MED ORDER — ONDANSETRON HCL 4 MG PO TABS: 4.0000 mg | ORAL_TABLET | Freq: Four times a day (QID) | ORAL | Status: DC | PRN

## 2015-10-21 MED ORDER — LOSARTAN POTASSIUM-HCTZ 100-25 MG PO TABS: 1.0000 | ORAL_TABLET | Freq: Every day | ORAL | Status: DC

## 2015-10-21 MED ORDER — LOSARTAN POTASSIUM 50 MG PO TABS
100.0000 mg | ORAL_TABLET | Freq: Every day | ORAL | Status: DC
  Administered 2015-10-21 – 2015-10-22 (×2): 100 mg via ORAL
  Filled 2015-10-21 (×2): qty 2

## 2015-10-21 NOTE — ED Notes (Signed)
Pt. Put on 2 L O2.

## 2015-10-21 NOTE — Consult Note (Signed)
ORTHOPAEDIC CONSULTATION  REQUESTING PHYSICIAN: Norva Riffle. Marcille Blanco, MD No att. providers found   Chief Complaint: Back pain  HPI: Lindsey Shaw is a 74 y.o. female who complains of  Back pain after a ground level fall one day ago. She denies any LOC at the time of her fall. She denies any bowel/bladder dysfunction, saddle anesthesia, or numbness or tingling in her legs. Her pain has been improving overnight, abnd she states she walked to the bathroom on her own this morning.  Past Medical History  Diagnosis Date  . Mental disorder   . Depression   . Diabetes mellitus without complication ([place])   . Blood clotting tendency     2006   BLOOD CLOTT LEG TO GROIN  . GERD (gastroesophageal reflux disease)   . Cancer ([place])     MELANOMA   . Hypertension   . Myocardial infarction ([place])     10+ YRS (Lexiscan: no evid of ischemia/infarct, EF 72% 06/23/12 [facility])   Past Surgical History  Procedure Laterality Date  . Tubal ligation      73  . Toe surgery      BIL GREAT TOE JOINT REMOVED  . Cholecystectomy    . Back surgery      2007  . Lumbar laminectomy/decompression microdiscectomy Right 06/28/2013    Procedure: LUMBAR LAMINECTOMY/DECOMPRESSION MICRODISCECTOMY 1 LEVEL;  Surgeon: Elaina Hoops, MD;  Location: [facility];  Service: Neurosurgery;  Laterality: Right;  Lumbar Laminectomy Decompression Lumbar Four-Five Right   Social History   Social History  . Marital Status: Widowed    Spouse Name: N/A  . Number of Children: N/A  . Years of Education: N/A   Social History Main Topics  . Smoking status: Never Smoker   . Smokeless tobacco: None  . Alcohol Use: No  . Drug Use: No  . Sexual Activity: Not Asked   Other Topics Concern  . None   Social History Narrative   History reviewed. No pertinent family history. Allergies  Allergen Reactions  . Keflex [Cephalexin] Swelling   Prior to Admission medications   Medication Sig Start Date End Date Taking? Authorizing Provider   aspirin EC 81 MG tablet Take 81 mg by mouth daily.   Yes Historical Provider, MD  citalopram (CELEXA) 20 MG tablet Take 20 mg by mouth every evening.   Yes Historical Provider, MD  colchicine 0.6 MG tablet Take 0.6 mg by mouth daily as needed (gout).   Yes Historical Provider, MD  docusate sodium (COLACE) 100 MG capsule Take 100 mg by mouth daily as needed for mild constipation.   Yes Historical Provider, MD  losartan-hydrochlorothiazide (HYZAAR) 100-25 MG per tablet Take 1 tablet by mouth daily.   Yes Historical Provider, MD  lovastatin (MEVACOR) 40 MG tablet Take 40 mg by mouth at bedtime.   Yes Historical Provider, MD  metFORMIN (GLUCOPHAGE) 500 MG tablet Take 500 mg by mouth 2 (two) times daily with a meal.   Yes Historical Provider, MD  omeprazole (PRILOSEC) 20 MG capsule Take 20 mg by mouth every evening.   Yes Historical Provider, MD  cyclobenzaprine (FLEXERIL) 10 MG tablet Take 1 tablet (10 mg total) by mouth 3 (three) times daily as needed for muscle spasms. Patient not taking: Reported on 10/20/2015 06/29/13   Kary Kos, MD  docusate sodium (COLACE) 100 MG capsule Take 1 tablet once or twice daily as needed for constipation while taking narcotic pain medicine 10/20/15   Hinda Kehr, MD  oxyCODONE-acetaminophen (ROXICET) 5-325 MG  tablet Take 1-2 tablets by mouth every 4 (four) hours as needed for severe pain. 10/20/15   Hinda Kehr, MD   Dg Thoracic Spine 2 View  10/20/2015  CLINICAL DATA:  Mid back pain after slipping on a floor and falling backwards today. EXAM: THORACIC SPINE 2 VIEWS COMPARISON:  Chest radiographs dated 06/21/2013. Lumbar spine radiographs obtained today. FINDINGS: Minimal scoliosis. Multilevel degenerative changes. Interval approximately 20% T12 vertebral compression deformity with no visible bony retropulsion. IMPRESSION: 1. Interval 20% T12 vertebral compression deformity, possibly acute. 2. Mild thoracic spine degenerative changes. Electronically Signed   By: Claudie Revering M.D.   On: 10/20/2015 21:45   Dg Lumbar Spine 2-3 Views  10/20/2015  CLINICAL DATA:  Low right-sided back pain after slipping on floor and falling backwards today. EXAM: LUMBAR SPINE - 2-3 VIEW COMPARISON:  06/28/2013 FINDINGS: There is a mild compression fracture of T12, new from the prior exam, with a subtle fracture line along its anterior margin that appears acute. No other fractures. There is slight anterolisthesis of L4 on L5. There is mild loss of disc height L3-L4 with moderate loss of disc height at L4-L5 and L5-S1. IMPRESSION: Mild new compression fracture T12.  No other acute finding. Electronically Signed   By: Lajean Manes M.D.   On: 10/20/2015 21:43    Positive ROS: All other systems have been reviewed and were otherwise negative with the exception of those mentioned in the HPI and as above.  Physical Exam: General: Alert, no acute distress Cardiovascular: No pedal edema Respiratory: No cyanosis, no use of accessory musculature GI: No organomegaly, abdomen is soft and non-tender Skin: No lesions in the area of chief complaint Neurologic: Sensation intact distally Psychiatric: Patient is competent for consent with normal mood and affect Lymphatic: No axillary or cervical lymphadenopathy  MUSCULOSKELETAL: No palpable step-off or midline tenderness on direct palpation. Right-sided paraspinal tenderness is present in the thoracolumbar region. 5/5 strength bilaterally for HF,KF,KE, ankle DF/PF. Symmetric DTRs and intact sensation in all nerve distributions.  Assessment/Plan: T12 compression fracture of unknown acuity. Recommend progression of activity with PT and WBAT. If symptoms do not improve or she develops any neurologic changes, would evaluate with advanced imaging (CT or MRI) and consider TLSO brace.          10/21/2015 8:10 AM

## 2015-10-21 NOTE — Progress Notes (Signed)
Spoke to Dr. Bridgett Larsson regarding pt pain meds. He saw pt & she told him that pain not bad unless she moves, however, pt unable to work with PT d/t pain control. Dr. Bridgett Larsson advised he would take a look into it.

## 2015-10-21 NOTE — Progress Notes (Signed)
Patient request pain med alternative to morphine. Pt states that it does not work for her. Spoke to Dr. Bridgett Larsson & he told me that he would look at patient later in day.

## 2015-10-21 NOTE — H&P (Signed)
Lindsey Shaw is an 74 y.o. female.   Chief Complaint: Back pain HPI: The patient presents emergency department complaining of back pain following a mechanical fall. The patient states that she tripped over an object in the floor. X-rays revealed compression fracture of T12. The patient received multiple doses of analgesic in the emergency department but continued to have pain making ambulation difficult which prompted the emergency department staff to call for admission.  Past Medical History  Diagnosis Date  . Mental disorder   . Depression   . Diabetes mellitus without complication ([place])   . Blood clotting tendency     2006   BLOOD CLOTT LEG TO GROIN  . GERD (gastroesophageal reflux disease)   . Cancer ([place])     MELANOMA   . Hypertension   . Myocardial infarction ([place])     10+ YRS (Lexiscan: no evid of ischemia/infarct, EF 72% 06/23/12 [facility])    Past Surgical History  Procedure Laterality Date  . Tubal ligation      73  . Toe surgery      BIL GREAT TOE JOINT REMOVED  . Cholecystectomy    . Back surgery      2007  . Lumbar laminectomy/decompression microdiscectomy Right 06/28/2013    Procedure: LUMBAR LAMINECTOMY/DECOMPRESSION MICRODISCECTOMY 1 LEVEL;  Surgeon: Elaina Hoops, MD;  Location: [facility];  Service: Neurosurgery;  Laterality: Right;  Lumbar Laminectomy Decompression Lumbar Four-Five Right    History reviewed. No pertinent family history. Social History:  reports that she has never smoked. She does not have any smokeless tobacco history on file. She reports that she does not drink alcohol or use illicit drugs.  Allergies:  Allergies  Allergen Reactions  . Keflex [Cephalexin] Swelling    Medications Prior to Admission  Medication Sig Dispense Refill  . aspirin EC 81 MG tablet Take 81 mg by mouth daily.    . citalopram (CELEXA) 20 MG tablet Take 20 mg by mouth every evening.    . colchicine 0.6 MG tablet Take 0.6 mg by mouth daily as needed (gout).    Marland Kitchen  docusate sodium (COLACE) 100 MG capsule Take 100 mg by mouth daily as needed for mild constipation.    Marland Kitchen losartan-hydrochlorothiazide (HYZAAR) 100-25 MG per tablet Take 1 tablet by mouth daily.    Marland Kitchen lovastatin (MEVACOR) 40 MG tablet Take 40 mg by mouth at bedtime.    . metFORMIN (GLUCOPHAGE) 500 MG tablet Take 500 mg by mouth 2 (two) times daily with a meal.    . omeprazole (PRILOSEC) 20 MG capsule Take 20 mg by mouth every evening.    . cyclobenzaprine (FLEXERIL) 10 MG tablet Take 1 tablet (10 mg total) by mouth 3 (three) times daily as needed for muscle spasms. (Patient not taking: Reported on 10/20/2015) 40 tablet 1    Results for orders placed or performed during the hospital encounter of 10/20/15 (from the past 48 hour(s))  CBC with Differential/Platelet     Status: Abnormal   Collection Time: 10/20/15 11:18 PM  Result Value Ref Range   WBC 9.6 3.6 - 11.0 K/uL   RBC 3.35 (L) 3.80 - 5.20 MIL/uL   Hemoglobin 10.2 (L) 12.0 - 16.0 g/dL   HCT 30.7 (L) 35.0 - 47.0 %   MCV 91.6 80.0 - 100.0 fL   MCH 30.6 26.0 - 34.0 pg   MCHC 33.4 32.0 - 36.0 g/dL   RDW 12.7 11.5 - 14.5 %   Platelets 181 150 - 440 K/uL  Neutrophils Relative % 70 %   Neutro Abs 6.7 (H) 1.4 - 6.5 K/uL   Lymphocytes Relative 25 %   Lymphs Abs 2.4 1.0 - 3.6 K/uL   Monocytes Relative 4 %   Monocytes Absolute 0.4 0.2 - 0.9 K/uL   Eosinophils Relative 1 %   Eosinophils Absolute 0.1 0 - 0.7 K/uL   Basophils Relative 0 %   Basophils Absolute 0.0 0 - 0.1 K/uL  Basic metabolic panel     Status: Abnormal   Collection Time: 10/20/15 11:18 PM  Result Value Ref Range   Sodium 136 135 - 145 mmol/L   Potassium 3.3 (L) 3.5 - 5.1 mmol/L   Chloride 103 101 - 111 mmol/L   CO2 27 22 - 32 mmol/L   Glucose, Bld 160 (H) 65 - 99 mg/dL   BUN 29 (H) 6 - 20 mg/dL   Creatinine, Ser 1.48 (H) 0.44 - 1.00 mg/dL   Calcium 8.9 8.9 - 10.3 mg/dL   GFR calc non Af Amer 34 (L) >60 mL/min   GFR calc Af Amer 40 (L) >60 mL/min    Comment:  (NOTE) The eGFR has been calculated using the CKD EPI equation. This calculation has not been validated in all clinical situations. eGFR's persistently <60 mL/min signify possible Chronic Kidney Disease.    Anion gap 6 5 - 15  Urinalysis complete, with microscopic ([facility] only)     Status: Abnormal   Collection Time: 10/21/15  1:39 AM  Result Value Ref Range   Color, Urine YELLOW (A) YELLOW   APPearance CLEAR (A) CLEAR   Glucose, UA NEGATIVE NEGATIVE mg/dL   Bilirubin Urine NEGATIVE NEGATIVE   Ketones, ur NEGATIVE NEGATIVE mg/dL   Specific Gravity, Urine 1.014 1.005 - 1.030   Hgb urine dipstick NEGATIVE NEGATIVE   pH 5.0 5.0 - 8.0   Protein, ur NEGATIVE NEGATIVE mg/dL   Nitrite NEGATIVE NEGATIVE   Leukocytes, UA TRACE (A) NEGATIVE   RBC / HPF 0-5 0 - 5 RBC/hpf   WBC, UA 0-5 0 - 5 WBC/hpf   Bacteria, UA NONE SEEN NONE SEEN   Squamous Epithelial / LPF 0-5 (A) NONE SEEN   Mucous PRESENT   TSH     Status: None   Collection Time: 10/21/15  2:30 AM  Result Value Ref Range   TSH 1.931 0.350 - 4.500 uIU/mL   Dg Thoracic Spine 2 View  10/20/2015  CLINICAL DATA:  Mid back pain after slipping on a floor and falling backwards today. EXAM: THORACIC SPINE 2 VIEWS COMPARISON:  Chest radiographs dated 06/21/2013. Lumbar spine radiographs obtained today. FINDINGS: Minimal scoliosis. Multilevel degenerative changes. Interval approximately 20% T12 vertebral compression deformity with no visible bony retropulsion. IMPRESSION: 1. Interval 20% T12 vertebral compression deformity, possibly acute. 2. Mild thoracic spine degenerative changes. Electronically Signed   By: Claudie Revering M.D.   On: 10/20/2015 21:45   Dg Lumbar Spine 2-3 Views  10/20/2015  CLINICAL DATA:  Low right-sided back pain after slipping on floor and falling backwards today. EXAM: LUMBAR SPINE - 2-3 VIEW COMPARISON:  06/28/2013 FINDINGS: There is a mild compression fracture of T12, new from the prior exam, with a subtle fracture line along  its anterior margin that appears acute. No other fractures. There is slight anterolisthesis of L4 on L5. There is mild loss of disc height L3-L4 with moderate loss of disc height at L4-L5 and L5-S1. IMPRESSION: Mild new compression fracture T12.  No other acute finding. Electronically Signed   By: Shanon Brow  Ormond M.D.   On: 10/20/2015 21:43    Review of Systems  Constitutional: Negative for fever and chills.  HENT: Negative for sore throat and tinnitus.   Eyes: Negative for blurred vision and redness.  Respiratory: Negative for cough and shortness of breath.   Cardiovascular: Negative for chest pain, palpitations, orthopnea and PND.  Gastrointestinal: Negative for nausea, vomiting, abdominal pain and diarrhea.  Genitourinary: Negative for dysuria, urgency and frequency.  Musculoskeletal: Positive for back pain. Negative for myalgias and joint pain.  Skin: Negative for rash.       No lesions  Neurological: Negative for speech change, focal weakness and weakness.  Endo/Heme/Allergies: Does not bruise/bleed easily.       No temperature intolerance  Psychiatric/Behavioral: Negative for depression and suicidal ideas.    Blood pressure 112/54, pulse 58, temperature 97.5 F (36.4 C), temperature source Oral, resp. rate 13, height '5\' 5"'  (1.651 m), weight 67.994 kg (149 lb 14.4 oz), SpO2 100 %. Physical Exam  Vitals reviewed. Constitutional: She is oriented to person, place, and time. She appears well-developed and well-nourished. No distress.  HENT:  Head: Normocephalic and atraumatic.  Mouth/Throat: Oropharynx is clear and moist.  Eyes: Conjunctivae and EOM are normal. Pupils are equal, round, and reactive to light. No scleral icterus.  Neck: Normal range of motion. Neck supple. No JVD present. No tracheal deviation present. No thyromegaly present.  Cardiovascular: Normal rate, regular rhythm and normal heart sounds.  Exam reveals no gallop and no friction rub.   No murmur heard. Respiratory:  Effort normal and breath sounds normal.  GI: Soft. Bowel sounds are normal. She exhibits no distension. There is no tenderness.  Genitourinary:  Deferred  Musculoskeletal: Normal range of motion. She exhibits no edema.  Lymphadenopathy:    She has no cervical adenopathy.  Neurological: She is alert and oriented to person, place, and time. No cranial nerve deficit. She exhibits normal muscle tone.  Skin: Skin is warm and dry. No rash noted. There is erythema (left anterior lower extremity).  Psychiatric: She has a normal mood and affect. Her behavior is normal. Judgment and thought content normal.     Assessment/Plan This is a 74 year old Caucasian female admitted for intractable pain secondary to compression fracture of 12th thoracic vertebrae. 1. Intractable pain: Allow IV narcotic dosing until patient can be transitioned to oral analgesics for home. 2. Vertebral fracture: T12; no neurologic deficits in lower extremities. Walking difficult due to pain. Patient may need orthotic brace to limit lateral flexion. 3. Hypertension: Continue Hydrocort Dyazide and Cozaar 4. DVT prophylaxis: Heparin 5. GI prophylaxis: Pantoprazole per home regimen The patient is a full code. Time spent on admission orders and patient care approximately 35 minutes  Harrie Foreman 10/21/2015, 5:31 AM

## 2015-10-21 NOTE — Progress Notes (Signed)
[facility] [place] [facility] NAME: Lindsey Shaw    MR#:  [id number]  DATE OF BIRTH:  [birth year]  SUBJECTIVE:  CHIEF COMPLAINT:   Chief Complaint  Patient presents with  . Fall    Pt. here via EMS from home for fall.   back pain while moving.  REVIEW OF SYSTEMS:  CONSTITUTIONAL: No fever, fatigue or weakness.  EYES: No blurred or double vision.  EARS, NOSE, AND THROAT: No tinnitus or ear pain.  RESPIRATORY: No cough, shortness of breath, wheezing or hemoptysis.  CARDIOVASCULAR: No chest pain, orthopnea, edema.  GASTROINTESTINAL: No nausea, vomiting, diarrhea or abdominal pain.  GENITOURINARY: No dysuria, hematuria.  ENDOCRINE: No polyuria, nocturia,  HEMATOLOGY: No anemia, easy bruising or bleeding SKIN: No rash or lesion. MUSCULOSKELETAL: Back pain.  NEUROLOGIC: No tingling, numbness, weakness.  PSYCHIATRY: No anxiety or depression.   DRUG ALLERGIES:   Allergies  Allergen Reactions  . Keflex [Cephalexin] Swelling    VITALS:  Blood pressure 130/59, pulse 58, temperature 98.5 F (36.9 C), temperature source Oral, resp. rate 13, height 5\' 5"  (1.651 m), weight 67.994 kg (149 lb 14.4 oz), SpO2 92 %.  PHYSICAL EXAMINATION:  GENERAL:  74 y.o.-year-old patient lying in the bed with no acute distress.  EYES: Pupils equal, round, reactive to light and accommodation. No scleral icterus. Extraocular muscles intact.  HEENT: Head atraumatic, normocephalic. Oropharynx and nasopharynx clear. Moist oral mucosa. NECK:  Supple, no jugular venous distention. No thyroid enlargement, no tenderness.  LUNGS: Normal breath sounds bilaterally, no wheezing, rales,rhonchi or crepitation. No use of accessory muscles of respiration.  CARDIOVASCULAR: S1, S2 normal. No murmurs, rubs, or gallops.  ABDOMEN: Soft, nontender, mild distention. Bowel sounds present. No organomegaly or mass.  EXTREMITIES: No pedal edema, cyanosis, or clubbing.  NEUROLOGIC: Cranial  nerves II through XII are intact. Muscle strength 5/5 in all extremities. Sensation intact. Gait not checked.  PSYCHIATRIC: The patient is alert and oriented x 3.  SKIN: No obvious rash, lesion, or ulcer.    LABORATORY PANEL:   CBC  Recent Labs Lab 10/20/15 2318  WBC 9.6  HGB 10.2*  HCT 30.7*  PLT 181   ------------------------------------------------------------------------------------------------------------------  Chemistries   Recent Labs Lab 10/20/15 2318  NA 136  K 3.3*  CL 103  CO2 27  GLUCOSE 160*  BUN 29*  CREATININE 1.48*  CALCIUM 8.9   ------------------------------------------------------------------------------------------------------------------  Cardiac Enzymes No results for input(s): TROPONINI in the last 168 hours. ------------------------------------------------------------------------------------------------------------------  RADIOLOGY:  Dg Thoracic Spine 2 View  10/20/2015  CLINICAL DATA:  Mid back pain after slipping on a floor and falling backwards today. EXAM: THORACIC SPINE 2 VIEWS COMPARISON:  Chest radiographs dated 06/21/2013. Lumbar spine radiographs obtained today. FINDINGS: Minimal scoliosis. Multilevel degenerative changes. Interval approximately 20% T12 vertebral compression deformity with no visible bony retropulsion. IMPRESSION: 1. Interval 20% T12 vertebral compression deformity, possibly acute. 2. Mild thoracic spine degenerative changes. Electronically Signed   By: Claudie Revering M.D.   On: 10/20/2015 21:45   Dg Lumbar Spine 2-3 Views  10/20/2015  CLINICAL DATA:  Low right-sided back pain after slipping on floor and falling backwards today. EXAM: LUMBAR SPINE - 2-3 VIEW COMPARISON:  06/28/2013 FINDINGS: There is a mild compression fracture of T12, new from the prior exam, with a subtle fracture line along its anterior margin that appears acute. No other fractures. There is slight anterolisthesis of L4 on L5. There is mild loss of disc  height L3-L4 with moderate loss  of disc height at L4-L5 and L5-S1. IMPRESSION: Mild new compression fracture T12.  No other acute finding. Electronically Signed   By: Lajean Manes M.D.   On: 10/20/2015 21:43    EKG:   Orders placed or performed during the hospital encounter of 06/21/13  . EKG 12 lead  . EKG 12 lead    ASSESSMENT AND PLAN:   1. Intractable back pain:  Pain control when necessary.   2. Vertebral fracture: T12; Dr. Daine Gip, ortho, recommend progression of activity with PT and WBAT. If symptoms do not improve or she develops any neurologic changes, would evaluate with advanced imaging (CT or MRI) and consider TLSO brace.   3. Hypertension: Continue Hydrocort Dyazide and Cozaar  *Hypokalemia, potassium supplement and follow-up BMP and magnesium level. *Acute renal failure with dehydration, continue IV fluid support and follow-up BMP. *Diabetes, sliding scale.  All the records are reviewed and case discussed with Care Management/Social Workerr. Management plans discussed with the patient, her daughter and other family members and they are in agreement.  CODE STATUS: Full code  TOTAL TIME TAKING CARE OF THIS PATIENT: 37 minutes.   POSSIBLE D/C IN 2 DAYS, DEPENDING ON CLINICAL CONDITION.   Demetrios Loll M.D on 10/21/2015 at 5:41 PM  Between 7am to 6pm - Pager - [phone]  After 6pm go to [email or web address] - password [email or web address] [facility]  Office  [phone]  CC: Primary care physician; Kirk Ruths., MD

## 2015-10-21 NOTE — Progress Notes (Signed)
PT Cancellation Note  Patient Details Name: Lindsey Shaw MRN: [id number] DOB: 1941-03-23   Cancelled Treatment:    Reason Eval/Treat Not Completed: Pain limiting ability to participate Patient was received sitting up at the edge of the bed with nursing and was assisted back to bed with bed alarm on. RN notified and PT will be attempted tomorrow.    Bertram Denver, PT, DPT, CWCE 10/21/2015, 3:13 PM

## 2015-10-22 DIAGNOSIS — S22089A Unspecified fracture of T11-T12 vertebra, initial encounter for closed fracture: Secondary | ICD-10-CM | POA: Diagnosis not present

## 2015-10-22 LAB — BASIC METABOLIC PANEL
Anion gap: 7 (ref 5–15)
BUN: 27 mg/dL — ABNORMAL HIGH (ref 6–20)
CALCIUM: 9.4 mg/dL (ref 8.9–10.3)
CO2: 26 mmol/L (ref 22–32)
CREATININE: 1.44 mg/dL — AB (ref 0.44–1.00)
Chloride: 106 mmol/L (ref 101–111)
GFR calc non Af Amer: 35 mL/min — ABNORMAL LOW (ref >60)
GFR, EST AFRICAN AMERICAN: 41 mL/min — AB (ref >60)
GLUCOSE: 143 mg/dL — AB (ref 65–99)
Potassium: 4.5 mmol/L (ref 3.5–5.1)
Sodium: 139 mmol/L (ref 135–145)

## 2015-10-22 LAB — GLUCOSE, CAPILLARY: Glucose-Capillary: 158 mg/dL — ABNORMAL HIGH (ref 65–99)

## 2015-10-22 LAB — MAGNESIUM: Magnesium: 1.7 mg/dL (ref 1.7–2.4)

## 2015-10-22 MED ORDER — OXYCODONE-ACETAMINOPHEN 5-325 MG PO TABS: 1.0000 | ORAL_TABLET | Freq: Four times a day (QID) | ORAL | Status: DC | PRN

## 2015-10-22 MED ORDER — MAGNESIUM HYDROXIDE 400 MG/5ML PO SUSP: 30.0000 mL | Freq: Every day | ORAL | Status: DC | PRN

## 2015-10-22 NOTE — Discharge Instructions (Signed)
Heart healthy and ADA diet. Activity as tolerated.  As we discussed, you have what appears to be a new fracture of your T12 vertebra.  We spoke by phone with the orthopedic surgeon who does not feel that urgent or emergent intervention is necessary at this time based on the x-ray findings.  However, it is important you follow-up with your back surgeon at the next available opportunity.  If you develop any new or worsening symptoms that concern you, please return immediately to the emergency department.    Vertebral Fracture A vertebral fracture is a break in one of the bones that make up the spine (vertebrae). The vertebrae are stacked on top of each other to form the spinal column. They support the body and protect the spinal cord. The vertebral column has an upper part (cervical spine), a middle part (thoracic spine), and a lower part (lumbar spine). Most vertebral fractures occur in the thoracic spine or lumbar spine. There are three main types of vertebral fractures:  Flexion fracture. This happens when vertebrae collapse. Vertebrae can collapse:  In the front (compression fracture). This type of fracture is common in people who have a condition that causes their bones to be weak and brittle (osteoporosis). The fracture can make a person lose height.  In the front and back (axial burst fracture).  Extension fracture. This happens when an external force pulls apart the vertebrae.  Rotation fracture. This happens when the spine bends extremely in one direction. This type can cause a piece of a vertebra to break off (transverse process fracture) or move out of its normal position (fracture dislocation). This type of fracture has a high risk for spinal cord injury. Vertebral fractures can range from mild to very severe. The most severe types are those that cause the broken bones to move out of place (unstable) and those that injure or press on the spinal cord. CAUSES This condition is  usually caused by a forceful injury. This type of injury commonly results from:  Car accidents.  Falling or jumping from a great height.  Collisions in contact sports.  Violent acts, such as an assault or a gunshot wound. RISK FACTORS This injury is more likely to happen to people who:  Have osteoporosis.  Participate in contact sports.  Are in situations that could result in falls or other violent injuries. SYMPTOMS Symptoms of this injury depend on the location and the type of fracture. The most common symptom is back pain that gets worse with movement. You may also have trouble standing or walking. If a fracture has damaged your spinal cord or is pressing on it, you may also have:  Numbness.  Tingling.  Weakness.  Loss of movement.  Loss of bowel or bladder control. DIAGNOSIS This injury may be diagnosed based on symptoms, medical history, and a physical exam. You may also have imaging tests to confirm the diagnosis. These may include:  Spine X-ray.  CT scan.  MRI. TREATMENT Treatment for this injury depends on the type of fracture. If your fracture is stable and does not affect your spinal cord, it may heal with nonsurgical treatment, such as:  Taking pain medicine.  Wearing a cast or a brace.  Doing physical therapy exercises. If your vertebral fracture is unstable or it affects your spinal cord, you may need surgical treatment, such as:  Laminectomy. This procedure involves removing the part of a vertebra that is pushing on the spinal cord (spinal decompression surgery). Bone fragments may also  be removed.  Spinal fusion. This procedure is used to stabilize an unstable fracture. Vertebrae may be joined together with a piece of bone from another part of your body (graft) and held in place with rods, plates, or screws.  Vertebroplasty. In this procedure, bone cement is used to rebuild collapsed vertebrae. HOME CARE INSTRUCTIONS General Instructions  Take  medicines only as directed by your health care provider.  Do not drive or operate heavy machinery while taking pain medicine.  If directed, apply ice to the injured area:  Put ice in a plastic bag.  Place a towel between your skin and the bag.  Leave the ice on for 30 minutes every two hours at first. Then apply the ice as needed.  Wear your neck brace or back brace as directed by your health care provider.  Do not drink alcohol. Alcohol can interfere with your treatment.  Keep all follow-up visits as directed by your health care provider. This is important. It can help to prevent permanent injury, disability, and long-lasting (chronic) pain. Activity  Stay in bed (on bed rest) only as directed by your health care provider. Being on bed rest for too long can make your condition worse.  Return to your normal activities as directed by your health care provider. Ask what activities are safe for you.  Do exercises to improve motion and strength in your back (physical therapy), as recommended by your health care provider.   Exercise regularly as directed by your health care provider. SEEK MEDICAL CARE IF:  You have a fever.  You develop a cough that makes your pain worse.  Your pain medicine is not helping.  Your pain does not get better over time.  You cannot return to your normal activities as planned or expected. SEEK IMMEDIATE MEDICAL CARE IF:  Your pain is very bad and it suddenly gets worse.  You are unable to move any body part (paralysis) that is below the level of your injury.  You have numbness, tingling, or weakness in any body part that is below the level of your injury.  You cannot control your bladder or bowels.   This information is not intended to replace advice given to you by your health care provider. Make sure you discuss any questions you have with your health care provider.   Document Released: 01/09/2005 Document Revised: 04/18/2015 Document  Reviewed: 12/07/2014 Elsevier Interactive Patient Education [employer].

## 2015-10-22 NOTE — Progress Notes (Signed)
Patient discharged & daughter bedside to take her home. IV removed. D/C instructions given along with Rx for pain meds.

## 2015-10-22 NOTE — Care Management Note (Signed)
Case Management Note  Patient Details  Name: Waverley Troutwine MRN: [id number] Date of Birth: March 20, 1941  Subjective/Objective:    Ms Mihalovich did not want to wait for delivery of a rolling walker. A request for home health PT was called and faxed to [facility] along with a request for delivery of a front wheel rolling walker.                 Action/Plan:   Expected Discharge Date:  10/23/15               Expected Discharge Plan:     In-House Referral:     Discharge planning Services     Post Acute Care Choice:    Choice offered to:     DME Arranged:    DME Agency:     HH Arranged:    HH Agency:     Status of Service:     Medicare Important Message Given:    Date Medicare IM Given:    Medicare IM give by:    Date Additional Medicare IM Given:    Additional Medicare Important Message give by:     If discussed at [place] of Stay Meetings, dates discussed:    Additional Comments:  Tallon Gertz A, RN 10/22/2015, 12:39 PM

## 2015-10-22 NOTE — Discharge Summary (Signed)
[facility] [place] NAME: Lindsey Shaw    MR#:  [id number]  DATE OF BIRTH:  [birth year]  DATE OF ADMISSION:  10/20/2015 ADMITTING PHYSICIAN: Harrie Foreman, MD  DATE OF DISCHARGE: 10/22/2015  1:08 PM  PRIMARY CARE PHYSICIAN: Kirk Ruths., MD    ADMISSION DIAGNOSIS:  T12 compression fracture, initial encounter ([place]) [id number]   DISCHARGE DIAGNOSIS:  T12 compression fracture  SECONDARY DIAGNOSIS:   Past Medical History  Diagnosis Date  . Mental disorder   . Depression   . Diabetes mellitus without complication ([place])   . Blood clotting tendency     2006   BLOOD CLOTT LEG TO GROIN  . GERD (gastroesophageal reflux disease)   . Cancer ([place])     MELANOMA   . Hypertension   . Myocardial infarction ([place])     10+ YRS (Lexiscan: no evid of ischemia/infarct, EF 72% 06/23/12 [facility])    HOSPITAL COURSE:   1. Intractable back pain:  Pain control when necessary.   2. Vertebral fracture: T12; Dr. Daine Gip, ortho, recommended progression of activity with PT and WBAT. If symptoms do not improve or she develops any neurologic changes, would evaluate with advanced imaging (CT or MRI) and consider TLSO brace. The patient ended up home PT and home health for PT evaluation.  3. Hypertension: Continue Hydrocort Dyazide and Cozaar  *Hypokalemia, improved after treatment of potassium *Acute renal failure with dehydration, improved with IV fluid support *Diabetes, controlled with sliding scale.  DISCHARGE CONDITIONS:   Stable, discharged to home with home PT and home health today.  CONSULTS OBTAINED:     DRUG ALLERGIES:   Allergies  Allergen Reactions  . Keflex [Cephalexin] Swelling    DISCHARGE MEDICATIONS:   Discharge Medication List as of 10/22/2015 11:33 AM    START taking these medications   Details  !! docusate sodium (COLACE) 100 MG capsule Take 1 tablet once or twice daily as needed for constipation while taking  narcotic pain medicine, Print     !! - Potential duplicate medications found. Please discuss with provider.    CONTINUE these medications which have CHANGED   Details  oxyCODONE-acetaminophen (ROXICET) 5-325 MG tablet Take 1 tablet by mouth every 6 (six) hours as needed for severe pain., Starting 10/22/2015, Until Discontinued, Print      CONTINUE these medications which have NOT CHANGED   Details  aspirin EC 81 MG tablet Take 81 mg by mouth daily., Until Discontinued, Historical Med    citalopram (CELEXA) 20 MG tablet Take 20 mg by mouth every evening., Until Discontinued, Historical Med    colchicine 0.6 MG tablet Take 0.6 mg by mouth daily as needed (gout)., Until Discontinued, Historical Med    !! docusate sodium (COLACE) 100 MG capsule Take 100 mg by mouth daily as needed for mild constipation., Until Discontinued, Historical Med    losartan-hydrochlorothiazide (HYZAAR) 100-25 MG per tablet Take 1 tablet by mouth daily., Until Discontinued, Historical Med    lovastatin (MEVACOR) 40 MG tablet Take 40 mg by mouth at bedtime., Until Discontinued, Historical Med    metFORMIN (GLUCOPHAGE) 500 MG tablet Take 500 mg by mouth 2 (two) times daily with a meal., Until Discontinued, Historical Med    omeprazole (PRILOSEC) 20 MG capsule Take 20 mg by mouth every evening., Until Discontinued, Historical Med    cyclobenzaprine (FLEXERIL) 10 MG tablet Take 1 tablet (10 mg total) by mouth 3 (three) times daily as needed for muscle spasms., Starting 06/29/2013,  Until Discontinued, Normal     !! - Potential duplicate medications found. Please discuss with provider.       DISCHARGE INSTRUCTIONS:    If you experience worsening of your admission symptoms, develop shortness of breath, life threatening emergency, suicidal or homicidal thoughts you must seek medical attention immediately by calling 911 or calling your MD immediately  if symptoms less severe.  You Must read complete  instructions/literature along with all the possible adverse reactions/side effects for all the Medicines you take and that have been prescribed to you. Take any new Medicines after you have completely understood and accept all the possible adverse reactions/side effects.   Please note  You were cared for by a hospitalist during your hospital stay. If you have any questions about your discharge medications or the care you received while you were in the hospital after you are discharged, you can call the unit and asked to speak with the hospitalist on call if the hospitalist that took care of you is not available. Once you are discharged, your primary care physician will handle any further medical issues. Please note that NO REFILLS for any discharge medications will be authorized once you are discharged, as it is imperative that you return to your primary care physician (or establish a relationship with a primary care physician if you do not have one) for your aftercare needs so that they can reassess your need for medications and monitor your lab values.    Today   SUBJECTIVE      VITAL SIGNS:  Blood pressure 134/62, pulse 62, temperature 98.1 F (36.7 C), temperature source Oral, resp. rate 16, height 5\' 5"  (1.651 m), weight 67.994 kg (149 lb 14.4 oz), SpO2 94 %.  I/O:   Intake/Output Summary (Last 24 hours) at 10/22/15 1435 Last data filed at 10/22/15 0900  Gross per 24 hour  Intake 2185.83 ml  Output      0 ml  Net 2185.83 ml    PHYSICAL EXAMINATION:  GENERAL:  74 y.o.-year-old patient lying in the bed with no acute distress.  EYES: Pupils equal, round, reactive to light and accommodation. No scleral icterus. Extraocular muscles intact.  HEENT: Head atraumatic, normocephalic. Oropharynx and nasopharynx clear.  NECK:  Supple, no jugular venous distention. No thyroid enlargement, no tenderness.  LUNGS: Normal breath sounds bilaterally, no wheezing, rales,rhonchi or crepitation. No  use of accessory muscles of respiration.  CARDIOVASCULAR: S1, S2 normal. No murmurs, rubs, or gallops.  ABDOMEN: Soft, non-tender, non-distended. Bowel sounds present. No organomegaly or mass.  EXTREMITIES: No pedal edema, cyanosis, or clubbing.  NEUROLOGIC: Cranial nerves II through XII are intact. Muscle strength 5/5 in all extremities. Sensation intact. Gait not checked.  PSYCHIATRIC: The patient is alert and oriented x 3.  SKIN: No obvious rash, lesion, or ulcer.   DATA REVIEW:   CBC  Recent Labs Lab 10/20/15 2318  WBC 9.6  HGB 10.2*  HCT 30.7*  PLT 181    Chemistries   Recent Labs Lab 10/22/15 0226  NA 139  K 4.5  CL 106  CO2 26  GLUCOSE 143*  BUN 27*  CREATININE 1.44*  CALCIUM 9.4  MG 1.7    Cardiac Enzymes No results for input(s): TROPONINI in the last 168 hours.  Microbiology Results  Results for orders placed or performed during the hospital encounter of 06/21/13  Surgical pcr screen     Status: None   Collection Time: 06/21/13 10:12 AM  Result Value Ref Range Status  MRSA, PCR NEGATIVE NEGATIVE Final   Staphylococcus aureus NEGATIVE NEGATIVE Final    Comment:        The Xpert SA Assay (FDA approved for NASAL specimens in patients over 17 years of age), is one component of a comprehensive surveillance program.  Test performance has been validated by [employer] for patients greater than or equal to 28 year old. It is not intended to diagnose infection nor to guide or monitor treatment.    RADIOLOGY:  Dg Thoracic Spine 2 View  10/20/2015  CLINICAL DATA:  Mid back pain after slipping on a floor and falling backwards today. EXAM: THORACIC SPINE 2 VIEWS COMPARISON:  Chest radiographs dated 06/21/2013. Lumbar spine radiographs obtained today. FINDINGS: Minimal scoliosis. Multilevel degenerative changes. Interval approximately 20% T12 vertebral compression deformity with no visible bony retropulsion. IMPRESSION: 1. Interval 20% T12 vertebral  compression deformity, possibly acute. 2. Mild thoracic spine degenerative changes. Electronically Signed   By: Claudie Revering M.D.   On: 10/20/2015 21:45   Dg Lumbar Spine 2-3 Views  10/20/2015  CLINICAL DATA:  Low right-sided back pain after slipping on floor and falling backwards today. EXAM: LUMBAR SPINE - 2-3 VIEW COMPARISON:  06/28/2013 FINDINGS: There is a mild compression fracture of T12, new from the prior exam, with a subtle fracture line along its anterior margin that appears acute. No other fractures. There is slight anterolisthesis of L4 on L5. There is mild loss of disc height L3-L4 with moderate loss of disc height at L4-L5 and L5-S1. IMPRESSION: Mild new compression fracture T12.  No other acute finding. Electronically Signed   By: Lajean Manes M.D.   On: 10/20/2015 21:43        Management plans discussed with the patient, family and they are in agreement.  CODE STATUS:     Code Status Orders        Start     Ordered   10/21/15 0209  Full code   Continuous     10/21/15 0208      TOTAL TIME TAKING CARE OF THIS PATIENT: 33 minutes.    Demetrios Loll M.D on 10/22/2015 at 2:35 PM  Between 7am to 6pm - Pager - [phone]  After 6pm go to [email or web address] - password [email or web address] [facility]  Office  [phone]  CC: Primary care physician; Kirk Ruths., MD

## 2015-10-22 NOTE — Evaluation (Signed)
Physical Therapy Evaluation Patient Details Name: Lindsey Shaw MRN: [id number] DOB: [birth year] Today's Date: 10/22/2015   History of Present Illness  The patient presents emergency department complaining of back pain following a mechanical fall. The patient states that she tripped over an object in the floor. X-rays revealed compression fracture of T12.   Clinical Impression  74 yo Female fell at home and sustained a T12 compression fracture. She reports being independent in all ADLs prior to admittance. She reports being able to walk without assistive device and denies any falls other than this most recent one. Patient has no pain at rest but increased back pain with movement. PT educated patient on log roll technique for bed mobility and other correct body mechanics with mobility for less back discomfort. She is supervision bed mobility, modified independent with sit<>Stand transfers and CGA to close supervision with gait tasks. Patient was able to ambulate >150 feet with RW, requiring min VCs for correct gait technique. She was able to demonstrate improved gait speed and reports less back pain with prolonged walking. Patient would benefit from additional skilled PT intervention to improve LE strength, balance and gait safety and reduce fall risk.     Follow Up Recommendations Home health PT    Equipment Recommendations  Rolling walker with 5" wheels    Recommendations for Other Services       Precautions / Restrictions Precautions Precautions: Fall Restrictions Weight Bearing Restrictions: No      Mobility  Bed Mobility Overal bed mobility: Needs Assistance Bed Mobility: Supine to Sit;Sit to Supine     Supine to sit: Supervision Sit to supine: Supervision   General bed mobility comments: patient required mod VCs for log roll technique and hand placement during bed mobility; able to perform without bed rail, supervision; Educated patient in log roll for both supine to sit  and sit to supine; patient able to demonstrate independently. (4 min treatment)  Transfers Overall transfer level: Modified independent Equipment used: Rolling walker (2 wheeled)             General transfer comment: Patient transfers sit<>Stand with RW, modified independent with good hand placement and posture.  Ambulation/Gait Ambulation/Gait assistance: Min guard Ambulation Distance (Feet): 175 Feet Assistive device: Rolling walker (2 wheeled) Gait Pattern/deviations: Step-through pattern;Decreased dorsiflexion - right;Decreased dorsiflexion - left;Narrow base of support Gait velocity: decreased   General Gait Details: Patient ambulated with step through pattern, decreased gait speed but good posture and good safety with RW, CGA needed during turns initially but patient progressed to supervision during additional gait tasks.PT instructed patient in gait training with cues for increased step length, reduce pushing on RW for less back discomfort. Also instructed patient to maintain good posture especially during turns for less back discomfort (4 min treatment)  Stairs            Wheelchair Mobility    Modified Rankin (Stroke Patients Only)       Balance Overall balance assessment: Needs assistance Sitting-balance support: No upper extremity supported;Feet supported Sitting balance-Leahy Scale: Good     Standing balance support: No upper extremity supported Standing balance-Leahy Scale: Fair Standing balance comment: Patient able to stand without assistive device, static standing balance is fair, dynamic standing balance is poor as patient required min A for gait without AD                             Pertinent Vitals/Pain Pain Assessment: 0-10  Pain Score: 5  Pain Location: back; patient has no pain at rest but increased pain in back upon sitting/standing; pain decreased upon walking Pain Descriptors / Indicators: Aching;Sore Pain Intervention(s):  Limited activity within patient's tolerance;Monitored during session    [place] expects to be discharged to:: Private residence Living Arrangements: Children Available Help at Discharge: Family;Available 24 hours/day Type of Home: House Home Access: Stairs to enter Entrance Stairs-Rails: None Entrance Stairs-Number of Steps: 1 Home Layout: Able to live on main level with bedroom/bathroom;Multi-level   Additional Comments: has walker, bedside commode and cane from her husband that is >87 years old. She has no equipment for herself.    Prior Function Level of Independence: Independent         Comments: lived alone and was able to care for self independently without assistive device     Hand Dominance   Dominant Hand: Right    Extremity/Trunk Assessment   Upper Extremity Assessment: Overall WFL for tasks assessed           Lower Extremity Assessment: Generalized weakness         Communication   Communication: No difficulties  Cognition Arousal/Alertness: Awake/alert Behavior During Therapy: WFL for tasks assessed/performed Overall Cognitive Status: Within Functional Limits for tasks assessed                      General Comments General comments (skin integrity, edema, etc.): intact by gross assessment    Exercises        Assessment/Plan    PT Assessment Patient needs continued PT services  PT Diagnosis Difficulty walking;Generalized weakness   PT Problem List Decreased strength;Decreased safety awareness;Pain;Decreased activity tolerance;Decreased balance;Decreased mobility  PT Treatment Interventions DME instruction;Gait training;Stair training;Functional mobility training;Therapeutic activities;Therapeutic exercise;Balance training;Patient/family education   PT Goals (Current goals can be found in the Care Plan section) Acute Rehab PT Goals Patient Stated Goal: to go home and get better PT Goal Formulation: With patient Time  For Goal Achievement: 11/05/15 Potential to Achieve Goals: Good    Frequency Min 2X/week   Barriers to discharge Inaccessible home environment patient has 1 step to enter daughters house; is going home with daughter who will have someone available 24/7 for assistance;    Co-evaluation               End of Session Equipment Utilized During Treatment: Gait belt Activity Tolerance: Patient tolerated treatment well;Patient limited by fatigue Patient left: in bed;with call bell/phone within reach;with bed alarm set Nurse Communication: Mobility status    Functional Limitation: Mobility: Walking and moving around Mobility: Walking and Moving Around Current Status [id number]): At least 40 percent but less than 60 percent impaired, limited or restricted Mobility: Walking and Moving Around Goal Status [phone]): At least 20 percent but less than 40 percent impaired, limited or restricted    Time: [id number] PT Time Calculation (min) (ACUTE ONLY): 23 min   Charges:   PT Evaluation $Initial PT Evaluation Tier I: 1 Procedure PT Treatments $Therapeutic Activity: 8-22 mins   PT G Codes:   PT G-Codes **NOT FOR INPATIENT CLASS** Functional Limitation: Mobility: Walking and moving around Mobility: Walking and Moving Around Current Status [id number]): At least 40 percent but less than 60 percent impaired, limited or restricted Mobility: Walking and Moving Around Goal Status [phone]): At least 20 percent but less than 40 percent impaired, limited or restricted    Hopkins,Arden Axon PT, DPT 10/22/2015, 11:27 AM

## 2015-10-27 LAB — GLUCOSE, CAPILLARY: Glucose-Capillary: 118 mg/dL — ABNORMAL HIGH (ref 65–99)

## 2015-11-02 ENCOUNTER — Other Ambulatory Visit: Payer: Self-pay | Admitting: Internal Medicine

## 2015-11-02 DIAGNOSIS — M5440 Lumbago with sciatica, unspecified side: Secondary | ICD-10-CM

## 2015-11-15 ENCOUNTER — Ambulatory Visit
Admission: RE | Admit: 2015-11-15 | Discharge: 2015-11-15 | Disposition: A | Discharge status: Home / Self Care | Payer: Medicare Other | Source: Ambulatory Visit | Attending: Internal Medicine | Admitting: Internal Medicine

## 2015-11-15 DIAGNOSIS — S2239XA Fracture of one rib, unspecified side, initial encounter for closed fracture: Secondary | ICD-10-CM | POA: Insufficient documentation

## 2015-11-15 DIAGNOSIS — M5440 Lumbago with sciatica, unspecified side: Secondary | ICD-10-CM

## 2015-11-15 DIAGNOSIS — M47814 Spondylosis without myelopathy or radiculopathy, thoracic region: Secondary | ICD-10-CM | POA: Insufficient documentation

## 2015-11-17 ENCOUNTER — Encounter
Admission: RE | Admit: 2015-11-17 | Discharge: 2015-11-17 | Disposition: A | Discharge status: Home / Self Care | Payer: Medicare Other | Source: Ambulatory Visit | Attending: Orthopedic Surgery | Admitting: Orthopedic Surgery

## 2015-11-17 DIAGNOSIS — Z01810 Encounter for preprocedural cardiovascular examination: Secondary | ICD-10-CM | POA: Insufficient documentation

## 2015-11-17 DIAGNOSIS — Z01812 Encounter for preprocedural laboratory examination: Secondary | ICD-10-CM | POA: Diagnosis present

## 2015-11-17 HISTORY — DX: Anxiety disorder, unspecified: F41.9

## 2015-11-17 HISTORY — DX: Embolism and thrombosis of arteries of the lower extremities: I74.3

## 2015-11-17 HISTORY — DX: Chronic kidney disease, unspecified: N18.9

## 2015-11-17 HISTORY — DX: Unspecified osteoarthritis, unspecified site: M19.90

## 2015-11-17 LAB — BASIC METABOLIC PANEL
Anion gap: 9 (ref 5–15)
BUN: 26 mg/dL — AB (ref 6–20)
CO2: 25 mmol/L (ref 22–32)
CREATININE: 1.33 mg/dL — AB (ref 0.44–1.00)
Calcium: 9.6 mg/dL (ref 8.9–10.3)
Chloride: 100 mmol/L — ABNORMAL LOW (ref 101–111)
GFR, EST AFRICAN AMERICAN: 45 mL/min — AB (ref >60)
GFR, EST NON AFRICAN AMERICAN: 39 mL/min — AB (ref >60)
Glucose, Bld: 119 mg/dL — ABNORMAL HIGH (ref 65–99)
Potassium: 3.6 mmol/L (ref 3.5–5.1)
SODIUM: 134 mmol/L — AB (ref 135–145)

## 2015-11-17 LAB — SURGICAL PCR SCREEN
MRSA, PCR: NEGATIVE
STAPHYLOCOCCUS AUREUS: NEGATIVE

## 2015-11-17 NOTE — Patient Instructions (Signed)
  Your procedure is scheduled [id number] 6, 2016 ([place]) Report to Day Surgery. [facility]) To find out your arrival time please call [phone] between 1PM - 3PM on November 20, 2015 (Monday).  Remember: Instructions that are not followed completely may result in serious medical risk, up to and including death, or upon the discretion of your surgeon and anesthesiologist your surgery may need to be rescheduled.    __x__ 1. Do not eat food or drink liquids after midnight. No gum chewing or hard candies.     ____ 2. No Alcohol for 24 hours before or after surgery.   ____ 3. Bring all medications with you on the day of surgery if instructed.    __x__ 4. Notify your doctor if there is any change in your medical condition     (cold, fever, infections).     Do not wear jewelry, make-up, hairpins, clips or nail polish.  Do not wear lotions, powders, or perfumes. You may wear deodorant.  Do not shave 48 hours prior to surgery. Men may shave face and neck.  Do not bring valuables to the hospital.    [facility] is not responsible for any belongings or valuables.               Contacts, dentures or bridgework may not be worn into surgery.  Leave your suitcase in the car. After surgery it may be brought to your room.  For patients admitted to the hospital, discharge time is determined by your                treatment team.   Patients discharged the day of surgery will not be allowed to drive home.   Please read over the following fact sheets that you were given:   MRSA Information and Surgical Site Infection Prevention   ____ Take these medicines the morning of surgery with A SIP OF WATER:    1. Prilosec  2.   3.   4.  5.  6.  ____ Fleet Enema (as directed)   _x___ Use CHG Soap as directed  ____ Use inhalers on the day of surgery  _x___ Stop metformin 2 days prior to surgery (STOP METFORMIN ON  December 4)    ____ Take 1/2 of usual insulin dose the night before surgery and  none on the morning of surgery.   __x__ Stop Coumadin/Plavix/aspirin on (CHECK WITH DR. MENZ OFFICE ABOUT STOPPING ASPIRIN)  __x__ Stop Anti-inflammatories on (STOP IBUPROFEN NOW)   ____ Stop supplements until after surgery.    ____ Bring C-Pap to the hospital.

## 2015-11-17 NOTE — Pre-Procedure Instructions (Signed)
Called anesthesia, Dr Amie Critchley, regarding abnormal EKG. Clearance requested.  Dr Theodore Demark nurse notified by phone and the EKG and clearance request form faxed to [facility] ortho.

## 2015-11-20 NOTE — Pre-Procedure Instructions (Signed)
Cardiac clearance received by Dr. Clayborn Bigness

## 2015-11-21 ENCOUNTER — Ambulatory Visit: Payer: Medicare Other

## 2015-11-21 ENCOUNTER — Encounter: Payer: Self-pay | Admitting: Anesthesiology

## 2015-11-21 ENCOUNTER — Ambulatory Visit: Payer: Medicare Other | Admitting: Certified Registered"

## 2015-11-21 ENCOUNTER — Ambulatory Visit
Admission: RE | Admit: 2015-11-21 | Discharge: 2015-11-21 | Disposition: A | Discharge status: Home / Self Care | Payer: Medicare Other | Source: Ambulatory Visit | Attending: Orthopedic Surgery | Admitting: Orthopedic Surgery

## 2015-11-21 ENCOUNTER — Encounter
Admission: RE | Disposition: A | Discharge status: Home / Self Care | Payer: Self-pay | Source: Ambulatory Visit | Attending: Orthopedic Surgery

## 2015-11-21 DIAGNOSIS — Z8041 Family history of malignant neoplasm of ovary: Secondary | ICD-10-CM | POA: Diagnosis not present

## 2015-11-21 DIAGNOSIS — F329 Major depressive disorder, single episode, unspecified: Secondary | ICD-10-CM | POA: Insufficient documentation

## 2015-11-21 DIAGNOSIS — Z833 Family history of diabetes mellitus: Secondary | ICD-10-CM | POA: Insufficient documentation

## 2015-11-21 DIAGNOSIS — Z79899 Other long term (current) drug therapy: Secondary | ICD-10-CM | POA: Diagnosis not present

## 2015-11-21 DIAGNOSIS — Z801 Family history of malignant neoplasm of trachea, bronchus and lung: Secondary | ICD-10-CM | POA: Insufficient documentation

## 2015-11-21 DIAGNOSIS — Z86718 Personal history of other venous thrombosis and embolism: Secondary | ICD-10-CM | POA: Diagnosis not present

## 2015-11-21 DIAGNOSIS — I10 Essential (primary) hypertension: Secondary | ICD-10-CM | POA: Diagnosis not present

## 2015-11-21 DIAGNOSIS — D649 Anemia, unspecified: Secondary | ICD-10-CM | POA: Insufficient documentation

## 2015-11-21 DIAGNOSIS — S22089A Unspecified fracture of T11-T12 vertebra, initial encounter for closed fracture: Secondary | ICD-10-CM | POA: Diagnosis not present

## 2015-11-21 DIAGNOSIS — Z803 Family history of malignant neoplasm of breast: Secondary | ICD-10-CM | POA: Insufficient documentation

## 2015-11-21 DIAGNOSIS — Z9049 Acquired absence of other specified parts of digestive tract: Secondary | ICD-10-CM | POA: Insufficient documentation

## 2015-11-21 DIAGNOSIS — K219 Gastro-esophageal reflux disease without esophagitis: Secondary | ICD-10-CM | POA: Insufficient documentation

## 2015-11-21 DIAGNOSIS — R809 Proteinuria, unspecified: Secondary | ICD-10-CM | POA: Diagnosis not present

## 2015-11-21 DIAGNOSIS — J309 Allergic rhinitis, unspecified: Secondary | ICD-10-CM | POA: Insufficient documentation

## 2015-11-21 DIAGNOSIS — Z7984 Long term (current) use of oral hypoglycemic drugs: Secondary | ICD-10-CM | POA: Diagnosis not present

## 2015-11-21 DIAGNOSIS — W1830XA Fall on same level, unspecified, initial encounter: Secondary | ICD-10-CM | POA: Diagnosis not present

## 2015-11-21 DIAGNOSIS — M1990 Unspecified osteoarthritis, unspecified site: Secondary | ICD-10-CM | POA: Insufficient documentation

## 2015-11-21 DIAGNOSIS — E785 Hyperlipidemia, unspecified: Secondary | ICD-10-CM | POA: Diagnosis not present

## 2015-11-21 DIAGNOSIS — E1169 Type 2 diabetes mellitus with other specified complication: Secondary | ICD-10-CM | POA: Diagnosis not present

## 2015-11-21 DIAGNOSIS — Z85820 Personal history of malignant melanoma of skin: Secondary | ICD-10-CM | POA: Insufficient documentation

## 2015-11-21 DIAGNOSIS — Z7982 Long term (current) use of aspirin: Secondary | ICD-10-CM | POA: Diagnosis not present

## 2015-11-21 DIAGNOSIS — Z881 Allergy status to other antibiotic agents status: Secondary | ICD-10-CM | POA: Insufficient documentation

## 2015-11-21 DIAGNOSIS — T148XXA Other injury of unspecified body region, initial encounter: Secondary | ICD-10-CM

## 2015-11-21 HISTORY — PX: KYPHOPLASTY: SHX5884

## 2015-11-21 LAB — GLUCOSE, CAPILLARY
Glucose-Capillary: 146 mg/dL — ABNORMAL HIGH (ref 65–99)
Glucose-Capillary: 121 mg/dL — ABNORMAL HIGH (ref 65–99)

## 2015-11-21 SURGERY — KYPHOPLASTY
Anesthesia: General

## 2015-11-21 MED ORDER — PROPOFOL 10 MG/ML IV BOLUS
INTRAVENOUS | Status: DC | PRN
  Administered 2015-11-21: 30 mg via INTRAVENOUS
  Administered 2015-11-21: 20 mg via INTRAVENOUS

## 2015-11-21 MED ORDER — IOHEXOL 240 MG/ML SOLN
INTRAMUSCULAR | Status: AC
  Filled 2015-11-21: qty 200

## 2015-11-21 MED ORDER — CLINDAMYCIN PHOSPHATE 600 MG/50ML IV SOLN
INTRAVENOUS | Status: AC
  Filled 2015-11-21: qty 50

## 2015-11-21 MED ORDER — HYDROCODONE-ACETAMINOPHEN 5-325 MG PO TABS: 1.0000 | ORAL_TABLET | Freq: Four times a day (QID) | ORAL | Status: DC | PRN

## 2015-11-21 MED ORDER — PROPOFOL 500 MG/50ML IV EMUL
INTRAVENOUS | Status: DC | PRN
  Administered 2015-11-21: 50 ug/kg/min via INTRAVENOUS

## 2015-11-21 MED ORDER — FENTANYL CITRATE (PF) 100 MCG/2ML IJ SOLN: 25.0000 ug | INTRAMUSCULAR | Status: DC | PRN

## 2015-11-21 MED ORDER — KETAMINE HCL 10 MG/ML IJ SOLN
INTRAMUSCULAR | Status: DC | PRN
  Administered 2015-11-21: 20 mg via INTRAVENOUS

## 2015-11-21 MED ORDER — MIDAZOLAM HCL 5 MG/5ML IJ SOLN
INTRAMUSCULAR | Status: DC | PRN
  Administered 2015-11-21 (×2): 1 mg via INTRAVENOUS

## 2015-11-21 MED ORDER — BUPIVACAINE-EPINEPHRINE (PF) 0.5% -1:200000 IJ SOLN
INTRAMUSCULAR | Status: AC
  Filled 2015-11-21: qty 30

## 2015-11-21 MED ORDER — FENTANYL CITRATE (PF) 100 MCG/2ML IJ SOLN
INTRAMUSCULAR | Status: DC | PRN
  Administered 2015-11-21 (×2): 50 ug via INTRAVENOUS

## 2015-11-21 MED ORDER — GLYCOPYRROLATE 0.2 MG/ML IJ SOLN
INTRAMUSCULAR | Status: DC | PRN
  Administered 2015-11-21: 0.2 mg via INTRAVENOUS

## 2015-11-21 MED ORDER — LIDOCAINE HCL (CARDIAC) 20 MG/ML IV SOLN
INTRAVENOUS | Status: DC | PRN
  Administered 2015-11-21: 50 mg via INTRAVENOUS

## 2015-11-21 MED ORDER — LIDOCAINE HCL (PF) 1 % IJ SOLN
INTRAMUSCULAR | Status: AC
  Filled 2015-11-21: qty 60

## 2015-11-21 MED ORDER — SODIUM CHLORIDE 0.9 % IV SOLN
INTRAVENOUS | Status: DC
  Administered 2015-11-21: 12:00:00 via INTRAVENOUS

## 2015-11-21 MED ORDER — ONDANSETRON HCL 4 MG/2ML IJ SOLN: 4.0000 mg | Freq: Once | INTRAMUSCULAR | Status: DC | PRN

## 2015-11-21 MED ORDER — BUPIVACAINE-EPINEPHRINE (PF) 0.5% -1:200000 IJ SOLN
INTRAMUSCULAR | Status: DC | PRN
  Administered 2015-11-21: 20 mL

## 2015-11-21 MED ORDER — LIDOCAINE HCL 1 % IJ SOLN
INTRAMUSCULAR | Status: DC | PRN
  Administered 2015-11-21: 10 mL

## 2015-11-21 MED ORDER — CLINDAMYCIN PHOSPHATE 600 MG/50ML IV SOLN
600.0000 mg | Freq: Once | INTRAVENOUS | Status: AC
  Administered 2015-11-21: 600 mg via INTRAVENOUS

## 2015-11-21 SURGICAL SUPPLY — 12 items
CEMENT KYPHON CX01A KIT/MIXER (Cement) ×3 IMPLANT
DEVICE BIOPSY BONE KYPHX (INSTRUMENTS) ×3 IMPLANT
DRAPE C-ARM XRAY 36X54 (DRAPES) ×3 IMPLANT
DURAPREP 26ML APPLICATOR (WOUND CARE) ×3 IMPLANT
GLOVE SURG ORTHO 9.0 STRL STRW (GLOVE) ×3 IMPLANT
GOWN SPECIALTY ULTRA XL (MISCELLANEOUS) ×3 IMPLANT
GOWN STRL REUS W/ TWL LRG LVL3 (GOWN DISPOSABLE) ×1 IMPLANT
GOWN STRL REUS W/TWL LRG LVL3 (GOWN DISPOSABLE) ×2
LIQUID BAND (GAUZE/BANDAGES/DRESSINGS) ×3 IMPLANT
PACK KYPHOPLASTY (MISCELLANEOUS) ×3 IMPLANT
STRAP SAFETY BODY (MISCELLANEOUS) ×3 IMPLANT
TRAY KYPHOPAK 15/3 EXPRESS 1ST (MISCELLANEOUS) ×3 IMPLANT

## 2015-11-21 NOTE — H&P (Signed)
Reviewed paper H+P, will be scanned into chart. No changes noted.  

## 2015-11-21 NOTE — Transfer of Care (Signed)
Immediate Anesthesia Transfer of Care Note  Patient: Lindsey Shaw  Procedure(s) Performed: Procedure(s): KYPHOPLASTY THORACIC 12 (N/A)  Patient Location: PACU  Anesthesia Type:General  Level of Consciousness: awake and alert   Airway & Oxygen Therapy: Patient Spontanous Breathing and Patient connected to nasal cannula oxygen  Post-op Assessment: Report given to RN  Post vital signs: Reviewed  Last Vitals:  Filed Vitals:   11/21/15 1221 11/21/15 1339  BP: 135/75 118/97  Pulse: 74 75  Temp: 37.1 C 37.1 C  Resp: 16 13    Complications: No apparent anesthesia complications

## 2015-11-21 NOTE — Op Note (Signed)
11/21/2015  1:43 PM  PATIENT:  Lindsey Shaw  74 y.o. female  PRE-OPERATIVE DIAGNOSIS:  COMPRESSION FX T12  POST-OPERATIVE DIAGNOSIS:  compression fracture thoracic 12  PROCEDURE:  Procedure(s): KYPHOPLASTY THORACIC 12 (N/A)  SURGEON: Laurene Footman, MD  ASSISTANTS: None  ANESTHESIA:   local and MAC  EBL:  Total I/O In: 500 [I.V.:500] Out: -   BLOOD ADMINISTERED:none  DRAINS: none   LOCAL MEDICATIONS USED:  MARCAINE    and XYLOCAINE   SPECIMEN:  Source of Specimen:  T12 vertebral body  DISPOSITION OF SPECIMEN:  PATHOLOGY  COUNTS:  YES  TOURNIQUET:  * No tourniquets in log *  IMPLANTS: Bone cement  DICTATION: .Dragon Dictation patient was brought to the operating room and after adequate sedation was given, C-arm was brought in and with the patient was placed prone and excellent visualization of T12 in both AP and lateral projections was obtained. Appropriate patient identification and timeout procedures were completed and alcohol was used to prep the skin. 5 cc 1% Xylocaine was infiltrated subcutaneously on both sides of the area of the planned incision. The back was then prepped and draped in sterile fashion and repeat timeout procedure completed. Spinal needle was then used to get down to the pedicle on each side with a 50-50 mix of 1% Xylocaine have percent Sensorcaine with epinephrine infiltrated from the bone to the skin. After this was done on both sides on the right side a small incision was made and a trocar advanced in a perpendicular approach into the vertebral body and biopsy obtained. Drilling was carried out and the balloon was inflated to 2 and half cc. With partial correction of the endplate deformity. Identical procedure carried out on the left side with more of a transpedicular approach. The balloons were inflated to a total of 4 cc on each side giving good correction of the inferior endplate deformity. Cement was mixed and when it was the appropriate  consistency both sides were filled with a total of 8 cc of bone cement and placed with about extravasation except for small amount into the disc space. When the cemented set trochars removed and incisions closed with Dermabond followed by Band-Aids  PLAN OF CARE: Discharge to home after PACU  PATIENT DISPOSITION:  PACU - hemodynamically stable.

## 2015-11-21 NOTE — Discharge Instructions (Addendum)
Remove Band-Aid's on Thursday and okay to shower after that. Try to do minimal activity today and tomorrow, resume normal activities on Thursday as pain allows.AMBULATORY SURGERY  DISCHARGE INSTRUCTIONS   1) The drugs that you were given will stay in your system until tomorrow so for the next 24 hours you should not:  A) Drive an automobile B) Make any legal decisions C) Drink any alcoholic beverage   2) You may resume regular meals tomorrow.  Today it is better to start with liquids and gradually work up to solid foods.  You may eat anything you prefer, but it is better to start with liquids, then soup and crackers, and gradually work up to solid foods.   3) Please notify your doctor immediately if you have any unusual bleeding, trouble breathing, redness and pain at the surgery site, drainage, fever, or pain not relieved by medication.    4) Additional Instructions:        Please contact your physician with any problems or Same Day Surgery at [phone], Monday through Friday 6 am to 4 pm, or [place] at [facility] number at [phone].

## 2015-11-21 NOTE — Anesthesia Postprocedure Evaluation (Signed)
Anesthesia Post Note  Patient: Lindsey Shaw  Procedure(s) Performed: Procedure(s) (LRB): KYPHOPLASTY THORACIC 12 (N/A)  Patient location during evaluation: PACU Anesthesia Type: MAC Level of consciousness: awake Pain management: pain level controlled Vital Signs Assessment: post-procedure vital signs reviewed and stable Respiratory status: spontaneous breathing Cardiovascular status: blood pressure returned to baseline    Last Vitals:  Filed Vitals:   11/21/15 1426 11/21/15 1451  BP: 144/59 154/65  Pulse: 66 64  Temp: 36.9 C   Resp: 14 16    Last Pain: There were no vitals filed for this visit.               Forest Pruden S

## 2015-11-21 NOTE — Anesthesia Preprocedure Evaluation (Addendum)
Anesthesia Evaluation  Patient identified by MRN, date of birth, ID band Patient awake    Reviewed: Allergy & Precautions, NPO status , Patient's Chart, lab work & pertinent test results, reviewed documented beta blocker date and time   Airway Mallampati: II  TM Distance: >3 FB     Dental  (+) Chipped   Pulmonary           Cardiovascular hypertension, Pt. on medications + Past MI and + Peripheral Vascular Disease       Neuro/Psych PSYCHIATRIC DISORDERS Anxiety Depression    GI/Hepatic GERD  ,  Endo/Other  diabetes, Type 2  Renal/GU Renal InsufficiencyRenal disease     Musculoskeletal  (+) Arthritis ,   Abdominal   Peds  Hematology   Anesthesia Other Findings Gout.MI long time ago, ok now.  Reproductive/Obstetrics                            Anesthesia Physical Anesthesia Plan  ASA: III  Anesthesia Plan:    Post-op Pain Management:    Induction:   Airway Management Planned: Nasal Cannula  Additional Equipment:   Intra-op Plan:   Post-operative Plan:   Informed Consent: I have reviewed the patients History and Physical, chart, labs and discussed the procedure including the risks, benefits and alternatives for the proposed anesthesia with the patient or authorized representative who has indicated his/her understanding and acceptance.     Plan Discussed with: CRNA  Anesthesia Plan Comments:         Anesthesia Quick Evaluation

## 2015-11-22 LAB — SURGICAL PATHOLOGY

## 2015-11-23 ENCOUNTER — Ambulatory Visit: Payer: Medicare Other

## 2015-12-27 ENCOUNTER — Other Ambulatory Visit: Payer: Self-pay | Admitting: Internal Medicine

## 2015-12-27 DIAGNOSIS — Z1231 Encounter for screening mammogram for malignant neoplasm of breast: Secondary | ICD-10-CM

## 2016-01-01 ENCOUNTER — Ambulatory Visit
Admission: RE | Admit: 2016-01-01 | Discharge: 2016-01-01 | Disposition: A | Discharge status: Home / Self Care | Payer: Medicare Other | Source: Ambulatory Visit | Attending: Internal Medicine | Admitting: Internal Medicine

## 2016-01-01 ENCOUNTER — Other Ambulatory Visit: Payer: Self-pay | Admitting: Internal Medicine

## 2016-01-01 DIAGNOSIS — Z1231 Encounter for screening mammogram for malignant neoplasm of breast: Secondary | ICD-10-CM | POA: Insufficient documentation

## 2016-01-22 ENCOUNTER — Encounter: Payer: Self-pay | Admitting: Cardiovascular Disease

## 2016-01-22 ENCOUNTER — Ambulatory Visit (INDEPENDENT_AMBULATORY_CARE_PROVIDER_SITE_OTHER): Payer: Medicare Other | Admitting: Cardiovascular Disease

## 2016-01-22 VITALS — BP 128/60 | HR 61 | Ht 65.0 in | Wt 169.8 lb

## 2016-01-22 DIAGNOSIS — Z87898 Personal history of other specified conditions: Secondary | ICD-10-CM | POA: Insufficient documentation

## 2016-01-22 DIAGNOSIS — I10 Essential (primary) hypertension: Secondary | ICD-10-CM

## 2016-01-22 DIAGNOSIS — E1165 Type 2 diabetes mellitus with hyperglycemia: Secondary | ICD-10-CM

## 2016-01-22 DIAGNOSIS — IMO0001 Reserved for inherently not codable concepts without codable children: Secondary | ICD-10-CM | POA: Insufficient documentation

## 2016-01-22 DIAGNOSIS — R9431 Abnormal electrocardiogram [ECG] [EKG]: Secondary | ICD-10-CM | POA: Insufficient documentation

## 2016-01-22 DIAGNOSIS — E785 Hyperlipidemia, unspecified: Secondary | ICD-10-CM | POA: Diagnosis not present

## 2016-01-22 NOTE — Progress Notes (Signed)
Patient ID: Lindsey Shaw, female    DOB: 1941-08-20, 75 y.o.   MRN: [id number]  HPI Comments: Lindsey Shaw is a very pleasant 75 year old woman with history of diabetes, hyperlipidemia, recent back pain November 2016 requiring vertebroplasty, presenting for second opinion regarding abnormal EKG.  Recently seen by Dr. Clayborn Bigness for preoperative evaluation prior to back surgery. She was cleared for surgery the recommendation was made to have stress test and echocardiogram following the surgery based on abnormal EKG   surgery went uneventfully. She does not feel that she needs the stress test or echocardiogram as she feels well with no complaints. Since the back surgery, she reports that she is active, vacuuming, use the carpet shampoo machine yesterday without any significant symptoms. Denies any chest pain or shortness of breath on exertion  Reports having severe chest pain back in 2007, stress test at that time, was told this was normal She does not know but felt she may have had a small heart attack. She has not been told this  For the past month she has had episodes of sweating sometimes presenting at nighttime when she has covers on, occasionally in the daytime She wonders if it could be from the medications. She is no longer on pain medication The only new medication since symptoms started was the calcium pill she started on her own  Lab work reviewed with her showing hemoglobin A1c 7.3. She is trying to watch her diet Total cholesterol 150. Tolerating her statin  EKG on today's visit shows normal sinus rhythm with rate 61 bpm, poor R-wave progression through the anterior precordial leads, no significant ST or T-wave changes  EKG from December 2016 shows normal sinus rhythm with no significant ST or T-wave changes    Allergies  Allergen Reactions  . Keflex [Cephalexin] Swelling    Current Outpatient Prescriptions on File Prior to Visit  Medication Sig Dispense Refill  .  aspirin EC 81 MG tablet Take 81 mg by mouth daily.    . citalopram (CELEXA) 20 MG tablet Take 20 mg by mouth every evening.    . colchicine 0.6 MG tablet Take 0.6 mg by mouth daily as needed (gout).    Marland Kitchen docusate sodium (COLACE) 100 MG capsule Take 100 mg by mouth daily as needed for mild constipation.    Marland Kitchen losartan-hydrochlorothiazide (HYZAAR) 100-25 MG per tablet Take 1 tablet by mouth daily.    Marland Kitchen lovastatin (MEVACOR) 40 MG tablet Take 40 mg by mouth at bedtime.    . metFORMIN (GLUCOPHAGE) 500 MG tablet Take 500 mg by mouth every evening.     Marland Kitchen omeprazole (PRILOSEC) 20 MG capsule Take 20 mg by mouth every evening.     No current facility-administered medications on file prior to visit.    Past Medical History  Diagnosis Date  . Mental disorder   . Depression   . Diabetes mellitus without complication ([place])   . Blood clotting tendency     2006   BLOOD CLOTT LEG TO GROIN  . GERD (gastroesophageal reflux disease)   . Hypertension   . Myocardial infarction ([place])     10+ YRS (Lexiscan: no evid of ischemia/infarct, EF 72% 06/23/12 [facility])  . Anxiety   . Chronic kidney disease     Stage 3 Kidney Disease  . Arthritis   . 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[place]) 2006    Left leg  . Cancer ([place])     MELANOMA   . Hyperlipidemia     Past Surgical  History  Procedure Laterality Date  . Tubal ligation      73  . Toe surgery      BIL GREAT TOE JOINT REMOVED  . Cholecystectomy    . Back surgery      2007  . Lumbar laminectomy/decompression microdiscectomy Right 06/28/2013    Procedure: LUMBAR LAMINECTOMY/DECOMPRESSION MICRODISCECTOMY 1 LEVEL;  Surgeon: Elaina Hoops, MD;  Location: [place] [facility];  Service: Neurosurgery;  Laterality: Right;  Lumbar Laminectomy Decompression Lumbar Four-Five Right  . Kyphoplasty N/A 11/21/2015    Procedure: KYPHOPLASTY THORACIC 12;  Surgeon: Hessie Knows, MD;  Location: [facility] ORS;  Service: Orthopedics;  Laterality: N/A;    Social History  reports that she has never  smoked. She has never used smokeless tobacco. She reports that she does not drink alcohol or use illicit drugs.  Family History family history includes AAA (abdominal aortic aneurysm) in her paternal aunt; Breast cancer in her maternal aunt and maternal uncle; Lung cancer in her father; Ovarian cancer in her mother; Pancreatic cancer in her mother.   Review of Systems  Constitutional: Negative.   Respiratory: Negative.   Cardiovascular: Negative.   Gastrointestinal: Negative.   Musculoskeletal: Negative.   Neurological: Negative.   Hematological: Negative.   Psychiatric/Behavioral: Negative.   All other systems reviewed and are negative.   BP 128/60 mmHg  Pulse 61  Ht 5\' 5"  (1.651 m)  Wt 169 lb 12 oz (76.998 kg)  BMI 28.25 kg/m2  Physical Exam  Constitutional: She is oriented to person, place, and time. She appears well-developed and well-nourished.  HENT:  Head: Normocephalic.  Nose: Nose normal.  Mouth/Throat: Oropharynx is clear and moist.  Eyes: Conjunctivae are normal. Pupils are equal, round, and reactive to light.  Neck: Normal range of motion. Neck supple. No JVD present.  Cardiovascular: Normal rate, regular rhythm, normal heart sounds and intact distal pulses.  Exam reveals no gallop and no friction rub.   No murmur heard. Pulmonary/Chest: Effort normal and breath sounds normal. No respiratory distress. She has no wheezes. She has no rales. She exhibits no tenderness.  Abdominal: Soft. Bowel sounds are normal. She exhibits no distension. There is no tenderness.  Musculoskeletal: Normal range of motion. She exhibits no edema or tenderness.  Lymphadenopathy:    She has no cervical adenopathy.  Neurological: She is alert and oriented to person, place, and time. Coordination normal.  Skin: Skin is warm and dry. No rash noted. No erythema.  Psychiatric: She has a normal mood and affect. Her behavior is normal. Judgment and thought content normal.

## 2016-01-22 NOTE — Assessment & Plan Note (Signed)
Long discussion with her concerning her prior history of chest pain. Etiology 10 years ago was uncertain.  EKG does not suggest old infarct.  Currently asymptomatic with no chest pain or shortness of breath on exertion.  She feels well with no dramatic EKG changes, she does not want stress testing or echocardiogram.  Clinical exam essentially benign with no concern for heart failure or valve problem.  Recommend she call our office or Dr. Clayborn Bigness  If she has any symptoms concerning for angina

## 2016-01-22 NOTE — Assessment & Plan Note (Signed)
She reports that she likes to cook, eats some of her cooking , hemoglobin A1c elevated more than 7.  Numbers only mildly elevated, encouraged her to watch her carbohydrates , increase her exercise

## 2016-01-22 NOTE — Assessment & Plan Note (Signed)
I have reviewed several of the previous EKGs from 2013, 14, 16 and today  No significant changes concerning for ischemia or infarct.  Poor R-wave progression through the anterior precordial leads today likely secondary to lead placement.  Prior EKG December 2016 shows sinus bradycardia otherwise normal EKG

## 2016-01-22 NOTE — Assessment & Plan Note (Signed)
Cholesterol is at goal on the current lipid regimen. No changes to the medications were made.  

## 2016-01-22 NOTE — Assessment & Plan Note (Addendum)
Blood pressure is well controlled on today's visit. No changes made to the medications.   Total encounter time more than 45 minutes  Greater than 50% was spent in counseling and coordination of care with the patient

## 2016-01-22 NOTE — Patient Instructions (Addendum)
You are doing well. No medication changes were made. EKG is not abnormal concerning for heart attack  Please call if you have chest discomfort or shortness of breath  Drink more water for kidneys  Research CT coronary calcium score (google, images)  Please call us if you have new issues that need to be addressed before your next appt.

## 2016-10-25 ENCOUNTER — Other Ambulatory Visit: Payer: Self-pay | Admitting: Physician Assistant

## 2016-10-25 ENCOUNTER — Ambulatory Visit
Admission: RE | Admit: 2016-10-25 | Discharge: 2016-10-25 | Disposition: A | Discharge status: Home / Self Care | Payer: Medicare Other | Source: Ambulatory Visit | Attending: Physician Assistant | Admitting: Physician Assistant

## 2016-10-25 DIAGNOSIS — M79661 Pain in right lower leg: Secondary | ICD-10-CM | POA: Diagnosis present

## 2016-10-25 DIAGNOSIS — M79662 Pain in left lower leg: Secondary | ICD-10-CM | POA: Diagnosis present

## 2016-10-29 ENCOUNTER — Encounter: Payer: Self-pay | Admitting: *Deleted

## 2016-10-30 ENCOUNTER — Ambulatory Visit: Payer: Medicare Other

## 2016-11-05 ENCOUNTER — Encounter
Admission: RE | Disposition: A | Discharge status: Home / Self Care | Payer: Self-pay | Source: Ambulatory Visit | Attending: Ophthalmology

## 2016-11-05 ENCOUNTER — Ambulatory Visit: Payer: Medicare Other | Admitting: Certified Registered Nurse Anesthetist

## 2016-11-05 ENCOUNTER — Encounter: Payer: Self-pay | Admitting: *Deleted

## 2016-11-05 ENCOUNTER — Ambulatory Visit
Admission: RE | Admit: 2016-11-05 | Discharge: 2016-11-05 | Disposition: A | Discharge status: Home / Self Care | Payer: Medicare Other | Source: Ambulatory Visit | Attending: Ophthalmology | Admitting: Ophthalmology

## 2016-11-05 DIAGNOSIS — K219 Gastro-esophageal reflux disease without esophagitis: Secondary | ICD-10-CM | POA: Diagnosis not present

## 2016-11-05 DIAGNOSIS — F419 Anxiety disorder, unspecified: Secondary | ICD-10-CM | POA: Diagnosis not present

## 2016-11-05 DIAGNOSIS — I10 Essential (primary) hypertension: Secondary | ICD-10-CM | POA: Diagnosis not present

## 2016-11-05 DIAGNOSIS — M1990 Unspecified osteoarthritis, unspecified site: Secondary | ICD-10-CM | POA: Insufficient documentation

## 2016-11-05 DIAGNOSIS — Z7984 Long term (current) use of oral hypoglycemic drugs: Secondary | ICD-10-CM | POA: Insufficient documentation

## 2016-11-05 DIAGNOSIS — Z880 Allergy status to penicillin: Secondary | ICD-10-CM | POA: Diagnosis not present

## 2016-11-05 DIAGNOSIS — E1151 Type 2 diabetes mellitus with diabetic peripheral angiopathy without gangrene: Secondary | ICD-10-CM | POA: Diagnosis not present

## 2016-11-05 DIAGNOSIS — Z85820 Personal history of malignant melanoma of skin: Secondary | ICD-10-CM | POA: Diagnosis not present

## 2016-11-05 DIAGNOSIS — H2512 Age-related nuclear cataract, left eye: Secondary | ICD-10-CM | POA: Diagnosis present

## 2016-11-05 DIAGNOSIS — Z7982 Long term (current) use of aspirin: Secondary | ICD-10-CM | POA: Insufficient documentation

## 2016-11-05 DIAGNOSIS — F329 Major depressive disorder, single episode, unspecified: Secondary | ICD-10-CM | POA: Insufficient documentation

## 2016-11-05 DIAGNOSIS — M109 Gout, unspecified: Secondary | ICD-10-CM | POA: Insufficient documentation

## 2016-11-05 DIAGNOSIS — I252 Old myocardial infarction: Secondary | ICD-10-CM | POA: Diagnosis not present

## 2016-11-05 DIAGNOSIS — E785 Hyperlipidemia, unspecified: Secondary | ICD-10-CM | POA: Diagnosis not present

## 2016-11-05 DIAGNOSIS — Z881 Allergy status to other antibiotic agents status: Secondary | ICD-10-CM | POA: Insufficient documentation

## 2016-11-05 DIAGNOSIS — Z86718 Personal history of other venous thrombosis and embolism: Secondary | ICD-10-CM | POA: Insufficient documentation

## 2016-11-05 HISTORY — PX: CATARACT EXTRACTION W/PHACO: SHX586

## 2016-11-05 HISTORY — DX: Gout, unspecified: M10.9

## 2016-11-05 LAB — GLUCOSE, CAPILLARY: Glucose-Capillary: 177 mg/dL — ABNORMAL HIGH (ref 65–99)

## 2016-11-05 SURGERY — PHACOEMULSIFICATION, CATARACT, WITH IOL INSERTION
Anesthesia: Monitor Anesthesia Care | Site: Eye | Laterality: Left | Wound class: Clean

## 2016-11-05 MED ORDER — ARMC OPHTHALMIC DILATING DROPS
1.0000 "application " | OPHTHALMIC | Status: AC
  Administered 2016-11-05 (×3): 1 via OPHTHALMIC

## 2016-11-05 MED ORDER — NA CHONDROIT SULF-NA HYALURON 40-17 MG/ML IO SOLN
INTRAOCULAR | Status: AC
  Filled 2016-11-05: qty 1

## 2016-11-05 MED ORDER — MOXIFLOXACIN HCL 0.5 % OP SOLN
OPHTHALMIC | Status: DC | PRN
  Administered 2016-11-05: 9 [drp] via OPHTHALMIC

## 2016-11-05 MED ORDER — EPINEPHRINE PF 1 MG/ML IJ SOLN
INTRAOCULAR | Status: DC | PRN
  Administered 2016-11-05: 200 mL via OPHTHALMIC

## 2016-11-05 MED ORDER — NA CHONDROIT SULF-NA HYALURON 40-17 MG/ML IO SOLN
INTRAOCULAR | Status: DC | PRN
  Administered 2016-11-05: 1 mL via INTRAOCULAR

## 2016-11-05 MED ORDER — MIDAZOLAM HCL 2 MG/2ML IJ SOLN
INTRAMUSCULAR | Status: DC | PRN
  Administered 2016-11-05: 1 mg via INTRAVENOUS

## 2016-11-05 MED ORDER — FENTANYL CITRATE (PF) 100 MCG/2ML IJ SOLN
25.0000 ug | INTRAMUSCULAR | Status: AC | PRN
  Administered 2018-04-06: 25 ug via INTRAVENOUS
  Administered 2018-04-06: 50 ug via INTRAVENOUS
  Administered 2018-04-06: 25 ug via INTRAVENOUS

## 2016-11-05 MED ORDER — MOXIFLOXACIN HCL 0.5 % OP SOLN: 1.0000 [drp] | OPHTHALMIC | Status: AC

## 2016-11-05 MED ORDER — EPINEPHRINE PF 1 MG/ML IJ SOLN
INTRAMUSCULAR | Status: AC
  Filled 2016-11-05: qty 2

## 2016-11-05 MED ORDER — ONDANSETRON HCL 4 MG/2ML IJ SOLN: 4.0000 mg | Freq: Once | INTRAMUSCULAR | Status: AC | PRN

## 2016-11-05 MED ORDER — MOXIFLOXACIN HCL 0.5 % OP SOLN
1.0000 [drp] | OPHTHALMIC | Status: AC
  Administered 2016-11-05 (×3): 1 [drp] via OPHTHALMIC

## 2016-11-05 MED ORDER — ARMC OPHTHALMIC DILATING DROPS
OPHTHALMIC | Status: AC
  Administered 2016-11-05: 1 via OPHTHALMIC
  Filled 2016-11-05: qty 0.4

## 2016-11-05 MED ORDER — LIDOCAINE HCL (PF) 4 % IJ SOLN
INTRAOCULAR | Status: DC | PRN
  Administered 2016-11-05: 4 mL via OPHTHALMIC

## 2016-11-05 MED ORDER — ARMC OPHTHALMIC DILATING DROPS: 1.0000 "application " | OPHTHALMIC | Status: AC

## 2016-11-05 MED ORDER — LIDOCAINE HCL (PF) 4 % IJ SOLN
INTRAMUSCULAR | Status: AC
  Filled 2016-11-05: qty 5

## 2016-11-05 MED ORDER — SODIUM CHLORIDE 0.9 % IV SOLN
INTRAVENOUS | Status: DC
  Administered 2016-11-05: 09:00:00 via INTRAVENOUS

## 2016-11-05 MED ORDER — FENTANYL CITRATE (PF) 100 MCG/2ML IJ SOLN
INTRAMUSCULAR | Status: DC | PRN
  Administered 2016-11-05: 50 ug via INTRAVENOUS

## 2016-11-05 MED ORDER — CARBACHOL 0.01 % IO SOLN
INTRAOCULAR | Status: DC | PRN
  Administered 2016-11-05: 0.5 mL via INTRAOCULAR

## 2016-11-05 MED ORDER — MOXIFLOXACIN HCL 0.5 % OP SOLN
OPHTHALMIC | Status: AC
  Administered 2016-11-05: 1 [drp] via OPHTHALMIC
  Filled 2016-11-05: qty 3

## 2016-11-05 MED ORDER — POVIDONE-IODINE 5 % OP SOLN
OPHTHALMIC | Status: AC
  Filled 2016-11-05: qty 30

## 2016-11-05 SURGICAL SUPPLY — 21 items
CANNULA ANT/CHMB 27GA (MISCELLANEOUS) ×3 IMPLANT
CUP MEDICINE 2OZ PLAST GRAD ST (MISCELLANEOUS) ×3 IMPLANT
GLOVE BIO SURGEON STRL SZ8 (GLOVE) ×3 IMPLANT
GLOVE BIOGEL M 6.5 STRL (GLOVE) ×3 IMPLANT
GLOVE SURG LX 8.0 MICRO (GLOVE) ×2
GLOVE SURG LX STRL 8.0 MICRO (GLOVE) ×1 IMPLANT
GOWN STRL REUS W/ TWL LRG LVL3 (GOWN DISPOSABLE) ×2 IMPLANT
GOWN STRL REUS W/TWL LRG LVL3 (GOWN DISPOSABLE) ×4
LENS IOL TECNIS ITEC 22.0 (Intraocular Lens) ×3 IMPLANT
PACK CATARACT (MISCELLANEOUS) ×3 IMPLANT
PACK CATARACT BRASINGTON LX (MISCELLANEOUS) ×3 IMPLANT
PACK EYE AFTER SURG (MISCELLANEOUS) ×3 IMPLANT
SOL BSS BAG (MISCELLANEOUS) ×3
SOL PREP PVP 2OZ (MISCELLANEOUS) ×3
SOLUTION BSS BAG (MISCELLANEOUS) ×1 IMPLANT
SOLUTION PREP PVP 2OZ (MISCELLANEOUS) ×1 IMPLANT
SYR 3ML LL SCALE MARK (SYRINGE) ×3 IMPLANT
SYR 5ML LL (SYRINGE) ×3 IMPLANT
SYR TB 1ML 27GX1/2 LL (SYRINGE) ×3 IMPLANT
WATER STERILE IRR 250ML POUR (IV SOLUTION) ×3 IMPLANT
WIPE NON LINTING 3.25X3.25 (MISCELLANEOUS) ×3 IMPLANT

## 2016-11-05 NOTE — Anesthesia Procedure Notes (Signed)
Procedure Name: MAC Performed by: Tanay Misuraca Pre-anesthesia Checklist: Patient identified, Emergency Drugs available, Suction available, Patient being monitored and Timeout performed Oxygen Delivery Method: Nasal cannula       

## 2016-11-05 NOTE — Anesthesia Postprocedure Evaluation (Signed)
Anesthesia Post Note  Patient: Lindsey Shaw  Procedure(s) Performed: Procedure(s) (LRB): CATARACT EXTRACTION PHACO AND INTRAOCULAR LENS PLACEMENT (IOC) (Left)  Patient location during evaluation: PACU Anesthesia Type: MAC Level of consciousness: awake and alert and oriented Pain management: pain level controlled Vital Signs Assessment: post-procedure vital signs reviewed and stable Respiratory status: spontaneous breathing Cardiovascular status: blood pressure returned to baseline Anesthetic complications: no    Last Vitals:  Vitals:   11/05/16 0838 11/05/16 0839  BP: (!) 120/101 (!) 158/81  Pulse: 67   Resp: 16   Temp: 36.4 C     Last Pain:  Vitals:   11/05/16 0838  TempSrc: Oral  PainSc: 7                  Blima Singer

## 2016-11-05 NOTE — Discharge Instructions (Signed)
Eye Surgery Discharge Instructions  Expect mild scratchy sensation or mild soreness. DO NOT RUB YOUR EYE!  The day of surgery:  Minimal physical activity, but bed rest is not required  No reading, computer work, or close hand work  No bending, lifting, or straining.  May watch TV  For 24 hours:  No driving, legal decisions, or alcoholic beverages  Safety precautions  Eat anything you prefer: It is better to start with liquids, then soup then solid foods.  _____ Eye patch should be worn until postoperative exam tomorrow.  ____ Solar shield eyeglasses should be worn for comfort in the sunlight/patch while sleeping  Resume all regular medications including aspirin or Coumadin if these were discontinued prior to surgery. You may shower, bathe, shave, or wash your hair. Tylenol may be taken for mild discomfort.  Call your doctor if you experience significant pain, nausea, or vomiting, fever > 101 or other signs of infection. [phone] or [phone] Specific instructions:  Follow-up Information    PORFILIO,WILLIAM LOUIS, MD Follow up.   Specialty:  Ophthalmology Why:  November 22 at 8:20am Contact information: [address] [place] [address] [phone]

## 2016-11-05 NOTE — Op Note (Signed)
PREOPERATIVE DIAGNOSIS:  Nuclear sclerotic cataract of the left eye.   POSTOPERATIVE DIAGNOSIS:  Nuclear sclerotic cataract of the left eye.   OPERATIVE PROCEDURE: Procedure(s): CATARACT EXTRACTION PHACO AND INTRAOCULAR LENS PLACEMENT (IOC)   SURGEON:  Birder Robson, MD.   ANESTHESIA:  Anesthesiologist: Alvin Critchley, MD CRNA: Demetrius Charity, CRNA  1.      Managed anesthesia care. 2.     0.2ml of Shugarcaine was instilled following the paracentesis   COMPLICATIONS:  None.   TECHNIQUE:   Stop and chop   DESCRIPTION OF PROCEDURE:  The patient was examined and consented in the preoperative holding area where the aforementioned topical anesthesia was applied to the left eye and then brought back to the Operating Room where the left eye was prepped and draped in the usual sterile ophthalmic fashion and a lid speculum was placed. A paracentesis was created with the side port blade and the anterior chamber was filled with viscoelastic. A near clear corneal incision was performed with the steel keratome. A continuous curvilinear capsulorrhexis was performed with a cystotome followed by the capsulorrhexis forceps. Hydrodissection and hydrodelineation were carried out with BSS on a blunt cannula. The lens was removed in a stop and chop  technique and the remaining cortical material was removed with the irrigation-aspiration handpiece. The capsular bag was inflated with viscoelastic and the Technis [id number] lens was placed in the capsular bag without complication. The remaining viscoelastic was removed from the eye with the irrigation-aspiration handpiece. The wounds were hydrated. The anterior chamber was flushed with Miostat and the eye was inflated to physiologic pressure. 0.78ml Vigamox was placed in the anterior chamber. The wounds were found to be water tight. The eye was dressed with Vigamox. The patient was given protective glasses to wear throughout the day and a shield with which to sleep tonight.  The patient was also given drops with which to begin a drop regimen today and will follow-up with me in one day.  Implant Name Type Inv. Item Serial No. Manufacturer Lot No. LRB No. Used  LENS IOL DIOP 22.0 - [id number] Intraocular Lens LENS IOL DIOP 22.0 [id number] AMO   Left 1    Procedure(s) with comments: CATARACT EXTRACTION PHACO AND INTRAOCULAR LENS PLACEMENT (IOC) (Left) - Lot # [id number] H [place]: 01:16.4 AP%:24.0 CDE: 18.29  Electronically signed: [place] 11/05/2016 10:12 AM

## 2016-11-05 NOTE — Anesthesia Preprocedure Evaluation (Signed)
Anesthesia Evaluation  Patient identified by MRN, date of birth, ID band Patient awake    Reviewed: Allergy & Precautions, NPO status , Patient's Chart, lab work & pertinent test results, reviewed documented beta blocker date and time   Airway Mallampati: II  TM Distance: >3 FB     Dental  (+) Chipped   Pulmonary           Cardiovascular hypertension, Pt. on medications + Past MI and + Peripheral Vascular Disease       Neuro/Psych PSYCHIATRIC DISORDERS Anxiety Depression    GI/Hepatic GERD  ,  Endo/Other  diabetes, Type 2  Renal/GU Renal InsufficiencyRenal disease     Musculoskeletal  (+) Arthritis ,   Abdominal   Peds  Hematology   Anesthesia Other Findings Gout.MI long time ago, ok now.  Reproductive/Obstetrics                             Anesthesia Physical  Anesthesia Plan  ASA: III  Anesthesia Plan: MAC   Post-op Pain Management:    Induction:   Airway Management Planned: Nasal Cannula  Additional Equipment:   Intra-op Plan:   Post-operative Plan:   Informed Consent: I have reviewed the patients History and Physical, chart, labs and discussed the procedure including the risks, benefits and alternatives for the proposed anesthesia with the patient or authorized representative who has indicated his/her understanding and acceptance.     Plan Discussed with: CRNA  Anesthesia Plan Comments:         Anesthesia Quick Evaluation

## 2016-11-05 NOTE — H&P (Signed)
All labs reviewed. Abnormal studies sent to patients PCP when indicated.  Previous H&P reviewed, patient examined, there are NO CHANGES.  Lindsey Shaw LOUIS11/21/20179:48 AM

## 2016-11-05 NOTE — Transfer of Care (Signed)
Immediate Anesthesia Transfer of Care Note  Patient: Lindsey Shaw  Procedure(s) Performed: Procedure(s) with comments: CATARACT EXTRACTION PHACO AND INTRAOCULAR LENS PLACEMENT ([place]) (Left) - Lot # [id number] H [place]: 01:16.4 AP%:24.0 CDE: 18.29  Patient Location: PACU  Anesthesia Type:MAC  Level of Consciousness: awake, alert  and oriented  Airway & Oxygen Therapy: Patient Spontanous Breathing  Post-op Assessment: Report given to RN and Post -op Vital signs reviewed and stable  Post vital signs: Reviewed and stable  Last Vitals:  Vitals:   11/05/16 0838 11/05/16 0839  BP: (!) 120/101 (!) 158/81  Pulse: 67   Resp: 16   Temp: 36.4 C     Last Pain:  Vitals:   11/05/16 0838  TempSrc: Oral  PainSc: 7          Complications: No apparent anesthesia complications

## 2016-11-19 ENCOUNTER — Encounter: Payer: Self-pay | Admitting: *Deleted

## 2016-11-20 ENCOUNTER — Other Ambulatory Visit: Payer: Self-pay | Admitting: Internal Medicine

## 2016-11-22 ENCOUNTER — Other Ambulatory Visit: Payer: Self-pay | Admitting: Internal Medicine

## 2016-11-22 DIAGNOSIS — Z1231 Encounter for screening mammogram for malignant neoplasm of breast: Secondary | ICD-10-CM

## 2016-11-26 ENCOUNTER — Encounter: Payer: Self-pay | Admitting: *Deleted

## 2016-11-26 ENCOUNTER — Ambulatory Visit: Payer: Medicare Other | Admitting: Anesthesiology

## 2016-11-26 ENCOUNTER — Ambulatory Visit
Admission: RE | Admit: 2016-11-26 | Discharge: 2016-11-26 | Disposition: A | Discharge status: Home / Self Care | Payer: Medicare Other | Source: Ambulatory Visit | Attending: Ophthalmology | Admitting: Ophthalmology

## 2016-11-26 ENCOUNTER — Encounter
Admission: RE | Disposition: A | Discharge status: Home / Self Care | Payer: Self-pay | Source: Ambulatory Visit | Attending: Ophthalmology

## 2016-11-26 DIAGNOSIS — K219 Gastro-esophageal reflux disease without esophagitis: Secondary | ICD-10-CM | POA: Diagnosis not present

## 2016-11-26 DIAGNOSIS — I252 Old myocardial infarction: Secondary | ICD-10-CM | POA: Diagnosis not present

## 2016-11-26 DIAGNOSIS — M1990 Unspecified osteoarthritis, unspecified site: Secondary | ICD-10-CM | POA: Insufficient documentation

## 2016-11-26 DIAGNOSIS — I10 Essential (primary) hypertension: Secondary | ICD-10-CM | POA: Insufficient documentation

## 2016-11-26 DIAGNOSIS — Z7984 Long term (current) use of oral hypoglycemic drugs: Secondary | ICD-10-CM | POA: Insufficient documentation

## 2016-11-26 DIAGNOSIS — Z85828 Personal history of other malignant neoplasm of skin: Secondary | ICD-10-CM | POA: Diagnosis not present

## 2016-11-26 DIAGNOSIS — Z79899 Other long term (current) drug therapy: Secondary | ICD-10-CM | POA: Diagnosis not present

## 2016-11-26 DIAGNOSIS — E1136 Type 2 diabetes mellitus with diabetic cataract: Secondary | ICD-10-CM | POA: Insufficient documentation

## 2016-11-26 DIAGNOSIS — F329 Major depressive disorder, single episode, unspecified: Secondary | ICD-10-CM | POA: Diagnosis not present

## 2016-11-26 DIAGNOSIS — I739 Peripheral vascular disease, unspecified: Secondary | ICD-10-CM | POA: Diagnosis not present

## 2016-11-26 DIAGNOSIS — F419 Anxiety disorder, unspecified: Secondary | ICD-10-CM | POA: Insufficient documentation

## 2016-11-26 HISTORY — PX: CATARACT EXTRACTION W/PHACO: SHX586

## 2016-11-26 LAB — GLUCOSE, CAPILLARY: Glucose-Capillary: 160 mg/dL — ABNORMAL HIGH (ref 65–99)

## 2016-11-26 SURGERY — PHACOEMULSIFICATION, CATARACT, WITH IOL INSERTION
Anesthesia: Monitor Anesthesia Care | Site: Eye | Laterality: Right | Wound class: Clean

## 2016-11-26 MED ORDER — ARMC OPHTHALMIC DILATING DROPS
OPHTHALMIC | Status: AC
  Administered 2016-11-26: 1 via OPHTHALMIC
  Filled 2016-11-26: qty 0.4

## 2016-11-26 MED ORDER — MOXIFLOXACIN HCL 0.5 % OP SOLN
OPHTHALMIC | Status: AC
  Administered 2016-11-26: 1 [drp] via OPHTHALMIC
  Filled 2016-11-26: qty 3

## 2016-11-26 MED ORDER — LIDOCAINE HCL (PF) 4 % IJ SOLN
INTRAOCULAR | Status: DC | PRN
  Administered 2016-11-26: 4 mL via OPHTHALMIC

## 2016-11-26 MED ORDER — MOXIFLOXACIN HCL 0.5 % OP SOLN
1.0000 [drp] | OPHTHALMIC | Status: AC
  Administered 2016-11-26 (×3): 1 [drp] via OPHTHALMIC

## 2016-11-26 MED ORDER — ARMC OPHTHALMIC DILATING DROPS
1.0000 "application " | OPHTHALMIC | Status: AC
  Administered 2016-11-26 (×3): 1 via OPHTHALMIC

## 2016-11-26 MED ORDER — MIDAZOLAM HCL 2 MG/2ML IJ SOLN
INTRAMUSCULAR | Status: DC | PRN
  Administered 2016-11-26: 1 mg via INTRAVENOUS

## 2016-11-26 MED ORDER — NA CHONDROIT SULF-NA HYALURON 40-17 MG/ML IO SOLN
INTRAOCULAR | Status: DC | PRN
  Administered 2016-11-26: 1 mL via INTRAOCULAR

## 2016-11-26 MED ORDER — MOXIFLOXACIN HCL 0.5 % OP SOLN
OPHTHALMIC | Status: DC | PRN
  Administered 2016-11-26: 1 [drp] via OPHTHALMIC

## 2016-11-26 MED ORDER — CARBACHOL 0.01 % IO SOLN
INTRAOCULAR | Status: DC | PRN
Start: 2016-11-26 — End: 2016-11-26
  Administered 2016-11-26: 0.5 mL via INTRAOCULAR

## 2016-11-26 MED ORDER — NA CHONDROIT SULF-NA HYALURON 40-17 MG/ML IO SOLN
INTRAOCULAR | Status: AC
Start: 2016-11-26 — End: 2016-11-26
  Filled 2016-11-26: qty 1

## 2016-11-26 MED ORDER — SODIUM CHLORIDE 0.9 % IV SOLN
INTRAVENOUS | Status: DC
Start: 2016-11-26 — End: 2016-11-26
  Administered 2016-11-26: 09:00:00 via INTRAVENOUS

## 2016-11-26 MED ORDER — FENTANYL CITRATE (PF) 100 MCG/2ML IJ SOLN
INTRAMUSCULAR | Status: DC | PRN
Start: 2016-11-26 — End: 2016-11-26
  Administered 2016-11-26 (×2): 25 ug via INTRAVENOUS

## 2016-11-26 MED ORDER — LIDOCAINE HCL (PF) 4 % IJ SOLN
INTRAMUSCULAR | Status: AC
  Filled 2016-11-26: qty 5

## 2016-11-26 MED ORDER — BSS IO SOLN
INTRAOCULAR | Status: DC | PRN
  Administered 2016-11-26: 09:00:00 via OPHTHALMIC

## 2016-11-26 MED ORDER — POVIDONE-IODINE 5 % OP SOLN
OPHTHALMIC | Status: AC
  Filled 2016-11-26: qty 30

## 2016-11-26 MED ORDER — EPINEPHRINE PF 1 MG/ML IJ SOLN
INTRAMUSCULAR | Status: AC
  Filled 2016-11-26: qty 2

## 2016-11-26 SURGICAL SUPPLY — 21 items
CANNULA ANT/CHMB 27GA (MISCELLANEOUS) ×3 IMPLANT
CUP MEDICINE 2OZ PLAST GRAD ST (MISCELLANEOUS) ×3 IMPLANT
GLOVE BIO SURGEON STRL SZ8 (GLOVE) ×3 IMPLANT
GLOVE BIOGEL M 6.5 STRL (GLOVE) ×3 IMPLANT
GLOVE SURG LX 8.0 MICRO (GLOVE) ×2
GLOVE SURG LX STRL 8.0 MICRO (GLOVE) ×1 IMPLANT
GOWN STRL REUS W/ TWL LRG LVL3 (GOWN DISPOSABLE) ×2 IMPLANT
GOWN STRL REUS W/TWL LRG LVL3 (GOWN DISPOSABLE) ×4
LENS IOL TECNIS ITEC 22.0 (Intraocular Lens) ×3 IMPLANT
PACK CATARACT (MISCELLANEOUS) ×3 IMPLANT
PACK CATARACT BRASINGTON LX (MISCELLANEOUS) ×3 IMPLANT
PACK EYE AFTER SURG (MISCELLANEOUS) ×3 IMPLANT
SOL BSS BAG (MISCELLANEOUS) ×3
SOL PREP PVP 2OZ (MISCELLANEOUS) ×3
SOLUTION BSS BAG (MISCELLANEOUS) ×1 IMPLANT
SOLUTION PREP PVP 2OZ (MISCELLANEOUS) ×1 IMPLANT
SYR 3ML LL SCALE MARK (SYRINGE) ×3 IMPLANT
SYR 5ML LL (SYRINGE) ×3 IMPLANT
SYR TB 1ML 27GX1/2 LL (SYRINGE) ×3 IMPLANT
WATER STERILE IRR 250ML POUR (IV SOLUTION) ×3 IMPLANT
WIPE NON LINTING 3.25X3.25 (MISCELLANEOUS) ×3 IMPLANT

## 2016-11-26 NOTE — Discharge Instructions (Signed)
Eye Surgery Discharge Instructions  Expect mild scratchy sensation or mild soreness. DO NOT RUB YOUR EYE!  The day of surgery:  Minimal physical activity, but bed rest is not required  No reading, computer work, or close hand work  No bending, lifting, or straining.  May watch TV  For 24 hours:  No driving, legal decisions, or alcoholic beverages  Safety precautions  Eat anything you prefer: It is better to start with liquids, then soup then solid foods.  _____ Eye patch should be worn until postoperative exam tomorrow.  ____ Solar shield eyeglasses should be worn for comfort in the sunlight/patch while sleeping  Resume all regular medications including aspirin or Coumadin if these were discontinued prior to surgery. You may shower, bathe, shave, or wash your hair. Tylenol may be taken for mild discomfort.  Call your doctor if you experience significant pain, nausea, or vomiting, fever > 101 or other signs of infection. [phone] or [phone] Specific instructions:  Follow-up Information    PORFILIO,WILLIAM LOUIS, MD Follow up.   Specialty:  Ophthalmology Why:  11/26/16 at 1:40 Contact information: [address] [place] [address] [phone]          Eye Surgery Discharge Instructions  Expect mild scratchy sensation or mild soreness. DO NOT RUB YOUR EYE!  The day of surgery:  Minimal physical activity, but bed rest is not required  No reading, computer work, or close hand work  No bending, lifting, or straining.  May watch TV  For 24 hours:  No driving, legal decisions, or alcoholic beverages  Safety precautions  Eat anything you prefer: It is better to start with liquids, then soup then solid foods.  _____ Eye patch should be worn until postoperative exam tomorrow.  ____ Solar shield eyeglasses should be worn for comfort in the sunlight/patch while sleeping  Resume all regular medications including aspirin or Coumadin if these  were discontinued prior to surgery. You may shower, bathe, shave, or wash your hair. Tylenol may be taken for mild discomfort.  Call your doctor if you experience significant pain, nausea, or vomiting, fever > 101 or other signs of infection. [phone] or [phone] Specific instructions:  Follow-up Information    PORFILIO,WILLIAM LOUIS, MD Follow up.   Specialty:  Ophthalmology Why:  11/26/16 at 1:40 Contact information: [place]  [id number] [phone]

## 2016-11-26 NOTE — Anesthesia Postprocedure Evaluation (Signed)
Anesthesia Post Note  Patient: Lindsey Shaw  Procedure(s) Performed: Procedure(s) (LRB): CATARACT EXTRACTION PHACO AND INTRAOCULAR LENS PLACEMENT (IOC) (Right)  Patient location during evaluation: PACU Anesthesia Type: MAC Level of consciousness: awake, awake and alert and oriented Pain management: pain level controlled Vital Signs Assessment: post-procedure vital signs reviewed and stable Respiratory status: spontaneous breathing Cardiovascular status: blood pressure returned to baseline and stable Postop Assessment: no signs of nausea or vomiting Anesthetic complications: no    Last Vitals:  Vitals:   11/26/16 0842 11/26/16 0946  BP: (!) 160/97 (!) 143/69  Pulse: (!) 58   Resp: 16 10  Temp: 36.4 C 36.2 C    Last Pain:  Vitals:   11/26/16 0946  TempSrc: Temporal  PainSc: 0-No pain                 Roger Kettles Lorenza Chick

## 2016-11-26 NOTE — Anesthesia Preprocedure Evaluation (Signed)
Anesthesia Evaluation  Patient identified by MRN, date of birth, ID band Patient awake    Reviewed: Allergy & Precautions, NPO status , Patient's Chart, lab work & pertinent test results, reviewed documented beta blocker date and time   History of Anesthesia Complications Negative for: history of anesthetic complications  Airway Mallampati: II  TM Distance: >3 FB     Dental  (+) Chipped   Pulmonary neg pulmonary ROS, neg sleep apnea, neg COPD,    breath sounds clear to auscultation- rhonchi (-) wheezing      Cardiovascular hypertension, Pt. on medications + Past MI (silent MI many years ago) and + Peripheral Vascular Disease   Rhythm:Regular Rate:Normal - Systolic murmurs and - Diastolic murmurs    Neuro/Psych PSYCHIATRIC DISORDERS Anxiety Depression negative neurological ROS     GI/Hepatic Neg liver ROS, GERD  ,  Endo/Other  diabetes, Type 2, Oral Hypoglycemic Agents  Renal/GU Renal InsufficiencyRenal disease     Musculoskeletal  (+) Arthritis ,   Abdominal (+) - obese,   Peds  Hematology negative hematology ROS (+)   Anesthesia Other Findings Past Medical History: No date: Anxiety No date: Arthritis No date: Blood clotting tendency ([place])     Comment: 2006   BLOOD CLOTT LEG TO GROIN No date: Cancer ([place])     Comment: MELANOMA  No date: Chronic kidney disease     Comment: Stage 3 Kidney Disease No date: Depression No date: Diabetes mellitus without complication ([place]) 7322: 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[place])     Comment: Left leg No date: GERD (gastroesophageal reflux disease) No date: Gout No date: Hyperlipidemia No date: Hypertension No date: Mental disorder No date: Myocardial infarction     Comment: 10+ YRS (Lexiscan: no evid of               ischemia/infarct, EF 72% 06/23/12 [facility])   Reproductive/Obstetrics                             Anesthesia Physical  Anesthesia  Plan  ASA: III  Anesthesia Plan: MAC   Post-op Pain Management:    Induction: Intravenous  Airway Management Planned: Nasal Cannula and Natural Airway  Additional Equipment:   Intra-op Plan:   Post-operative Plan:   Informed Consent: I have reviewed the patients History and Physical, chart, labs and discussed the procedure including the risks, benefits and alternatives for the proposed anesthesia with the patient or authorized representative who has indicated his/her understanding and acceptance.     Plan Discussed with: CRNA and Anesthesiologist  Anesthesia Plan Comments:         Anesthesia Quick Evaluation

## 2016-11-26 NOTE — Op Note (Signed)
PREOPERATIVE DIAGNOSIS:  Nuclear sclerotic cataract of the right eye.   POSTOPERATIVE DIAGNOSIS:  NUCLEAR SCLEROTIC CATARACT RIGHT EYE   OPERATIVE PROCEDURE: Procedure(s): CATARACT EXTRACTION PHACO AND INTRAOCULAR LENS PLACEMENT (IOC)   SURGEON:  Birder Robson, MD.   ANESTHESIA:  Anesthesiologist: Emmie Niemann, MD CRNA: Hedda Slade, CRNA  1.      Managed anesthesia care. 2.      0.59ml of Shugarcaine was instilled in the eye following the paracentesis.   COMPLICATIONS:  None.   TECHNIQUE:   Stop and chop   DESCRIPTION OF PROCEDURE:  The patient was examined and consented in the preoperative holding area where the aforementioned topical anesthesia was applied to the right eye and then brought back to the Operating Room where the right eye was prepped and draped in the usual sterile ophthalmic fashion and a lid speculum was placed. A paracentesis was created with the side port blade and the anterior chamber was filled with viscoelastic. A near clear corneal incision was performed with the steel keratome. A continuous curvilinear capsulorrhexis was performed with a cystotome followed by the capsulorrhexis forceps. Hydrodissection and hydrodelineation were carried out with BSS on a blunt cannula. The lens was removed in a stop and chop  technique and the remaining cortical material was removed with the irrigation-aspiration handpiece. The capsular bag was inflated with viscoelastic and the Technis [id number]  lens was placed in the capsular bag without complication. The remaining viscoelastic was removed from the eye with the irrigation-aspiration handpiece. The wounds were hydrated. The anterior chamber was flushed with Miostat and the eye was inflated to physiologic pressure. 0.35ml of Vigamox was placed in the anterior chamber. The wounds were found to be water tight. The eye was dressed with Vigamox. The patient was given protective glasses to wear throughout the day and a shield with which to  sleep tonight. The patient was also given drops with which to begin a drop regimen today and will follow-up with me in one day.  Implant Name Type Inv. Item Serial No. Manufacturer Lot No. LRB No. Used  LENS IOL DIOP 22.0 - [id number] Intraocular Lens LENS IOL DIOP 22.0 [id number] AMO   Right 1   Procedure(s) with comments: CATARACT EXTRACTION PHACO AND INTRAOCULAR LENS PLACEMENT (IOC) (Right) - [place] 48.1 AP% 15.4 CDE 7.36 Fluid pack lot # [id number]  Electronically signed: [place] 11/26/2016 9:44 AM

## 2016-11-26 NOTE — Anesthesia Procedure Notes (Signed)
Performed by: Hedda Slade Pre-anesthesia Checklist: Patient identified, Emergency Drugs available, Suction available and Patient being monitored Oxygen Delivery Method: Nasal cannula

## 2016-11-26 NOTE — H&P (Signed)
All labs reviewed. Abnormal studies sent to patients PCP when indicated.  Previous H&P reviewed, patient examined, there are NO CHANGES.  Lindsey Shaw LOUIS12/12/20179:17 AM

## 2016-11-26 NOTE — Transfer of Care (Signed)
Immediate Anesthesia Transfer of Care Note  Patient: Lindsey Shaw  Procedure(s) Performed: Procedure(s) with comments: CATARACT EXTRACTION PHACO AND INTRAOCULAR LENS PLACEMENT (IOC) (Right) - [place] 48.1 AP% 15.4 CDE 7.36 Fluid pack lot # [id number]  Patient Location: PACU  Anesthesia Type:MAC  Level of Consciousness: awake, alert  and oriented  Airway & Oxygen Therapy: Patient Spontanous Breathing  Post-op Assessment: Report given to RN and Post -op Vital signs reviewed and stable  Post vital signs: Reviewed and stable  Last Vitals:  Vitals:   11/26/16 0842 11/26/16 0946  BP: (!) 160/97 (!) 143/69  Pulse: (!) 58   Resp: 16 10  Temp: 36.4 C 36.2 C    Last Pain:  Vitals:   11/26/16 0946  TempSrc: Temporal  PainSc: 0-No pain         Complications: No apparent anesthesia complications

## 2016-11-27 ENCOUNTER — Encounter: Payer: Self-pay | Admitting: Ophthalmology

## 2017-01-02 ENCOUNTER — Ambulatory Visit: Payer: Medicare Other

## 2017-02-03 ENCOUNTER — Ambulatory Visit
Admission: RE | Admit: 2017-02-03 | Discharge: 2017-02-03 | Disposition: A | Discharge status: Home / Self Care | Payer: Medicare Other | Source: Ambulatory Visit | Attending: Internal Medicine | Admitting: Internal Medicine

## 2017-02-03 DIAGNOSIS — Z1231 Encounter for screening mammogram for malignant neoplasm of breast: Secondary | ICD-10-CM | POA: Diagnosis present

## 2017-02-21 ENCOUNTER — Telehealth: Payer: Self-pay | Admitting: Cardiovascular Disease

## 2017-02-21 NOTE — Telephone Encounter (Signed)
Her visit was in 2017 - it's been over a year since we've seen her, so she will need eval before we can clear her.

## 2017-02-21 NOTE — Telephone Encounter (Signed)
Patient was seen in 02/18 and schedule an appt for surgical clearance for 03/04/17 knee replacement.   Does patient need to come in for card clearance ?

## 2017-02-24 ENCOUNTER — Other Ambulatory Visit: Payer: Self-pay | Admitting: Orthopedic Surgery

## 2017-02-24 DIAGNOSIS — M7121 Synovial cyst of popliteal space [Baker], right knee: Secondary | ICD-10-CM

## 2017-02-26 ENCOUNTER — Telehealth: Payer: Self-pay | Admitting: Cardiovascular Disease

## 2017-02-26 NOTE — Telephone Encounter (Signed)
Received cardiac clearance request for pt to proceed w/ Rt TKR on 04/09/17 w/ Dr. Kathryne Hitch. Pt's last ov was 01/22/16; she is sched to see Dr. Rockey Situ for pre-op evaluation on 03/04/17. Please route clearance at that time to [facility] @ [phone], Attn: Claiborne Billings.

## 2017-02-27 ENCOUNTER — Ambulatory Visit
Admission: RE | Admit: 2017-02-27 | Discharge: 2017-02-27 | Disposition: A | Discharge status: Home / Self Care | Payer: Medicare Other | Source: Ambulatory Visit | Attending: Orthopedic Surgery | Admitting: Orthopedic Surgery

## 2017-02-27 ENCOUNTER — Other Ambulatory Visit: Payer: Self-pay | Admitting: Orthopedic Surgery

## 2017-02-27 DIAGNOSIS — M7121 Synovial cyst of popliteal space [Baker], right knee: Secondary | ICD-10-CM

## 2017-03-03 NOTE — Progress Notes (Signed)
Cardiology Office Note  Date:  03/04/2017   ID:  Lindsey Shaw, Lindsey Shaw Apr 22, 1941, MRN [id number]  PCP:  Kirk Ruths., MD   Chief Complaint  Patient presents with  . other     Pre op clearance for right knee replacement Dr. Percell Miller in [place]. Meds reviewed verbally with patient.     HPI:  Lindsey Shaw is a very pleasant 76 year old woman with history of diabetes, hyperlipidemia, Depression,Chronic kidney disease, back pain November 2016 requiring vertebroplasty, abnormal EKG, presenting for preoperative evaluation for right knee replacement Dr. Percell Miller in [place]  In follow-up she reports that she is doing well Very active at baseline but limited from her right knee pain Used a walker regular basis with no symptoms concerning for angina She still stays very active but limited by her arthritic issue  denies anyChest pain or shortness of breath  Last stress test 2013 showing no ischemia  EKG on today's visit shows sinus bradycardia rate 56 bpm no significant ST or T-wave changes Unchanged from previous EKGs  Other past medical history reviewed Previously seen by Dr. Clayborn Bigness Several years agofor preoperative evaluation prior to back surgery. She was cleared for surgery the recommendation was made to have stress test  following the surgery based on abnormal EKG  surgery went uneventfully. Stress test 2013: normal  Reports having severe chest pain back in 2007, stress test at that time, was told this was normal  Previous episodes of sweating   Lab work reviewed with her showing hemoglobin A1c 7.5. She is trying to watch her diet Total cholesterol 143. Tolerating her statin   PMH:   has a past medical history of Anxiety; Arthritis; Blood clotting tendency ([place]); Cancer ([place]); Chronic kidney disease; Depression; Diabetes mellitus without complication ([place]); 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[place]) (2006); GERD (gastroesophageal reflux disease); Gout; Hyperlipidemia;  Hypertension;   PSH:    Past Surgical History:  Procedure Laterality Date  . BACK SURGERY     2007  . CATARACT EXTRACTION W/PHACO Left 11/05/2016   Procedure: CATARACT EXTRACTION PHACO AND INTRAOCULAR LENS PLACEMENT (IOC);  Surgeon: Birder Robson, MD;  Location: [facility] ORS;  Service: Ophthalmology;  Laterality: Left;  Lot # [id number] H [place]: 01:16.4 AP%:24.0 CDE: 18.29  . CATARACT EXTRACTION W/PHACO Right 11/26/2016   Procedure: CATARACT EXTRACTION PHACO AND INTRAOCULAR LENS PLACEMENT (IOC);  Surgeon: Birder Robson, MD;  Location: [facility] ORS;  Service: Ophthalmology;  Laterality: Right;  [place] 48.1 AP% 15.4 CDE 7.36 Fluid pack lot # [id number]  . CHOLECYSTECTOMY    . KYPHOPLASTY N/A 11/21/2015   Procedure: KYPHOPLASTY THORACIC 12;  Surgeon: Hessie Knows, MD;  Location: [facility] ORS;  Service: Orthopedics;  Laterality: N/A;  . LUMBAR LAMINECTOMY/DECOMPRESSION MICRODISCECTOMY Right 06/28/2013   Procedure: LUMBAR LAMINECTOMY/DECOMPRESSION MICRODISCECTOMY 1 LEVEL;  Surgeon: Elaina Hoops, MD;  Location: [place] [facility];  Service: Neurosurgery;  Laterality: Right;  Lumbar Laminectomy Decompression Lumbar Four-Five Right  . TOE SURGERY     BIL GREAT TOE JOINT REMOVED  . TUBAL LIGATION     73    Current Outpatient Prescriptions  Medication Sig Dispense Refill  . aspirin EC 81 MG tablet Take 81 mg by mouth at bedtime.     Marland Kitchen atorvastatin (LIPITOR) 40 MG tablet Take 40 mg by mouth every evening.    . citalopram (CELEXA) 20 MG tablet Take 20 mg by mouth every evening.    . colchicine 0.6 MG tablet Take 0.6 mg by mouth daily as needed (gout).    Marland Kitchen diclofenac sodium (VOLTAREN) 1 %  GEL Apply 1 application topically 3 (three) times daily as needed (pain).     . DUREZOL 0.05 % EMUL Place 1 drop into the left eye 2 (two) times daily.     Marland Kitchen ibuprofen (ADVIL,MOTRIN) 200 MG tablet Take 400 mg by mouth 2 (two) times daily as needed for moderate pain.    Marland Kitchen losartan-hydrochlorothiazide (HYZAAR) 100-25 MG per tablet Take  1 tablet by mouth at bedtime.     . metFORMIN (GLUCOPHAGE-XR) 500 MG 24 hr tablet Take 500 mg by mouth every evening.    . nepafenac (ILEVRO) 0.3 % ophthalmic suspension Place 1 drop into the left eye daily.    Marland Kitchen omeprazole (PRILOSEC) 20 MG capsule Take 20 mg by mouth every evening.    Marland Kitchen oxyCODONE-acetaminophen (PERCOCET/ROXICET) 5-325 MG tablet Take 1 tablet by mouth daily as needed for pain.    Marland Kitchen ULORIC 80 MG TABS Take 80 mg by mouth daily.     No current facility-administered medications for this visit.    Facility-Administered Medications Ordered in Other Visits  Medication Dose Route Frequency Provider Last Rate Last Dose  . fentaNYL (SUBLIMAZE) injection 25 mcg  25 mcg Intravenous Q5 min PRN Alvin Critchley, MD      . ondansetron [facility]) injection 4 mg  4 mg Intravenous Once PRN Alvin Critchley, MD         Allergies:   Keflex [cephalexin]   Social History:  The patient  reports that she has never smoked. She has never used smokeless tobacco. She reports that she does not drink alcohol or use drugs.   Family History:   family history includes AAA (abdominal aortic aneurysm) in her paternal aunt; Breast cancer in her cousin, maternal aunt, and maternal uncle; Lung cancer in her father; Ovarian cancer in her mother; Pancreatic cancer in her mother.    Review of Systems: Review of Systems  Constitutional: Negative.   Respiratory: Negative.   Cardiovascular: Negative.   Gastrointestinal: Negative.   Musculoskeletal: Positive for joint pain.  Neurological: Negative.   Psychiatric/Behavioral: Negative.   All other systems reviewed and are negative.    PHYSICAL EXAM: VS:  BP 138/76 (BP Location: Left Arm, Patient Position: Sitting, Cuff Size: Normal)   Pulse (!) 56   Ht 5\' 5"  (1.651 m)   Wt 158 lb (71.7 kg)   BMI 26.29 kg/m  , BMI Body mass index is 26.29 kg/m. GEN: Well nourished, well developed, in no acute distress  HEENT: normal  Neck: no JVD, carotid bruits, or  masses Cardiac: RRR; no murmurs, rubs, or gallops,no edema  Respiratory:  clear to auscultation bilaterally, normal work of breathing GI: soft, nontender, nondistended, + BS MS: no deformity or atrophy  Skin: warm and dry, no rash Neuro:  Strength and sensation are intact Psych: euthymic mood, full affect    Recent Labs: No results found for requested labs within last 8760 hours.    Lipid Panel No results found for: CHOL, HDL, LDLCALC, TRIG    Wt Readings from Last 3 Encounters:  03/04/17 158 lb (71.7 kg)  11/26/16 160 lb (72.6 kg)  11/05/16 160 lb (72.6 kg)       ASSESSMENT AND PLAN:  Mixed hyperlipidemia Cholesterol is at goal on the current lipid regimen. No changes to the medications were made.  Essential hypertension - Plan: EKG 12-Lead Blood pressure is well controlled on today's visit. No changes made to the medications.  Abnormal EKG - Plan: EKG 12-Lead EKG is essentially normal on today's visit  Previous abnormal EKG he likely from lead placement We did discuss screening studies if she ever develops any symptoms concerning for angina ( CT coronary calcium scoring)  Uncontrolled type 2 diabetes mellitus without complication, without long-term current use of insulin ([place]) - Plan: EKG 12-Lead Discussed her diet with her, recommended she decrease her sweets/desserts Unable to exercise on a regular basis secondary to orthopedic issue  Preop cardiovascular exam - Plan: EKG 12-Lead Acceptable risk for surgery, no further testing needed Nonsmoker, excellent cholesterol, Mildly elevated hemoglobin A1c No anginal symptoms No known coronary disease, good exercise tolerance   Total encounter time more than 25 minutes  Greater than 50% was spent in counseling and coordination of care with the patient   Disposition:   F/U  12 months   Orders Placed This Encounter  Procedures  . EKG 12-Lead     Signed, Esmond Plants, M.D., Ph.D. 03/04/2017  [place], [place]

## 2017-03-04 ENCOUNTER — Encounter: Payer: Self-pay | Admitting: Cardiovascular Disease

## 2017-03-04 ENCOUNTER — Ambulatory Visit (INDEPENDENT_AMBULATORY_CARE_PROVIDER_SITE_OTHER): Payer: Medicare Other | Admitting: Cardiovascular Disease

## 2017-03-04 VITALS — BP 138/76 | HR 56 | Ht 65.0 in | Wt 158.0 lb

## 2017-03-04 DIAGNOSIS — E1165 Type 2 diabetes mellitus with hyperglycemia: Secondary | ICD-10-CM

## 2017-03-04 DIAGNOSIS — E782 Mixed hyperlipidemia: Secondary | ICD-10-CM | POA: Diagnosis not present

## 2017-03-04 DIAGNOSIS — IMO0001 Reserved for inherently not codable concepts without codable children: Secondary | ICD-10-CM

## 2017-03-04 DIAGNOSIS — I10 Essential (primary) hypertension: Secondary | ICD-10-CM | POA: Diagnosis not present

## 2017-03-04 DIAGNOSIS — Z01810 Encounter for preprocedural cardiovascular examination: Secondary | ICD-10-CM | POA: Diagnosis not present

## 2017-03-04 DIAGNOSIS — R9431 Abnormal electrocardiogram [ECG] [EKG]: Secondary | ICD-10-CM

## 2017-03-04 NOTE — Patient Instructions (Signed)

## 2017-03-06 NOTE — Telephone Encounter (Signed)
Preop cardiovascular exam  Acceptable risk for surgery, no further testing needed Nonsmoker, excellent cholesterol, Mildly elevated hemoglobin A1c No anginal symptoms No known coronary disease, good exercise tolerance

## 2017-03-25 NOTE — H&P (Signed)
TOTAL KNEE ADMISSION H&P  Patient is being admitted for right total knee arthroplasty.  Subjective:  Chief Complaint:right knee pain.  HPI: Lindsey Shaw, 76 y.o. female, has a history of pain and functional disability in the right knee due to arthritis and has failed non-surgical conservative treatments for greater than 12 weeks to includeNSAID's and/or analgesics, corticosteriod injections and viscosupplementation injections.  Onset of symptoms was gradual, starting 5 years ago with rapidlly worsening course since that time. The patient noted no past surgery on the right knee(s).  Patient currently rates pain in the right knee(s) at 10 out of 10 with activity. Patient has night pain, worsening of pain with activity and weight bearing, pain that interferes with activities of daily living, pain with passive range of motion, crepitus and joint swelling.  Patient has evidence of subchondral sclerosis and joint space narrowing by imaging studies. There is no active infection.  Patient Active Problem List   Diagnosis Date Noted  . Abnormal EKG 01/22/2016  . History of chest pain 01/22/2016  . Uncontrolled type 2 diabetes mellitus without complication, without long-term current use of insulin ([place]) 01/22/2016  . Hyperlipidemia 01/22/2016  . Essential hypertension 01/22/2016  . Intractable pain 10/21/2015   Past Medical History:  Diagnosis Date  . Anxiety   . Arthritis   . Blood clotting tendency ([place])    2006   BLOOD CLOTT LEG TO GROIN  . Cancer ([place])    MELANOMA   . Chronic kidney disease    Stage 3 Kidney Disease  . Depression   . Diabetes mellitus without complication ([place])   . 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[place]) 2006   Left leg  . GERD (gastroesophageal reflux disease)   . Gout   . Hyperlipidemia   . Hypertension   . Mental disorder   . Myocardial infarction    10+ YRS (Lexiscan: no evid of ischemia/infarct, EF 72% 06/23/12 [facility])    Past Surgical History:  Procedure  Laterality Date  . BACK SURGERY     2007  . CATARACT EXTRACTION W/PHACO Left 11/05/2016   Procedure: CATARACT EXTRACTION PHACO AND INTRAOCULAR LENS PLACEMENT (IOC);  Surgeon: Birder Robson, MD;  Location: [facility] ORS;  Service: Ophthalmology;  Laterality: Left;  Lot # [id number] H [place]: 01:16.4 AP%:24.0 CDE: 18.29  . CATARACT EXTRACTION W/PHACO Right 11/26/2016   Procedure: CATARACT EXTRACTION PHACO AND INTRAOCULAR LENS PLACEMENT (IOC);  Surgeon: Birder Robson, MD;  Location: [facility] ORS;  Service: Ophthalmology;  Laterality: Right;  [place] 48.1 AP% 15.4 CDE 7.36 Fluid pack lot # [id number]  . CHOLECYSTECTOMY    . KYPHOPLASTY N/A 11/21/2015   Procedure: KYPHOPLASTY THORACIC 12;  Surgeon: Hessie Knows, MD;  Location: [facility] ORS;  Service: Orthopedics;  Laterality: N/A;  . LUMBAR LAMINECTOMY/DECOMPRESSION MICRODISCECTOMY Right 06/28/2013   Procedure: LUMBAR LAMINECTOMY/DECOMPRESSION MICRODISCECTOMY 1 LEVEL;  Surgeon: Elaina Hoops, MD;  Location: [facility];  Service: Neurosurgery;  Laterality: Right;  Lumbar Laminectomy Decompression Lumbar Four-Five Right  . TOE SURGERY     BIL GREAT TOE JOINT REMOVED  . TUBAL LIGATION     73    No prescriptions prior to admission.   Allergies  Allergen Reactions  . Keflex [Cephalexin] Swelling    Social History  Substance Use Topics  . Smoking status: Never Smoker  . Smokeless tobacco: Never Used  . Alcohol use No    Family History  Problem Relation Age of Onset  . Ovarian cancer Mother   . Pancreatic cancer Mother   . Lung cancer  Father   . AAA (abdominal aortic aneurysm) Paternal Aunt   . Breast cancer Maternal Aunt     60's  . Breast cancer Maternal Uncle     60's  . Breast cancer Cousin     1 cousin-maternal side     Review of Systems  Constitutional: Negative.   HENT: Positive for hearing loss.   Eyes: Negative.   Respiratory: Negative.   Cardiovascular: Negative.   Gastrointestinal: Negative.   Genitourinary: Negative.    Musculoskeletal: Positive for joint pain.  Skin: Negative.   Neurological: Negative.   Endo/Heme/Allergies: Negative.   Psychiatric/Behavioral: Negative.     Objective:  Physical Exam  Constitutional: She is oriented to person, place, and time. She appears well-developed and well-nourished.  HENT:  Head: Normocephalic and atraumatic.  Eyes: EOM are normal. Pupils are equal, round, and reactive to light.  Neck: Normal range of motion. Neck supple.  Cardiovascular: Normal rate and regular rhythm.   Respiratory: Effort normal and breath sounds normal.  GI: Soft. Bowel sounds are normal.  Musculoskeletal:  Examination of her right knee reveals trace effusion. Range of motion 0-100 degrees. Medial and lateral joint line tenderness. Negative logroll. Negative straight leg raise. She is neurovascularly intact distally. Moderate-sized Baker's cyst.  Neurological: She is alert and oriented to person, place, and time.  Skin: Skin is warm and dry.  Psychiatric: She has a normal mood and affect. Her behavior is normal. Judgment and thought content normal.    Vital signs in last 24 hours:    Labs:   Estimated body mass index is 26.29 kg/m as calculated from the following:   Height as of 03/04/17: 5\' 5"  (1.651 m).   Weight as of 03/04/17: 71.7 kg (158 lb).   Imaging Review Plain radiographs demonstrate severe degenerative joint disease of the right knee(s). The overall alignment isneutral. The bone quality appears to be fair for age and reported activity level.  Assessment/Plan:  End stage arthritis, right knee   The patient history, physical examination, clinical judgment of the provider and imaging studies are consistent with end stage degenerative joint disease of the right knee(s) and total knee arthroplasty is deemed medically necessary. The treatment options including medical management, injection therapy arthroscopy and arthroplasty were discussed at length. The risks and  benefits of total knee arthroplasty were presented and reviewed. The risks due to aseptic loosening, infection, stiffness, patella tracking problems, thromboembolic complications and other imponderables were discussed. The patient acknowledged the explanation, agreed to proceed with the plan and consent was signed. Patient is being admitted for inpatient treatment for surgery, pain control, PT, OT, prophylactic antibiotics, VTE prophylaxis, progressive ambulation and ADL's and discharge planning. The patient is planning to be discharged home with home health services

## 2017-03-28 ENCOUNTER — Encounter (HOSPITAL_COMMUNITY): Payer: Self-pay

## 2017-03-28 ENCOUNTER — Encounter (HOSPITAL_COMMUNITY)
Admission: RE | Admit: 2017-03-28 | Discharge: 2017-03-28 | Disposition: A | Discharge status: Home / Self Care | Payer: Medicare Other | Source: Ambulatory Visit | Attending: Orthopedic Surgery | Admitting: Orthopedic Surgery

## 2017-03-28 DIAGNOSIS — M1711 Unilateral primary osteoarthritis, right knee: Secondary | ICD-10-CM | POA: Diagnosis not present

## 2017-03-28 DIAGNOSIS — Z01812 Encounter for preprocedural laboratory examination: Secondary | ICD-10-CM | POA: Insufficient documentation

## 2017-03-28 HISTORY — DX: Angina pectoris, unspecified: I20.9

## 2017-03-28 LAB — ABO/RH: ABO/RH(D): A POS

## 2017-03-28 LAB — CBC
HCT: 33.6 % — ABNORMAL LOW (ref 36.0–46.0)
Hemoglobin: 11.3 g/dL — ABNORMAL LOW (ref 12.0–15.0)
MCH: 30.2 pg (ref 26.0–34.0)
MCHC: 33.6 g/dL (ref 30.0–36.0)
MCV: 89.8 fL (ref 78.0–100.0)
PLATELETS: 216 10*3/uL (ref 150–400)
RBC: 3.74 MIL/uL — ABNORMAL LOW (ref 3.87–5.11)
RDW: 13 % (ref 11.5–15.5)
WBC: 4.6 10*3/uL (ref 4.0–10.5)

## 2017-03-28 LAB — TYPE AND SCREEN
ABO/RH(D): A POS
ANTIBODY SCREEN: NEGATIVE

## 2017-03-28 LAB — GLUCOSE, CAPILLARY: GLUCOSE-CAPILLARY: 108 mg/dL — AB (ref 65–99)

## 2017-03-28 LAB — BASIC METABOLIC PANEL
ANION GAP: 9 (ref 5–15)
BUN: 17 mg/dL (ref 6–20)
CHLORIDE: 101 mmol/L (ref 101–111)
CO2: 25 mmol/L (ref 22–32)
CREATININE: 1.29 mg/dL — AB (ref 0.44–1.00)
Calcium: 9.7 mg/dL (ref 8.9–10.3)
GFR calc non Af Amer: 40 mL/min — ABNORMAL LOW (ref >60)
GFR, EST AFRICAN AMERICAN: 46 mL/min — AB (ref >60)
GLUCOSE: 117 mg/dL — AB (ref 65–99)
Potassium: 3.9 mmol/L (ref 3.5–5.1)
SODIUM: 135 mmol/L (ref 135–145)

## 2017-03-28 LAB — SURGICAL PCR SCREEN
MRSA, PCR: POSITIVE — AB
STAPHYLOCOCCUS AUREUS: POSITIVE — AB

## 2017-03-28 NOTE — Pre-Procedure Instructions (Addendum)
Delainee Tramel  03/28/2017      [id number] - [place], [place] - [address] [place] [address] Phone: [phone] Fax: [phone]  [place], [place] [facility] [address] [place] [address] [place] [address] Phone: [phone] Fax: [phone]    Your procedure is scheduled on 04/09/17.  Report to [facility] Admitting at 1000 A.M.  Call this number if you have problems the morning of surgery:  [phone]   Remember:  Do not eat food or drink liquids after midnight.  Take these medicines the morning of surgery with A SIP OF WATER     citalapram(celexa), omeprazole(protonix),oxycodone if needed  STOP all herbel meds, nsaids (aleve,naproxen,advil,ibuprofen)7 days prior to surgery starting 04/02/17 including all vitamins/supplements.aspirin, diclofenac(voltaren gel)     How to Manage Your Diabetes Before and After Surgery  Why is it important to control my blood sugar before and after surgery? . Improving blood sugar levels before and after surgery helps healing and can limit problems. . A way of improving blood sugar control is eating a healthy diet by: o  Eating less sugar and carbohydrates o  Increasing activity/exercise o  Talking with your doctor about reaching your blood sugar goals . High blood sugars (greater than 180 mg/dL) can raise your risk of infections and slow your recovery, so you will need to focus on controlling your diabetes during the weeks before surgery. . Make sure that the doctor who takes care of your diabetes knows about your planned surgery including the date and location.  How do I manage my blood sugar before surgery? . Check your blood sugar at least 4 times a day, starting 2 days before surgery, to make sure that the level is not too high or low. o Check your blood sugar the morning of your surgery when you wake up and every 2 hours until you get to the Short Stay  unit. . If your blood sugar is less than 70 mg/dL, you will need to treat for low blood sugar: o Do not take insulin. o Treat a low blood sugar (less than 70 mg/dL) with  cup of clear juice (cranberry or apple), 4 glucose tablets, OR glucose gel. o Recheck blood sugar in 15 minutes after treatment (to make sure it is greater than 70 mg/dL). If your blood sugar is not greater than 70 mg/dL on recheck, call [phone] for further instructions. . Report your blood sugar to the short stay nurse when you get to Short Stay.  . If you are admitted to the hospital after surgery: o Your blood sugar will be checked by the staff and you will probably be given insulin after surgery (instead of oral diabetes medicines) to make sure you have good blood sugar levels. o The goal for blood sugar control after surgery is 80-180 mg/dL.    WHAT DO I DO ABOUT MY DIABETES MEDICATION?   Marland Kitchen Do not take oral diabetes medicines (pills) the morning of surgery.(metformin)    Do not wear jewelry, make-up or nail polish.  Do not wear lotions, powders, or perfumes, or deoderant.  Do not shave 48 hours prior to surgery.  Men may shave face and neck.  Do not bring valuables to the hospital.  [facility] is not responsible for any belongings or valuables.  Contacts, dentures or bridgework may not be worn into surgery.  Leave your suitcase in the car.  After  surgery it may be brought to your room.  For patients admitted to the hospital, discharge time will be determined by your treatment team.  Patients discharged the day of surgery will not be allowed to drive home.   Special instructions:  Special Instructions: [place] - Preparing for Surgery  Before surgery, you can play an important role.  Because skin is not sterile, your skin needs to be as free of germs as possible.  You can reduce the number of germs on you skin by washing with CHG (chlorahexidine gluconate) soap before surgery.  CHG is an antiseptic  cleaner which kills germs and bonds with the skin to continue killing germs even after washing.  Please DO NOT use if you have an allergy to CHG or antibacterial soaps.  If your skin becomes reddened/irritated stop using the CHG and inform your nurse when you arrive at Short Stay.  Do not shave (including legs and underarms) for at least 48 hours prior to the first CHG shower.  You may shave your face.  Please follow these instructions carefully:   1.  Shower with CHG Soap the night before surgery and the morning of Surgery.  2.  If you choose to wash your hair, wash your hair first as usual with your normal shampoo.  3.  After you shampoo, rinse your hair and body thoroughly to remove the Shampoo.  4.  Use CHG as you would any other liquid soap.  You can apply chg directly  to the skin and wash gently with scrungie or a clean washcloth.  5.  Apply the CHG Soap to your body ONLY FROM THE NECK DOWN.  Do not use on open wounds or open sores.  Avoid contact with your eyes ears, mouth and genitals (private parts).  Wash genitals (private parts)       with your normal soap.  6.  Wash thoroughly, paying special attention to the area where your surgery will be performed.  7.  Thoroughly rinse your body with warm water from the neck down.  8.  DO NOT shower/wash with your normal soap after using and rinsing off the CHG Soap.  9.  Pat yourself dry with a clean towel.            10.  Wear clean pajamas.            11.  Place clean sheets on your bed the night of your first shower and do not sleep with pets.  Day of Surgery  Do not apply any lotions/deodorants the morning of surgery.  Please wear clean clothes to the hospital/surgery center.  Please read over the  fact sheets that you were given.

## 2017-03-29 LAB — HEMOGLOBIN A1C
HEMOGLOBIN A1C: 7 % — AB (ref 4.8–5.6)
Mean Plasma Glucose: 154 mg/dL

## 2017-04-02 ENCOUNTER — Encounter (HOSPITAL_COMMUNITY): Payer: Self-pay

## 2017-04-02 NOTE — Progress Notes (Signed)
Anesthesia Chart Review: Patient is a 76 year old female scheduled for right TKR on 04/09/17 by Dr. Kathryne Hitch.   History includes nonsmoker, HTN, question of MI > 10 years ago (although not listed in her PCP history and no evidence of infarction on Lexiscan '13; in discussion with patient it sounds like she showed possible prior MI on EKG and was referred for stress testing that was normal; she denied history of cardiac cath), diabetes mellitus type 2, LLE DVT '06, GERD, depression, anxiety, CKD stage III, HLD, melanoma (left arm), cholecystectomy, prior back surgery '07, right L4-5 laminectomy 06/28/13, appendectomy '67, T12 kyphoplasty.   PCP is Dr. Frazier Richards at [facility] last visit 03/13/17.  Cardiologist is Dr. Ida Rogue, last visit 03/04/17 for pre-operative evaluation. He wrote, "Preop cardiovascular exam - Plan: EKG 12-Lead Acceptable risk for surgery, no further testing needed Nonsmoker, excellent cholesterol, Mildly elevated hemoglobin A1c No anginal symptoms No known coronary disease, good exercise tolerance"  Meds include ASA 81 mg, Lipitor, Celexa, colchicine, Hyzaar, metformin, Prilosec, Percocet, Uloric.  Pulse 64   Temp 36.9 C   Resp 20   Ht 5\' 5"  (1.651 m)   Wt 156 lb (70.8 kg)   BMI 25.96 kg/m  BP was not recorded at PAT. BP was 131/72 at her last PCP visit.  I called and spoke with patient because PAT RN had documented that patient had chest pain history. (I was not asked to evaluate her during her PAT visit.) Patient reported that for years she will get mid chest pain "twinges" that last only a couple of seconds when she gets emotionally upset or anxious. There is no associated nausea, diaphoresis, jaw/arm pain, or SOB. Denied palpitations, syncope, edema. She does not have any exertional chest pain. She was able to walk from the parking deck to PAT without CV symptoms. She does all her own house cleaning including vacuuming without  chest pain.    EKG 03/04/17: SB at 56 bpm, LAD, low voltage QRS.   Nuclear stress test 06/23/12: IMPRESSION:  1.      Negative Lexiscan infusion.  2.      LV function normal.  3.      No evidence of infarction or ischemia.   Preoperative labs noted. BUN 17, Cr 1.29 (stable when compared to 11/2015). Glucose 117. H/H 11.3/33.6. PLT 216. A1c 7.0. T&S done. UA trace leukocytes, negative nitrites.  Patient with recent cardiology evaluation and preoperative risk assessment. Patient denied any new symptoms. No exertional chest pain. Reviewed above with anesthesiologist Dr. Deatra Canter. If no acute changes then it is anticipated that she can proceed as planned.  George Hugh [facility] Short Stay Center/Anesthesiology Phone [phone] 04/02/2017 5:00 PM

## 2017-04-03 ENCOUNTER — Other Ambulatory Visit: Payer: Self-pay | Admitting: Orthopedic Surgery

## 2017-04-08 MED ORDER — VANCOMYCIN HCL IN DEXTROSE 1-5 GM/200ML-% IV SOLN
1000.0000 mg | INTRAVENOUS | Status: AC
  Administered 2017-04-09: 1000 mg via INTRAVENOUS
  Filled 2017-04-08 (×2): qty 200

## 2017-04-08 MED ORDER — LACTATED RINGERS IV SOLN: INTRAVENOUS | Status: DC

## 2017-04-09 ENCOUNTER — Inpatient Hospital Stay (HOSPITAL_COMMUNITY): Payer: Medicare Other

## 2017-04-09 ENCOUNTER — Inpatient Hospital Stay (HOSPITAL_COMMUNITY): Payer: Medicare Other | Admitting: Certified Registered Nurse Anesthetist

## 2017-04-09 ENCOUNTER — Inpatient Hospital Stay (HOSPITAL_COMMUNITY)
Admission: RE | Admit: 2017-04-09 | Discharge: 2017-04-11 | DRG: 470 | Disposition: A | Discharge status: Home Health | Payer: Medicare Other | Source: Ambulatory Visit | Attending: Orthopedic Surgery | Admitting: Orthopedic Surgery

## 2017-04-09 ENCOUNTER — Inpatient Hospital Stay (HOSPITAL_COMMUNITY): Payer: Medicare Other | Admitting: Vascular Surgery

## 2017-04-09 ENCOUNTER — Encounter (HOSPITAL_COMMUNITY)
Admission: RE | Disposition: A | Discharge status: Home Health | Payer: Self-pay | Source: Ambulatory Visit | Attending: Orthopedic Surgery

## 2017-04-09 DIAGNOSIS — I10 Essential (primary) hypertension: Secondary | ICD-10-CM | POA: Diagnosis present

## 2017-04-09 DIAGNOSIS — Z86718 Personal history of other venous thrombosis and embolism: Secondary | ICD-10-CM | POA: Diagnosis not present

## 2017-04-09 DIAGNOSIS — D62 Acute posthemorrhagic anemia: Secondary | ICD-10-CM | POA: Diagnosis not present

## 2017-04-09 DIAGNOSIS — I129 Hypertensive chronic kidney disease with stage 1 through stage 4 chronic kidney disease, or unspecified chronic kidney disease: Secondary | ICD-10-CM | POA: Diagnosis present

## 2017-04-09 DIAGNOSIS — I252 Old myocardial infarction: Secondary | ICD-10-CM | POA: Diagnosis not present

## 2017-04-09 DIAGNOSIS — E1165 Type 2 diabetes mellitus with hyperglycemia: Secondary | ICD-10-CM | POA: Diagnosis not present

## 2017-04-09 DIAGNOSIS — Z881 Allergy status to other antibiotic agents status: Secondary | ICD-10-CM | POA: Diagnosis not present

## 2017-04-09 DIAGNOSIS — E1122 Type 2 diabetes mellitus with diabetic chronic kidney disease: Secondary | ICD-10-CM | POA: Diagnosis present

## 2017-04-09 DIAGNOSIS — IMO0001 Reserved for inherently not codable concepts without codable children: Secondary | ICD-10-CM | POA: Diagnosis present

## 2017-04-09 DIAGNOSIS — N183 Chronic kidney disease, stage 3 (moderate): Secondary | ICD-10-CM | POA: Diagnosis present

## 2017-04-09 DIAGNOSIS — M1711 Unilateral primary osteoarthritis, right knee: Secondary | ICD-10-CM | POA: Diagnosis present

## 2017-04-09 DIAGNOSIS — R9431 Abnormal electrocardiogram [ECG] [EKG]: Secondary | ICD-10-CM | POA: Diagnosis present

## 2017-04-09 DIAGNOSIS — Z96659 Presence of unspecified artificial knee joint: Secondary | ICD-10-CM

## 2017-04-09 HISTORY — PX: TOTAL KNEE ARTHROPLASTY: SHX125

## 2017-04-09 LAB — GLUCOSE, CAPILLARY
GLUCOSE-CAPILLARY: 133 mg/dL — AB (ref 65–99)
GLUCOSE-CAPILLARY: 111 mg/dL — AB (ref 65–99)
GLUCOSE-CAPILLARY: 183 mg/dL — AB (ref 65–99)
Glucose-Capillary: 161 mg/dL — ABNORMAL HIGH (ref 65–99)

## 2017-04-09 SURGERY — ARTHROPLASTY, KNEE, TOTAL
Anesthesia: Spinal | Site: Knee | Laterality: Right

## 2017-04-09 MED ORDER — METFORMIN HCL ER 500 MG PO TB24
500.0000 mg | ORAL_TABLET | Freq: Every day | ORAL | Status: DC
  Administered 2017-04-09 – 2017-04-10 (×2): 500 mg via ORAL
  Filled 2017-04-09 (×2): qty 1

## 2017-04-09 MED ORDER — METOCLOPRAMIDE HCL 5 MG PO TABS: 5.0000 mg | ORAL_TABLET | Freq: Three times a day (TID) | ORAL | Status: DC | PRN

## 2017-04-09 MED ORDER — CHLORHEXIDINE GLUCONATE 4 % EX LIQD: 60.0000 mL | Freq: Once | CUTANEOUS | Status: DC

## 2017-04-09 MED ORDER — PROPOFOL 10 MG/ML IV BOLUS
INTRAVENOUS | Status: DC | PRN
  Administered 2017-04-09: 10 mg via INTRAVENOUS

## 2017-04-09 MED ORDER — CELECOXIB 200 MG PO CAPS
200.0000 mg | ORAL_CAPSULE | Freq: Two times a day (BID) | ORAL | Status: DC
  Administered 2017-04-09 – 2017-04-11 (×4): 200 mg via ORAL
  Filled 2017-04-09 (×4): qty 1

## 2017-04-09 MED ORDER — DIPHENHYDRAMINE HCL 12.5 MG/5ML PO ELIX
12.5000 mg | ORAL_SOLUTION | ORAL | Status: DC | PRN
  Administered 2017-04-11: 25 mg via ORAL
  Administered 2017-04-11: 12.5 mg via ORAL
  Filled 2017-04-09 (×2): qty 10

## 2017-04-09 MED ORDER — ATORVASTATIN CALCIUM 40 MG PO TABS
40.0000 mg | ORAL_TABLET | Freq: Every evening | ORAL | Status: DC
  Administered 2017-04-09 – 2017-04-10 (×2): 40 mg via ORAL
  Filled 2017-04-09 (×2): qty 1

## 2017-04-09 MED ORDER — PANTOPRAZOLE SODIUM 40 MG PO TBEC
40.0000 mg | DELAYED_RELEASE_TABLET | Freq: Every day | ORAL | Status: DC
  Administered 2017-04-10 – 2017-04-11 (×3): 40 mg via ORAL
  Filled 2017-04-09 (×2): qty 1

## 2017-04-09 MED ORDER — ONDANSETRON HCL 4 MG PO TABS: 4.0000 mg | ORAL_TABLET | Freq: Three times a day (TID) | ORAL | 0 refills | Status: DC | PRN

## 2017-04-09 MED ORDER — BUPIVACAINE HCL 0.5 % IJ SOLN
INTRAMUSCULAR | Status: DC | PRN
  Administered 2017-04-09: 20 mL

## 2017-04-09 MED ORDER — ACETAMINOPHEN 325 MG PO TABS
650.0000 mg | ORAL_TABLET | Freq: Four times a day (QID) | ORAL | Status: DC | PRN
  Administered 2017-04-10: 650 mg via ORAL
  Filled 2017-04-09: qty 2

## 2017-04-09 MED ORDER — 0.9 % SODIUM CHLORIDE (POUR BTL) OPTIME
TOPICAL | Status: DC | PRN
  Administered 2017-04-09: 1000 mL

## 2017-04-09 MED ORDER — FENTANYL CITRATE (PF) 250 MCG/5ML IJ SOLN
INTRAMUSCULAR | Status: AC
  Filled 2017-04-09: qty 5

## 2017-04-09 MED ORDER — METOCLOPRAMIDE HCL 5 MG/ML IJ SOLN: 5.0000 mg | Freq: Three times a day (TID) | INTRAMUSCULAR | Status: DC | PRN

## 2017-04-09 MED ORDER — MENTHOL 3 MG MT LOZG: 1.0000 | LOZENGE | OROMUCOSAL | Status: DC | PRN

## 2017-04-09 MED ORDER — INSULIN ASPART 100 UNIT/ML ~~LOC~~ SOLN
0.0000 [IU] | Freq: Three times a day (TID) | SUBCUTANEOUS | Status: DC
  Administered 2017-04-10 – 2017-04-11 (×4): 2 [IU] via SUBCUTANEOUS

## 2017-04-09 MED ORDER — BISACODYL 5 MG PO TBEC: 5.0000 mg | DELAYED_RELEASE_TABLET | Freq: Every day | ORAL | Status: DC | PRN

## 2017-04-09 MED ORDER — VANCOMYCIN HCL IN DEXTROSE 1-5 GM/200ML-% IV SOLN
1000.0000 mg | Freq: Two times a day (BID) | INTRAVENOUS | Status: AC
  Administered 2017-04-09: 1000 mg via INTRAVENOUS
  Filled 2017-04-09: qty 200

## 2017-04-09 MED ORDER — BUPIVACAINE LIPOSOME 1.3 % IJ SUSP
20.0000 mL | INTRAMUSCULAR | Status: AC
  Administered 2017-04-09: 20 mL
  Filled 2017-04-09: qty 20

## 2017-04-09 MED ORDER — LACTATED RINGERS IV SOLN
INTRAVENOUS | Status: DC | PRN
  Administered 2017-04-09 (×2): via INTRAVENOUS

## 2017-04-09 MED ORDER — HYDROCHLOROTHIAZIDE 25 MG PO TABS
25.0000 mg | ORAL_TABLET | Freq: Every day | ORAL | Status: DC
  Filled 2017-04-09: qty 1

## 2017-04-09 MED ORDER — HYDROMORPHONE HCL 1 MG/ML IJ SOLN
0.5000 mg | INTRAMUSCULAR | Status: DC | PRN
  Administered 2017-04-09 – 2017-04-10 (×2): 1 mg via INTRAVENOUS
  Filled 2017-04-09 (×2): qty 1

## 2017-04-09 MED ORDER — BUPIVACAINE HCL (PF) 0.5 % IJ SOLN
INTRAMUSCULAR | Status: AC
  Filled 2017-04-09: qty 30

## 2017-04-09 MED ORDER — FENTANYL CITRATE (PF) 100 MCG/2ML IJ SOLN
INTRAMUSCULAR | Status: AC
  Administered 2017-04-09: 50 ug
  Filled 2017-04-09: qty 2

## 2017-04-09 MED ORDER — PHENOL 1.4 % MT LIQD: 1.0000 | OROMUCOSAL | Status: DC | PRN

## 2017-04-09 MED ORDER — DIAZEPAM 2 MG PO TABS
2.0000 mg | ORAL_TABLET | Freq: Three times a day (TID) | ORAL | Status: DC | PRN
Start: 2017-04-09 — End: 2017-04-11

## 2017-04-09 MED ORDER — OXYCODONE HCL 5 MG PO TABS
5.0000 mg | ORAL_TABLET | ORAL | Status: DC | PRN
  Administered 2017-04-09 – 2017-04-10 (×3): 10 mg via ORAL
  Administered 2017-04-10: 5 mg via ORAL
  Administered 2017-04-11 (×2): 10 mg via ORAL
  Filled 2017-04-09 (×4): qty 2
  Filled 2017-04-09: qty 1
  Filled 2017-04-09: qty 2

## 2017-04-09 MED ORDER — ZOLPIDEM TARTRATE 5 MG PO TABS: 5.0000 mg | ORAL_TABLET | Freq: Every evening | ORAL | Status: DC | PRN

## 2017-04-09 MED ORDER — LOSARTAN POTASSIUM 50 MG PO TABS
100.0000 mg | ORAL_TABLET | Freq: Every day | ORAL | Status: DC
  Administered 2017-04-09: 100 mg via ORAL
  Filled 2017-04-09: qty 2

## 2017-04-09 MED ORDER — CITALOPRAM HYDROBROMIDE 20 MG PO TABS
20.0000 mg | ORAL_TABLET | Freq: Every evening | ORAL | Status: DC
  Administered 2017-04-10: 20 mg via ORAL
  Filled 2017-04-09: qty 1

## 2017-04-09 MED ORDER — ONDANSETRON HCL 4 MG PO TABS
4.0000 mg | ORAL_TABLET | Freq: Four times a day (QID) | ORAL | Status: DC | PRN
  Administered 2017-04-09: 4 mg via ORAL
  Filled 2017-04-09: qty 1

## 2017-04-09 MED ORDER — PHENYLEPHRINE HCL 10 MG/ML IJ SOLN
INTRAVENOUS | Status: DC | PRN
  Administered 2017-04-09: 50 ug/min via INTRAVENOUS

## 2017-04-09 MED ORDER — SODIUM CHLORIDE 0.9 % IJ SOLN
INTRAMUSCULAR | Status: DC | PRN
  Administered 2017-04-09: 40 mL via INTRAVENOUS

## 2017-04-09 MED ORDER — POTASSIUM CHLORIDE IN NACL 20-0.9 MEQ/L-% IV SOLN
INTRAVENOUS | Status: DC
  Administered 2017-04-09 – 2017-04-10 (×2): via INTRAVENOUS
  Filled 2017-04-09 (×2): qty 1000

## 2017-04-09 MED ORDER — ACETAMINOPHEN 650 MG RE SUPP: 650.0000 mg | Freq: Four times a day (QID) | RECTAL | Status: DC | PRN

## 2017-04-09 MED ORDER — POLYETHYLENE GLYCOL 3350 17 G PO PACK: 17.0000 g | PACK | Freq: Every day | ORAL | Status: DC | PRN

## 2017-04-09 MED ORDER — SODIUM CHLORIDE 0.9 % IR SOLN
Status: DC | PRN
  Administered 2017-04-09: 3000 mL

## 2017-04-09 MED ORDER — DOCUSATE SODIUM 100 MG PO CAPS
100.0000 mg | ORAL_CAPSULE | Freq: Two times a day (BID) | ORAL | Status: DC
  Administered 2017-04-09 – 2017-04-11 (×5): 100 mg via ORAL
  Filled 2017-04-09 (×5): qty 1

## 2017-04-09 MED ORDER — PROPOFOL 500 MG/50ML IV EMUL
INTRAVENOUS | Status: DC | PRN
  Administered 2017-04-09: 50 ug/kg/min via INTRAVENOUS

## 2017-04-09 MED ORDER — APIXABAN 2.5 MG PO TABS
2.5000 mg | ORAL_TABLET | Freq: Two times a day (BID) | ORAL | Status: DC
  Administered 2017-04-10 – 2017-04-11 (×3): 2.5 mg via ORAL
  Filled 2017-04-09 (×3): qty 1

## 2017-04-09 MED ORDER — MAGNESIUM CITRATE PO SOLN: 1.0000 | Freq: Once | ORAL | Status: DC | PRN

## 2017-04-09 MED ORDER — MIDAZOLAM HCL 2 MG/2ML IJ SOLN
INTRAMUSCULAR | Status: AC
  Administered 2017-04-09: 1 mg
  Filled 2017-04-09: qty 2

## 2017-04-09 MED ORDER — LACTATED RINGERS IV SOLN
INTRAVENOUS | Status: DC
  Administered 2017-04-09: 09:00:00 via INTRAVENOUS

## 2017-04-09 MED ORDER — METHOCARBAMOL 1000 MG/10ML IJ SOLN: 500.0000 mg | Freq: Four times a day (QID) | INTRAVENOUS | Status: DC | PRN

## 2017-04-09 MED ORDER — LOSARTAN POTASSIUM-HCTZ 100-25 MG PO TABS: 1.0000 | ORAL_TABLET | Freq: Every day | ORAL | Status: DC

## 2017-04-09 MED ORDER — METHOCARBAMOL 500 MG PO TABS
500.0000 mg | ORAL_TABLET | Freq: Four times a day (QID) | ORAL | Status: DC | PRN
  Administered 2017-04-10 – 2017-04-11 (×3): 500 mg via ORAL
  Filled 2017-04-09 (×3): qty 1

## 2017-04-09 MED ORDER — APIXABAN 2.5 MG PO TABS: ORAL_TABLET | ORAL | 0 refills | Status: DC

## 2017-04-09 MED ORDER — OXYCODONE-ACETAMINOPHEN 5-325 MG PO TABS: 1.0000 | ORAL_TABLET | ORAL | 0 refills | Status: DC | PRN

## 2017-04-09 MED ORDER — ONDANSETRON HCL 4 MG/2ML IJ SOLN
4.0000 mg | Freq: Four times a day (QID) | INTRAMUSCULAR | Status: DC | PRN
  Administered 2017-04-10: 4 mg via INTRAVENOUS
  Filled 2017-04-09: qty 2

## 2017-04-09 SURGICAL SUPPLY — 62 items
BANDAGE ACE 4X5 VEL STRL LF (GAUZE/BANDAGES/DRESSINGS) ×2 IMPLANT
BANDAGE ACE 6X5 VEL STRL LF (GAUZE/BANDAGES/DRESSINGS) ×2 IMPLANT
BANDAGE ESMARK 6X9 LF (GAUZE/BANDAGES/DRESSINGS) ×1 IMPLANT
BENZOIN TINCTURE PRP APPL 2/3 (GAUZE/BANDAGES/DRESSINGS) ×2 IMPLANT
BLADE SAG 18X100X1.27 (BLADE) ×4 IMPLANT
BNDG ESMARK 6X9 LF (GAUZE/BANDAGES/DRESSINGS) ×2
BOWL SMART MIX CTS (DISPOSABLE) ×2 IMPLANT
CAPT KNEE TOTAL 3 ×2 IMPLANT
CEMENT BONE SIMPLEX SPEEDSET (Cement) ×4 IMPLANT
COVER SURGICAL LIGHT HANDLE (MISCELLANEOUS) ×2 IMPLANT
CUFF TOURNIQUET SINGLE 34IN LL (TOURNIQUET CUFF) ×2 IMPLANT
DRAPE EXTREMITY T 121X128X90 (DRAPE) ×2 IMPLANT
DRAPE HALF SHEET 40X57 (DRAPES) ×4 IMPLANT
DRAPE IMP U-DRAPE 54X76 (DRAPES) ×2 IMPLANT
DRAPE U-SHAPE 47X51 STRL (DRAPES) ×2 IMPLANT
DRESSING AQUACEL AG SP 3.5X10 (GAUZE/BANDAGES/DRESSINGS) ×1 IMPLANT
DRSG AQUACEL AG ADV 3.5X10 (GAUZE/BANDAGES/DRESSINGS) ×2 IMPLANT
DRSG AQUACEL AG SP 3.5X10 (GAUZE/BANDAGES/DRESSINGS) ×2
DURAPREP 26ML APPLICATOR (WOUND CARE) ×4 IMPLANT
ELECT CAUTERY BLADE 6.4 (BLADE) ×2 IMPLANT
ELECT REM PT RETURN 9FT ADLT (ELECTROSURGICAL) ×2
ELECTRODE REM PT RTRN 9FT ADLT (ELECTROSURGICAL) ×1 IMPLANT
EVACUATOR 1/8 PVC DRAIN (DRAIN) IMPLANT
FACESHIELD WRAPAROUND (MASK) ×4 IMPLANT
GLOVE BIOGEL PI IND STRL 7.0 (GLOVE) ×1 IMPLANT
GLOVE BIOGEL PI INDICATOR 7.0 (GLOVE) ×1
GLOVE ORTHO TXT STRL SZ7.5 (GLOVE) ×2 IMPLANT
GLOVE SURG ORTHO 7.0 STRL STRW (GLOVE) ×2 IMPLANT
GOWN STRL REUS W/ TWL LRG LVL3 (GOWN DISPOSABLE) ×2 IMPLANT
GOWN STRL REUS W/ TWL XL LVL3 (GOWN DISPOSABLE) ×1 IMPLANT
GOWN STRL REUS W/TWL LRG LVL3 (GOWN DISPOSABLE) ×2
GOWN STRL REUS W/TWL XL LVL3 (GOWN DISPOSABLE) ×1
HANDPIECE INTERPULSE COAX TIP (DISPOSABLE) ×1
IMMOBILIZER KNEE 22 UNIV (SOFTGOODS) ×2 IMPLANT
IMMOBILIZER KNEE 24 THIGH 36 (MISCELLANEOUS) IMPLANT
IMMOBILIZER KNEE 24 UNIV (MISCELLANEOUS)
KIT BASIN OR (CUSTOM PROCEDURE TRAY) ×2 IMPLANT
KIT ROOM TURNOVER OR (KITS) ×2 IMPLANT
MANIFOLD NEPTUNE II (INSTRUMENTS) ×2 IMPLANT
NEEDLE 18GX1X1/2 (RX/OR ONLY) (NEEDLE) ×2 IMPLANT
NEEDLE HYPO 25GX1X1/2 BEV (NEEDLE) ×2 IMPLANT
NS IRRIG 1000ML POUR BTL (IV SOLUTION) ×2 IMPLANT
PACK TOTAL JOINT (CUSTOM PROCEDURE TRAY) ×2 IMPLANT
PACK UNIVERSAL I (CUSTOM PROCEDURE TRAY) ×2 IMPLANT
PAD ARMBOARD 7.5X6 YLW CONV (MISCELLANEOUS) ×4 IMPLANT
SET HNDPC FAN SPRY TIP SCT (DISPOSABLE) ×1 IMPLANT
STRIP CLOSURE SKIN 1/2X4 (GAUZE/BANDAGES/DRESSINGS) ×2 IMPLANT
SUCTION FRAZIER HANDLE 10FR (MISCELLANEOUS) ×1
SUCTION TUBE FRAZIER 10FR DISP (MISCELLANEOUS) ×1 IMPLANT
SUT MNCRL AB 4-0 PS2 18 (SUTURE) ×2 IMPLANT
SUT VIC AB 0 CT1 27 (SUTURE)
SUT VIC AB 0 CT1 27XBRD ANBCTR (SUTURE) IMPLANT
SUT VIC AB 1 CTX 36 (SUTURE) ×1
SUT VIC AB 1 CTX36XBRD ANBCTR (SUTURE) ×1 IMPLANT
SUT VIC AB 2-0 CT1 27 (SUTURE) ×2
SUT VIC AB 2-0 CT1 TAPERPNT 27 (SUTURE) ×2 IMPLANT
SYR 50ML LL SCALE MARK (SYRINGE) ×2 IMPLANT
SYR CONTROL 10ML LL (SYRINGE) ×2 IMPLANT
TOWEL OR 17X24 6PK STRL BLUE (TOWEL DISPOSABLE) ×2 IMPLANT
TOWEL OR 17X26 10 PK STRL BLUE (TOWEL DISPOSABLE) ×2 IMPLANT
TRAY CATH 16FR W/PLASTIC CATH (SET/KITS/TRAYS/PACK) IMPLANT
WATER STERILE IRR 1000ML POUR (IV SOLUTION) ×2 IMPLANT

## 2017-04-09 NOTE — Op Note (Signed)
NAME:  Lindsey Shaw, Lindsey Shaw NO.:  MEDICAL RECORD NO.:  [id number]  LOCATION:                                 FACILITY:  PHYSICIAN:  Ninetta Lights, M.D. DATE OF BIRTH:  Oct 25, 1941  DATE OF PROCEDURE:  04/09/2017 DATE OF DISCHARGE:                              OPERATIVE REPORT   POSTOPERATIVE DIAGNOSIS:  Right knee end-stage arthritis, primary localized.  POSTOPERATIVE DIAGNOSIS:  Right knee end-stage arthritis, primary localized.  PROCEDURE:  Right knee modified minimally invasive total knee replacement Stryker triathlon prosthesis.  A cemented pegged cruciate retaining #3 femoral component.  Cemented #4 tibial component and 9-mm CS insert.  Cemented pegged medial offset 32 mm patellar component.  SURGEON:  Ninetta Lights, M.D.  ASSISTANT:  Elmyra Ricks, PA, present throughout the entire case and necessary for timely completion of procedure.  ANESTHESIA:  Spinal.  BLOOD LOSS:  Minimal.  SPECIMENS:  None.  CULTURES:  None.  COMPLICATIONS:  None.  DRESSINGS:  Soft compressive knee immobilizer.  TOURNIQUET TIME:  45 minutes.  DESCRIPTION OF PROCEDURE:  The patient was brought to the operating room and after adequate anesthesia had been obtained, tourniquet applied. Prepped and draped in usual sterile fashion.  Exsanguinated with elevation of [place].  Tourniquet was inflated to 350 mmHg.  Straight incision above the patella down to tibial tubercle.  Medial arthrotomy, vastus splitting.  Flexible intramedullary guide distal femur.  An 8 mm resection 5 degrees of valgus.  Using epicondylar axis, femur was sized, cut, and fitted for a pegged cruciate-retaining #3 component.  Proximal tibial resection with extramedullary guide.  Size to #4 component, rotation set with trials, hand reamed.  Patella exposed.  Posterior 10 mm removed.  Drilled, sized, and fitted for a 32-mm component.  After appropriate trials, the knee was copiously irrigated.   Cement prepared, placed on all components, firmly seated.  Polyethylene was attached to tibia, knee reduced.  Patella held with a clamp.  Once the cement hardened, the knee was examined.  Full motion, excellent biomechanical axis, nicely balanced knee, good patellar tracking.  Irrigated again. Soft tissue was injected with Exparel.  Arthrotomy closed with #1 Vicryl with the subcutaneous subcuticular closure.  Sterile compressive dressing applied.  Tourniquet deflated and removed.  Knee immobilizer applied. Anesthesia reversed.  Brought to the recovery room.  Tolerated the surgery well.  No complications.     Ninetta Lights, M.D.     DFM/MEDQ  D:  04/09/2017  T:  04/09/2017  Job:  [id number]

## 2017-04-09 NOTE — Discharge Summary (Addendum)
Patient ID: Lindsey Shaw MRN: [id number] DOB/AGE: [birth year] 76 y.o.  Admit date: 04/09/2017 Discharge date: 04/10/2017  Admission Diagnoses:  Principal Problem:   Primary localized osteoarthritis of right knee Active Problems:   Abnormal EKG   Uncontrolled type 2 diabetes mellitus without complication, without long-term current use of insulin ([place])   Essential hypertension   Discharge Diagnoses:  Same  Past Medical History:  Diagnosis Date  . Anxiety   . Arthritis   . Blood clotting tendency ([place])    2006   BLOOD CLOTT LEG TO GROIN  . Cancer ([place])    MELANOMA   . Chronic kidney disease    Stage 3 Kidney Disease  . Depression   . Diabetes mellitus without complication ([place])   . 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[place]) 2006   Left leg  . GERD (gastroesophageal reflux disease)   . Gout   . Hyperlipidemia   . Hypertension   . Mental disorder   . Myocardial infarction ([place])    10+ YRS (Lexiscan: no evid of ischemia/infarct, EF 72% 06/23/12 [facility])    Surgeries: Procedure(s): RIGHT TOTAL KNEE ARTHROPLASTY on 04/09/2017   Consultants:   Discharged Condition: Improved  Hospital Course: Lindsey Shaw is an 76 y.o. female who was admitted 04/09/2017 for operative treatment ofPrimary localized osteoarthritis of right knee. Patient has severe unremitting pain that affects sleep, daily activities, and work/hobbies. After pre-op clearance the patient was taken to the operating room on 04/09/2017 and underwent  Procedure(s): RIGHT TOTAL KNEE ARTHROPLASTY.    Patient was given perioperative antibiotics:  Anti-infectives    Start     Dose/Rate Route Frequency Ordered Stop   04/09/17 2200  vancomycin (VANCOCIN) IVPB 1000 mg/200 mL premix     1,000 mg 200 mL/hr over 60 Minutes Intravenous Every 12 hours 04/09/17 1522 04/09/17 2302   04/09/17 0930  vancomycin (VANCOCIN) IVPB 1000 mg/200 mL premix     1,000 mg 200 mL/hr over 60 Minutes Intravenous To ShortStay Surgical 04/08/17  0840 04/09/17 1045       Patient was given sequential compression devices, early ambulation, and chemoprophylaxis to prevent DVT.  Patient benefited maximally from hospital stay and there were no complications.    Recent vital signs:  Patient Vitals for the past 24 hrs:  BP Temp Temp src Pulse Resp SpO2  04/10/17 0806 (!) 122/56 - - 68 - -  04/10/17 0631 (!) 103/39 - - - - -  04/10/17 0433 (!) 93/47 99.1 F (37.3 C) Oral 72 18 93 %  04/10/17 0404 101/64 - - 77 - -  04/10/17 0348 (!) 92/44 - - - - -  04/10/17 0347 (!) 89/43 - - - - -  04/10/17 0302 (!) 72/41 - - - - -  04/10/17 0230 (!) 86/46 - - - - -  04/10/17 0004 (!) 83/42 98.9 F (37.2 C) Oral 72 18 92 %  04/09/17 1651 (!) 123/53 97.6 F (36.4 C) Oral 63 18 100 %  04/09/17 1531 140/67 98.3 F (36.8 C) Oral 61 19 100 %  04/09/17 1500 - 97.5 F (36.4 C) - (!) 56 19 97 %  04/09/17 1445 (!) 142/54 - - (!) 59 11 100 %  04/09/17 1430 (!) 129/95 - - (!) 56 16 97 %  04/09/17 1415 131/69 - - (!) 55 13 97 %  04/09/17 1345 128/63 - - (!) 55 11 99 %  04/09/17 1330 118/66 - - (!) 59 16 100 %  04/09/17 1315 - - - (!)  56 12 99 %  04/09/17 1300 134/70 - - (!) 54 11 100 %  04/09/17 1245 - - - (!) 56 10 93 %  04/09/17 1230 131/61 - - (!) 56 10 98 %  04/09/17 1215 123/74 - - (!) 59 (!) 23 100 %  04/09/17 1200 111/75 97.6 F (36.4 C) - 68 (!) 23 95 %     Recent laboratory studies:   Recent Labs  04/10/17 0534  WBC 6.6  HGB 8.4*  HCT 24.2*  PLT 151  NA 131*  K 4.1  CL 101  CO2 22  BUN 24*  CREATININE 1.33*  GLUCOSE 150*  CALCIUM 8.2*     Discharge Medications:   Allergies as of 04/10/2017      Reactions   Keflex [cephalexin] Swelling   SWELLING REACTION UNSPECIFIED       Medication List    STOP taking these medications   acetaminophen 500 MG tablet Commonly known as:  TYLENOL   aspirin EC 81 MG tablet   colchicine 0.6 MG tablet   diclofenac sodium 1 % Gel Commonly known as:  VOLTAREN   ULORIC 80 MG  Tabs Generic drug:  Febuxostat     TAKE these medications   apixaban 2.5 MG Tabs tablet Commonly known as:  ELIQUIS Take 1 tab po q12 hours x 30 days following surgery to prevent blood clots   atorvastatin 40 MG tablet Commonly known as:  LIPITOR Take 40 mg by mouth every evening.   citalopram 20 MG tablet Commonly known as:  CELEXA Take 20 mg by mouth every evening.   losartan-hydrochlorothiazide 100-25 MG tablet Commonly known as:  HYZAAR Take 1 tablet by mouth at bedtime.   metFORMIN 500 MG 24 hr tablet Commonly known as:  GLUCOPHAGE-XR Take 500 mg by mouth every evening.   omeprazole 20 MG capsule Commonly known as:  PRILOSEC Take 20 mg by mouth every evening.   ondansetron 4 MG tablet Commonly known as:  ZOFRAN Take 1 tablet (4 mg total) by mouth every 8 (eight) hours as needed for nausea or vomiting.   oxyCODONE-acetaminophen 5-325 MG tablet Commonly known as:  PERCOCET Take 1-2 tablets by mouth every 4 (four) hours as needed for severe pain. What changed:  how much to take  when to take this  reasons to take this       Diagnostic Studies: Dg Knee Right Port  Result Date: 04/09/2017 CLINICAL DATA:  Postop knee replacement EXAM: PORTABLE RIGHT KNEE - 1-2 VIEW COMPARISON:  None. FINDINGS: The right knee demonstrates a total knee arthroplasty without evidence of hardware failure complication. There is no significant joint effusion. There is no fracture or dislocation. The alignment is anatomic. Post-surgical changes noted in the surrounding soft tissues. IMPRESSION: 1.  Interval right total knee arthroplasty. Electronically Signed   By: Kathreen Devoid   On: 04/09/2017 12:57    Disposition: 01-Home or Self Care    Follow-up Information    Ninetta Lights, MD. Schedule an appointment as soon as possible for a visit in 2 week(s).   Specialty:  Orthopedic Surgery Contact information: [address] [place] [address] [phone]             Signed: Fannie Knee 04/10/2017, 8:23 AM

## 2017-04-09 NOTE — Interval H&P Note (Signed)
History and Physical Interval Note:  04/09/2017 9:09 AM  [place]  has presented today for surgery, with the diagnosis of OA RIGHT KNEE  The various methods of treatment have been discussed with the patient and family. After consideration of risks, benefits and other options for treatment, the patient has consented to  Procedure(s): RIGHT TOTAL KNEE ARTHROPLASTY (Right) as a surgical intervention .  The patient's history has been reviewed, patient examined, no change in status, stable for surgery.  I have reviewed the patient's chart and labs.  Questions were answered to the patient's satisfaction.     Ninetta Lights

## 2017-04-09 NOTE — Anesthesia Procedure Notes (Signed)
Spinal  Patient location during procedure: OR Staffing Anesthesiologist: Lyndle Herrlich Preanesthetic Checklist Completed: patient identified, site marked, surgical consent, pre-op evaluation, timeout performed, IV checked, risks and benefits discussed and monitors and equipment checked Spinal Block Patient position: sitting Prep: DuraPrep Patient monitoring: heart rate, cardiac monitor, continuous pulse ox and blood pressure Approach: midline Location: L2-3 Injection technique: single-shot Needle Needle type: Sprotte  Needle gauge: 24 G Needle length: 9 cm Assessment Sensory level: T4 Additional Notes Spinal Dosage in OR  .75% Bupivicaine ml       1.6 RLD x 3 min

## 2017-04-09 NOTE — Anesthesia Procedure Notes (Signed)
Anesthesia Regional Block: Adductor canal block   Pre-Anesthetic Checklist: ,, timeout performed, Correct Patient, Correct Site, Correct Laterality, Correct Procedure, Correct Position, site marked, Risks and benefits discussed, pre-op evaluation,  At surgeon's request and post-op pain management  Laterality: Right  Prep: chloraprep       Needles:   Needle Type: Echogenic Needle     Needle Length: 9cm  Needle Gauge: 21     Additional Needles:   Procedures: ultrasound guided,,,,,,,,  Narrative:  Start time: 04/09/2017 9:40 AM End time: 04/09/2017 9:46 AM Injection made incrementally with aspirations every 5 mL. Anesthesiologist: Lyndle Herrlich

## 2017-04-09 NOTE — Anesthesia Preprocedure Evaluation (Addendum)
Anesthesia Evaluation  Patient identified by MRN, date of birth, ID band Patient awake    Reviewed: Allergy & Precautions, H&P , Patient's Chart, lab work & pertinent test results  Airway Mallampati: II  TM Distance: >3 FB Neck ROM: full    Dental no notable dental hx.    Pulmonary    Pulmonary exam normal breath sounds clear to auscultation       Cardiovascular Exercise Tolerance: Good hypertension,  Rhythm:regular Rate:Normal     Neuro/Psych    GI/Hepatic   Endo/Other  diabetes  Renal/GU      Musculoskeletal   Abdominal   Peds  Hematology   Anesthesia Other Findings No anticoags Good am BS   Reproductive/Obstetrics                             Anesthesia Physical Anesthesia Plan  ASA: III  Anesthesia Plan: Spinal   Post-op Pain Management:  Regional for Post-op pain   Induction:   Airway Management Planned:   Additional Equipment:   Intra-op Plan:   Post-operative Plan:   Informed Consent: I have reviewed the patients History and Physical, chart, labs and discussed the procedure including the risks, benefits and alternatives for the proposed anesthesia with the patient or authorized representative who has indicated his/her understanding and acceptance.     Plan Discussed with:   Anesthesia Plan Comments: (  )       Anesthesia Quick Evaluation

## 2017-04-09 NOTE — Progress Notes (Signed)
Orthopedic Tech Progress Note Patient Details:  Lindsey Shaw 1941/06/22 [id number]  CPM Right Knee CPM Right Knee: On Right Knee Flexion (Degrees): 90 Right Knee Extension (Degrees): 0 Additional Comments: trapeze bar patient helper   [name] 04/09/2017, 1:57 PM Viewed order from doctor's order list

## 2017-04-09 NOTE — Transfer of Care (Signed)
Immediate Anesthesia Transfer of Care Note  Patient: Lindsey Shaw  Procedure(s) Performed: Procedure(s): RIGHT TOTAL KNEE ARTHROPLASTY (Right)  Patient Location: PACU  Anesthesia Type:Spinal  Level of Consciousness: awake, alert  and oriented  Airway & Oxygen Therapy: Patient Spontanous Breathing and Patient connected to nasal cannula oxygen  Post-op Assessment: Report given to RN and Post -op Vital signs reviewed and stable  Post vital signs: Reviewed and stable  Last Vitals:  Vitals:   04/09/17 0809  BP: 138/67  Pulse: 60  Resp: 18  Temp: 36.7 C    Last Pain:  Vitals:   04/09/17 0809  TempSrc: Oral         Complications: No apparent anesthesia complications

## 2017-04-10 ENCOUNTER — Encounter (HOSPITAL_COMMUNITY): Payer: Self-pay | Admitting: General Practice

## 2017-04-10 LAB — CBC
HCT: 24.2 % — ABNORMAL LOW (ref 36.0–46.0)
HEMOGLOBIN: 8.4 g/dL — AB (ref 12.0–15.0)
MCH: 31.5 pg (ref 26.0–34.0)
MCHC: 34.7 g/dL (ref 30.0–36.0)
MCV: 90.6 fL (ref 78.0–100.0)
PLATELETS: 151 10*3/uL (ref 150–400)
RBC: 2.67 MIL/uL — AB (ref 3.87–5.11)
RDW: 13.3 % (ref 11.5–15.5)
WBC: 6.6 10*3/uL (ref 4.0–10.5)

## 2017-04-10 LAB — BASIC METABOLIC PANEL
ANION GAP: 8 (ref 5–15)
BUN: 24 mg/dL — AB (ref 6–20)
CHLORIDE: 101 mmol/L (ref 101–111)
CO2: 22 mmol/L (ref 22–32)
Calcium: 8.2 mg/dL — ABNORMAL LOW (ref 8.9–10.3)
Creatinine, Ser: 1.33 mg/dL — ABNORMAL HIGH (ref 0.44–1.00)
GFR, EST AFRICAN AMERICAN: 45 mL/min — AB (ref >60)
GFR, EST NON AFRICAN AMERICAN: 39 mL/min — AB (ref >60)
Glucose, Bld: 150 mg/dL — ABNORMAL HIGH (ref 65–99)
POTASSIUM: 4.1 mmol/L (ref 3.5–5.1)
SODIUM: 131 mmol/L — AB (ref 135–145)

## 2017-04-10 LAB — GLUCOSE, CAPILLARY
GLUCOSE-CAPILLARY: 177 mg/dL — AB (ref 65–99)
GLUCOSE-CAPILLARY: 156 mg/dL — AB (ref 65–99)
GLUCOSE-CAPILLARY: 170 mg/dL — AB (ref 65–99)
Glucose-Capillary: 180 mg/dL — ABNORMAL HIGH (ref 65–99)
Glucose-Capillary: 190 mg/dL — ABNORMAL HIGH (ref 65–99)

## 2017-04-10 MED ORDER — LACTATED RINGERS IV BOLUS (SEPSIS)
250.0000 mL | Freq: Once | INTRAVENOUS | Status: AC
  Administered 2017-04-10: 250 mL via INTRAVENOUS

## 2017-04-10 MED ORDER — SODIUM CHLORIDE 0.9 % IV SOLN
INTRAVENOUS | Status: DC
  Administered 2017-04-10 – 2017-04-11 (×2): via INTRAVENOUS

## 2017-04-10 NOTE — Progress Notes (Signed)
Called after hrs services of [facility] to report pt's hypotensive episode; waiting for call back as of this time.

## 2017-04-10 NOTE — Progress Notes (Signed)
Subjective: 1 Day Post-Op Procedure(s) (LRB): RIGHT TOTAL KNEE ARTHROPLASTY (Right) Patient reports pain as moderate.  Hypotensive last night, but this has resolved.  Some nausea/vomiting yesterday, but none today.  No lightheadedness/dizziness, chest pain/sob.  Tolerating diet.  Objective: Vital signs in last 24 hours: Temp:  [97.5 F (36.4 C)-99.1 F (37.3 C)] 99.1 F (37.3 C) (04/26 0433) Pulse Rate:  [54-77] 68 (04/26 0806) Resp:  [10-23] 18 (04/26 0433) BP: (72-142)/(39-95) 122/56 (04/26 0806) SpO2:  [92 %-100 %] 93 % (04/26 0433)  Intake/Output from previous day: 04/25 0701 - 04/26 0700 In: 1440 [P.O.:440; I.V.:1000] Out: 1830 [Urine:1800; Blood:30] Intake/Output this shift: No intake/output data recorded.   Recent Labs  04/10/17 0534  HGB 8.4*    Recent Labs  04/10/17 0534  WBC 6.6  RBC 2.67*  HCT 24.2*  PLT 151    Recent Labs  04/10/17 0534  NA 131*  K 4.1  CL 101  CO2 22  BUN 24*  CREATININE 1.33*  GLUCOSE 150*  CALCIUM 8.2*   No results for input(s): LABPT, INR in the last 72 hours.  Neurologically intact Neurovascular intact Sensation intact distally Intact pulses distally Dorsiflexion/Plantar flexion intact Incision: dressing C/D/I No cellulitis present Compartment soft  Assessment/Plan: 1 Day Post-Op Procedure(s) (LRB): RIGHT TOTAL KNEE ARTHROPLASTY (Right) Advance diet Up with therapy Plan for discharge tomorrow home with hhpt WBAT RLE ABLA-mild and stable Changing fluids and decreasing rate   Fannie Knee 04/10/2017, 8:18 AM

## 2017-04-10 NOTE — Progress Notes (Signed)
Orthopedic Tech Progress Note Patient Details:  Lindsey Shaw [birth year] [id number]  Patient ID: Lindsey Shaw, female   DOB: Jul 17, 1941, 76 y.o.   MRN: [id number] Pt in to much pain to get in cpm. As per rn  Karolee Stamps 04/10/2017, 6:11 AM

## 2017-04-10 NOTE — Evaluation (Addendum)
Physical Therapy Evaluation Patient Details Name: Lindsey Shaw MRN: [id number] DOB: May 26, 1941 Today's Date: 04/10/2017   History of Present Illness  76 y.o. female admitted to [facility] on 04/09/17 for elective R TKA.  Pt with significant PMHx of MI, HTN, gout, DM, L leg embolus, CKD, lumbar laminectomy, and thoracic kypholasty.    Clinical Impression  Pt is POD #1 and is very sore, only able to make it just out into the hallway with RW before returning to room.  She is very lethargic and had a hard time keeping her eyes open during her HEP.  Pt is planning to go home with her daughter who is an Therapist, sports.  She will likely progress well enough in a day or so do go home.   PT to follow acutely for deficits listed below.       Follow Up Recommendations Home health PT;Supervision for mobility/OOB    Equipment Recommendations  None recommended by PT    Recommendations for Other Services   NA    Precautions / Restrictions Precautions Precautions: Knee;Fall Precaution Booklet Issued: Yes (comment) Precaution Comments: knee exercise handout given and no pillow under surgical knee reviewed Restrictions Weight Bearing Restrictions: Yes RLE Weight Bearing: Weight bearing as tolerated      Mobility  Bed Mobility Overal bed mobility: Needs Assistance Bed Mobility: Supine to Sit     Supine to sit: Min assist;HOB elevated     General bed mobility comments: min assist to help progress right leg to EOB.  Pt using bed rail for leverage.  HOB elevated  Transfers Overall transfer level: Needs assistance Equipment used: Rolling walker (2 wheeled) Transfers: Sit to/from Stand Sit to Stand: Min assist Stand pivot transfers: Min assist       General transfer comment: Min assist to support trunk to get to standing, posterior preference initially, verbal cues for safe RW use.   Ambulation/Gait Ambulation/Gait assistance: Min assist Ambulation Distance (Feet): 25 Feet Assistive device: Rolling  walker (2 wheeled) Gait Pattern/deviations: Step-to pattern;Antalgic Gait velocity: decreased   General Gait Details: Pt with moderately antalgic gait pattern, verbal cues for safe RW use, LE sequencing.  Min assist for balance and to support trunk when WB on her right painful leg.          Balance Overall balance assessment: Needs assistance Sitting-balance support: Feet supported;No upper extremity supported Sitting balance-Leahy Scale: Good     Standing balance support: Bilateral upper extremity supported Standing balance-Leahy Scale: Poor                               Pertinent Vitals/Pain Pain Assessment: 0-10 Pain Score: 6  Pain Location: right knee Pain Descriptors / Indicators: Aching;Burning;Grimacing;Guarding Pain Intervention(s): Limited activity within patient's tolerance;Monitored during session;Repositioned    Home Living Family/patient expects to be discharged to:: Private residence (daughter's house, daughter is an Therapist, sports) Living Arrangements: Other relatives Available Help at Discharge: Family;Available 24 hours/day Type of Home: House Home Access: Stairs to enter Entrance Stairs-Rails: None Entrance Stairs-Number of Steps: 1 Home Layout: Two level;Able to live on main level with bedroom/bathroom Home Equipment: Shower seat;Bedside commode;Walker - 2 wheels;Cane - quad;Cane - single point;Adaptive equipment Additional Comments: Lives with grandddaughter and great granddaughter; staying with dtr at d/c.    Prior Function Level of Independence: Independent               Hand Dominance   Dominant Hand: Right  Extremity/Trunk Assessment   Upper Extremity Assessment Upper Extremity Assessment: Defer to OT evaluation    Lower Extremity Assessment Lower Extremity Assessment: RLE deficits/detail RLE Deficits / Details: right leg with normal post op pain and weakness, ankle 3/5, knee 2-/5, hip flexion 2+/5 RLE Sensation:  (intact to  LT)    Cervical / Trunk Assessment Cervical / Trunk Assessment: Other exceptions Cervical / Trunk Exceptions: has had a thoracic kypholasty and a lumbar laminectomy.   Communication   Communication: No difficulties  Cognition Arousal/Alertness: Lethargic;Suspect due to medications Behavior During Therapy: [facility] for tasks assessed/performed Overall Cognitive Status: Within Functional Limits for tasks assessed                                           Exercises Total Joint Exercises Ankle Circles/Pumps: AROM;Both;20 reps Quad Sets: AROM;Right;5 reps;Other (comment) (only did 5 as pt was falling asleep) Towel Squeeze: AROM;Both;10 reps Heel Slides: AAROM;Right;10 reps Goniometric ROM: 12-45   Assessment/Plan    PT Assessment Patient needs continued PT services  PT Problem List Decreased strength;Decreased range of motion;Decreased activity tolerance;Decreased balance;Decreased mobility;Decreased knowledge of use of DME;Decreased knowledge of precautions;Pain       PT Treatment Interventions DME instruction;Gait training;Stair training;Functional mobility training;Therapeutic activities;Therapeutic exercise;Balance training;Patient/family education;Manual techniques;Modalities    PT Goals (Current goals can be found in the Care Plan section)  Acute Rehab PT Goals Patient Stated Goal: to decrease pain, go home tomorrow PT Goal Formulation: With patient Time For Goal Achievement: 04/17/17 Potential to Achieve Goals: Good    Frequency 7X/week    End of Session   Activity Tolerance: Patient limited by pain;Patient limited by fatigue Patient left: in chair;with call bell/phone within reach;with family/visitor present   PT Visit Diagnosis: Muscle weakness (generalized) (M62.81);Difficulty in walking, not elsewhere classified (R26.2);Pain Pain - Right/Left: Right Pain - part of body: Knee    Time: 8768-1157 PT Time Calculation (min) (ACUTE ONLY): 19  min   Charges:     Wells Guiles B. Marianny Goris, PT, DPT [phone]    PT Evaluation $PT Eval Moderate Complexity: 1 Procedure     04/10/2017, 3:57 PM

## 2017-04-10 NOTE — Evaluation (Signed)
Occupational Therapy Evaluation Patient Details Name: Lindsey Shaw MRN: [id number] DOB: [birth year] Today's Date: 04/10/2017    History of Present Illness s/p RIGHT TOTAL KNEE ARTHROPLASTY (Right). PMH includes, but not limited to: Abnormal EKG, Uncontrolled type 2 diabetes mellitus without complication, without long-term current use of insulin ([place]), HTN.   Clinical Impression   Pt admitted with the above diagnoses and presents with below problem list. Pt will benefit from continued OT to address the below listed deficits and maximize independence with basic ADLs prior to d/c home with family assisting as needed. PTA pt was independent with ADLs. Pt currently min to mod A with LB ADLs and functional transfers. Pt limited by 10/10 pain in R knee. Nursing notified and gave pain med during session. SPT recliner to/from [facility] during session. Uncontrolled descent on first standing attempt during session with pt citing pain. Session details below.     Follow Up Recommendations  Home health OT;Supervision/Assistance - 24 hour    Equipment Recommendations  None recommended by OT    Recommendations for Other Services       Precautions / Restrictions Precautions Precautions: Knee;Fall Precaution Comments: reviewed knee precautions Restrictions Weight Bearing Restrictions: Yes RLE Weight Bearing: Weight bearing as tolerated      Mobility Bed Mobility               General bed mobility comments: up in chair  Transfers Overall transfer level: Needs assistance Equipment used: Rolling walker (2 wheeled) Transfers: Sit to/from [facility] Sit to Stand: Mod assist;Min assist Stand pivot transfers: Min assist       General transfer comment: 10/10 pain impacting performance. Uncontrolled descent back into chair on first standing attempt during session.     Balance                                           ADL either performed or assessed  with clinical judgement   ADL Overall ADL's : Needs assistance/impaired Eating/Feeding: Set up;Sitting   Grooming: Set up;Sitting   Upper Body Bathing: Set up;Sitting   Lower Body Bathing: Moderate assistance;Sit to/from stand   Upper Body Dressing : Set up;Sitting   Lower Body Dressing: Moderate assistance;Sit to/from stand   Toilet Transfer: Moderate assistance;Stand-pivot;BSC;RW   Toileting- Clothing Manipulation and Hygiene: Moderate assistance;Sit to/from stand Toileting - Clothing Manipulation Details (indicate cue type and reason): total assist to complete pericare with pt standing with min A with rw.        General ADL Comments: Pt needing to void upon therapist arrival. Attempted to ambulate to bathroom but ultimately completed as SPT to [facility] due to 10/10 R knee pain. Pt with uncontrolled descent into recliner on first standing attempt during session; cited pain as cause. Toilet trasnfer and pericare as detailed above.      Vision         Perception     Praxis      Pertinent Vitals/Pain Pain Assessment: 0-10 Pain Score: 10-Worst pain ever Pain Location: R knee Pain Descriptors / Indicators: Aching;Grimacing;Operative site guarding;Sore Pain Intervention(s): Limited activity within patient's tolerance;Monitored during session;Repositioned;Patient requesting pain meds-RN notified;RN gave pain meds during session;Ice applied     Hand Dominance Right   Extremity/Trunk Assessment Upper Extremity Assessment Upper Extremity Assessment: Overall WFL for tasks assessed;Generalized weakness   Lower Extremity Assessment Lower Extremity Assessment: Defer to PT evaluation  Communication Communication Communication: No difficulties   Cognition Arousal/Alertness: Awake/alert Behavior During Therapy: WFL for tasks assessed/performed Overall Cognitive Status: Within Functional Limits for tasks assessed                                     General  Comments       Exercises     Shoulder Instructions      Home Living Family/patient expects to be discharged to:: Private residence Living Arrangements: Other relatives Available Help at Discharge: Family;Available 24 hours/day Type of Home: House Home Access: Stairs to enter [employer] of Steps: 1   Home Layout: Two level;Able to live on main level with bedroom/bathroom     Bathroom Shower/Tub: Walk-in shower         Home Equipment: Shower seat;Bedside commode;Walker - 2 wheels;Cane - quad;Cane - single point;Adaptive equipment Adaptive Equipment: [employer] Additional Comments: Lives with grandddaughter and great granddaughter; staying with dtr at d/c.      Prior Functioning/Environment Level of Independence: Independent                 OT Problem List: Impaired balance (sitting and/or standing);Decreased knowledge of use of DME or AE;Decreased knowledge of precautions;Pain      OT Treatment/Interventions: Self-care/ADL training;DME and/or AE instruction;Therapeutic activities;Patient/family education;Balance training    OT Goals(Current goals can be found in the care plan section) Acute Rehab OT Goals Patient Stated Goal: pain control, home when ready OT Goal Formulation: With patient/family Time For Goal Achievement: 04/24/17 Potential to Achieve Goals: Good ADL Goals Pt Will Perform Lower Body Bathing: with min guard assist;sit to/from stand Pt Will Perform Lower Body Dressing: with min guard assist;sit to/from stand Pt Will Transfer to Toilet: with min guard assist;ambulating (3n1 over toilet) Pt Will Perform Toileting - Clothing Manipulation and hygiene: with min guard assist;sit to/from stand Pt Will Perform Tub/Shower Transfer: Shower transfer;with min guard assist;ambulating;rolling walker  OT Frequency: Min 2X/week   Barriers to D/C:            Co-evaluation              End of Session Equipment Utilized During Treatment: Rolling  walker CPM Right Knee CPM Right Knee: Off Nurse Communication: Patient requests pain meds  Activity Tolerance: Patient limited by pain Patient left: in chair;with call bell/phone within reach;with nursing/sitter in room;with family/visitor present  OT Visit Diagnosis: Unsteadiness on feet (R26.81);Pain Pain - Right/Left: Right Pain - part of body: Knee                Time: 1411-1430 OT Time Calculation (min): 19 min Charges:  OT General Charges $OT Visit: 1 Procedure OT Evaluation $OT Eval Low Complexity: 1 Procedure G-Codes:       Hortencia Pilar 04/10/2017, 2:47 PM

## 2017-04-10 NOTE — Care Management Note (Signed)
Case Management Note  Patient Details  Name: Lindsey Shaw MRN: [id number] Date of Birth: October 31, 1941  Subjective/Objective:      76 yr old female s/p right total knee arthroplasty. Patient has been experiencing low BP's, received IV fluid bolus this am.               Action/Plan: Case manager spoke with patient concerning discharge plan and DME needs. Patient was preoperatively setup with [facility], no changes. Patient states she will go to her daughters home for recovery: [address] [place], [place]. She has received her DME.    Expected Discharge Date:  04/11/17               Expected Discharge Plan:  [place]  In-House Referral:     Discharge planning Services  CM Consult  Post Acute Care Choice:  Home Health Choice offered to:  Patient  DME Arranged:  CPM (has RW and 3in1) DME Agency:  [employer]  HH Arranged:  PT HH Agency:  [place]  Status of Service:  Completed, signed off  If discussed at [place] of Stay Meetings, dates discussed:    Additional Comments:  Ninfa Meeker, RN 04/10/2017, 1:55 PM

## 2017-04-10 NOTE — Discharge Instructions (Signed)
INSTRUCTIONS AFTER JOINT REPLACEMENT  ° °o Remove items at home which could result in a fall. This includes throw rugs or furniture in walking pathways °o ICE to the affected joint every three hours while awake for 30 minutes at a time, for at least the first 3-5 days, and then as needed for pain and swelling.  Continue to use ice for pain and swelling. You may notice swelling that will progress down to the foot and ankle.  This is normal after surgery.  Elevate your leg when you are not up walking on it.   °o Continue to use the breathing machine you got in the hospital (incentive spirometer) which will help keep your temperature down.  It is common for your temperature to cycle up and down following surgery, especially at night when you are not up moving around and exerting yourself.  The breathing machine keeps your lungs expanded and your temperature down. ° ° °DIET:  As you were doing prior to hospitalization, we recommend a well-balanced diet. ° °DRESSING / WOUND CARE / SHOWERING ° °Keep the surgical dressing until follow up.  The dressing is water proof, so you can shower without any extra covering.  IF THE DRESSING FALLS OFF or the wound gets wet inside, change the dressing with sterile gauze.  Please use good hand washing techniques before changing the dressing.  Do not use any lotions or creams on the incision until instructed by your surgeon.   ° °ACTIVITY ° °o Increase activity slowly as tolerated, but follow the weight bearing instructions below.   °o No driving for 6 weeks or until further direction given by your physician.  You cannot drive while taking narcotics.  °o No lifting or carrying greater than 10 lbs. until further directed by your surgeon. °o Avoid periods of inactivity such as sitting longer than an hour when not asleep. This helps prevent blood clots.  °o You may return to work once you are authorized by your doctor.  ° ° ° °WEIGHT BEARING  ° °Weight bearing as tolerated with assist  device (walker, cane, etc) as directed, use it as long as suggested by your surgeon or therapist, typically at least 4-6 weeks. ° ° °EXERCISES ° °Results after joint replacement surgery are often greatly improved when you follow the exercise, range of motion and muscle strengthening exercises prescribed by your doctor. Safety measures are also important to protect the joint from further injury. Any time any of these exercises cause you to have increased pain or swelling, decrease what you are doing until you are comfortable again and then slowly increase them. If you have problems or questions, call your caregiver or physical therapist for advice.  ° °Rehabilitation is important following a joint replacement. After just a few days of immobilization, the muscles of the leg can become weakened and shrink (atrophy).  These exercises are designed to build up the tone and strength of the thigh and leg muscles and to improve motion. Often times heat used for twenty to thirty minutes before working out will loosen up your tissues and help with improving the range of motion but do not use heat for the first two weeks following surgery (sometimes heat can increase post-operative swelling).  ° °These exercises can be done on a training (exercise) mat, on the floor, on a table or on a bed. Use whatever works the best and is most comfortable for you.    Use music or television while you are exercising so that   the exercises are a pleasant break in your day. This will make your life better with the exercises acting as a break in your routine that you can look forward to.   Perform all exercises about fifteen times, three times per day or as directed.  You should exercise both the operative leg and the other leg as well. ° °Exercises include: °  °• Quad Sets - Tighten up the muscle on the front of the thigh (Quad) and hold for 5-10 seconds.   °• Straight Leg Raises - With your knee straight (if you were given a brace, keep it on),  lift the leg to 60 degrees, hold for 3 seconds, and slowly lower the leg.  Perform this exercise against resistance later as your leg gets stronger.  °• Leg Slides: Lying on your back, slowly slide your foot toward your buttocks, bending your knee up off the floor (only go as far as is comfortable). Then slowly slide your foot back down until your leg is flat on the floor again.  °• Angel Wings: Lying on your back spread your legs to the side as far apart as you can without causing discomfort.  °• Hamstring Strength:  Lying on your back, push your heel against the floor with your leg straight by tightening up the muscles of your buttocks.  Repeat, but this time bend your knee to a comfortable angle, and push your heel against the floor.  You may put a pillow under the heel to make it more comfortable if necessary.  ° °A rehabilitation program following joint replacement surgery can speed recovery and prevent re-injury in the future due to weakened muscles. Contact your doctor or a physical therapist for more information on knee rehabilitation.  ° ° °CONSTIPATION ° °Constipation is defined medically as fewer than three stools per week and severe constipation as less than one stool per week.  Even if you have a regular bowel pattern at home, your normal regimen is likely to be disrupted due to multiple reasons following surgery.  Combination of anesthesia, postoperative narcotics, change in appetite and fluid intake all can affect your bowels.  ° °YOU MUST use at least one of the following options; they are listed in order of increasing strength to get the job done.  They are all available over the counter, and you may need to use some, POSSIBLY even all of these options:   ° °Drink plenty of fluids (prune juice may be helpful) and high fiber foods °Colace 100 mg by mouth twice a day  °Senokot for constipation as directed and as needed Dulcolax (bisacodyl), take with full glass of water  °Miralax (polyethylene glycol)  once or twice a day as needed. ° °If you have tried all these things and are unable to have a bowel movement in the first 3-4 days after surgery call either your surgeon or your primary doctor.   ° °If you experience loose stools or diarrhea, hold the medications until you stool forms back up.  If your symptoms do not get better within 1 week or if they get worse, check with your doctor.  If you experience "the worst abdominal pain ever" or develop nausea or vomiting, please contact the office immediately for further recommendations for treatment. ° ° °ITCHING:  If you experience itching with your medications, try taking only a single pain pill, or even half a pain pill at a time.  You can also use Benadryl over the counter for itching or also to   help with sleep.   TED HOSE STOCKINGS:  Use stockings on both legs until for at least 2 weeks or as directed by physician office. They may be removed at night for sleeping.  MEDICATIONS:  See your medication summary on the After Visit Summary that nursing will review with you.  You may have some home medications which will be placed on hold until you complete the course of blood thinner medication.  It is important for you to complete the blood thinner medication as prescribed.  PRECAUTIONS:  If you experience chest pain or shortness of breath - call 911 immediately for transfer to the hospital emergency department.   If you develop a fever greater that 101 F, purulent drainage from wound, increased redness or drainage from wound, foul odor from the wound/dressing, or calf pain - CONTACT YOUR SURGEON.                                                   FOLLOW-UP APPOINTMENTS:  If you do not already have a post-op appointment, please call the office for an appointment to be seen by your surgeon.  Guidelines for how soon to be seen are listed in your After Visit Summary, but are typically between 1-4 weeks after surgery.  OTHER INSTRUCTIONS:   Knee  Replacement:  Do not place pillow under knee, focus on keeping the knee straight while resting. CPM instructions: 0-90 degrees, 2 hours in the morning, 2 hours in the afternoon, and 2 hours in the evening. Place foam block, curve side up under heel at all times except when in CPM or when walking.  DO NOT modify, tear, cut, or change the foam block in any way.  MAKE SURE YOU:   Understand these instructions.   Get help right away if you are not doing well or get worse.    Thank you for letting us be a part of your medical care team.  It is a privilege we respect greatly.  We hope these instructions will help you stay on track for a fast and full recovery!   Information on my medicine - ELIQUIS (apixaban)  This medication education was reviewed with me or my healthcare representative as part of my discharge preparation.    Why was Eliquis prescribed for you? Eliquis was prescribed for you to reduce the risk of forming blood clots that can cause a stroke if you have a medical condition called atrial fibrillation (a type of irregular heartbeat) OR to reduce the risk of a blood clots forming after orthopedic surgery.  What do You need to know about Eliquis ? Take your Eliquis TWICE DAILY - one tablet in the morning and one tablet in the evening with or without food.  It would be best to take the doses about the same time each day.  If you have difficulty swallowing the tablet whole please discuss with your pharmacist how to take the medication safely.  Take Eliquis exactly as prescribed by your doctor and DO NOT stop taking Eliquis without talking to the doctor who prescribed the medication.  Stopping may increase your risk of developing a new clot or stroke.  Refill your prescription before you run out.  After discharge, you should have regular check-up appointments with your healthcare provider that is prescribing your Eliquis.  In the future your dose may need  to be changed if your  kidney function or weight changes by a significant amount or as you get older.  What do you do if you miss a dose? If you miss a dose, take it as soon as you remember on the same day and resume taking twice daily.  Do not take more than one dose of ELIQUIS at the same time.  Important Safety Information A possible side effect of Eliquis is bleeding. You should call your healthcare provider right away if you experience any of the following: ? Bleeding from an injury or your nose that does not stop. ? Unusual colored urine (red or dark brown) or unusual colored stools (red or black). ? Unusual bruising for unknown reasons. ? A serious fall or if you hit your head (even if there is no bleeding).  Some medicines may interact with Eliquis and might increase your risk of bleeding or clotting while on Eliquis. To help avoid this, consult your healthcare provider or pharmacist prior to using any new prescription or non-prescription medications, including herbals, vitamins, non-steroidal anti-inflammatory drugs (NSAIDs) and supplements.  This website has more information on Eliquis (apixaban): [email or web address].no.

## 2017-04-10 NOTE — Progress Notes (Signed)
Doc on call from [place] called back and gave orders of LR (250 ml) bolus & EKG  for pt's hypotensive episode;hold BP med (losartan) & dilaudid IV;Pt's BP was 101/64 and pulse rate -77 after the bolus run

## 2017-04-10 NOTE — Progress Notes (Signed)
Physical Therapy Treatment Patient Details Name: Lindsey Shaw MRN: [id number] DOB: November 09, 1941 Today's Date: 04/10/2017    History of Present Illness 76 y.o. female admitted to [facility] on 04/09/17 for elective R TKA.  Pt with significant PMHx of MI, HTN, gout, DM, L leg embolus, CKD, lumbar laminectomy, and thoracic kypholasty.      PT Comments    Pt is POD #1 and this is her second session.  She is limited by generalized weakness and nausea.  BPs are soft but stable after gait to the bathroom and back, but hallway ambulation deferred and RN brought nausea meds.  Pt is min assist overall and tends to tip backwards during gait. She also has a hard time staying awake to do her exercises.  PT to follow acutely, but I think we will at least need two sessions tomorrow and then re-assess going home.    Follow Up Recommendations  Home health PT;Supervision for mobility/OOB     Equipment Recommendations  None recommended by PT    Recommendations for Other Services   NA     Precautions / Restrictions Precautions Precautions: Knee;Fall Precaution Booklet Issued: Yes (comment) Precaution Comments: knee exercise handout given and no pillow under surgical knee reviewed Restrictions RLE Weight Bearing: Weight bearing as tolerated    Mobility  Bed Mobility Overal bed mobility: Needs Assistance Bed Mobility: Supine to Sit     Supine to sit: HOB elevated;Modified independent (Device/Increase time)     General bed mobility comments: Pt using bed rail for leverage and HOB elevated, she is able to slide her right leg to EOB unassisted.  Extra time needed to complete the task on her own.   Transfers Overall transfer level: Needs assistance Equipment used: Rolling walker (2 wheeled) Transfers: Sit to/from Stand Sit to Stand: Min assist         General transfer comment: Min assist to support trunk during transitions, pt with posterior preference once standing, tipped back on her heels.    Ambulation/Gait Ambulation/Gait assistance: Min assist Ambulation Distance (Feet): 15 Feet Assistive device: Rolling walker (2 wheeled) Gait Pattern/deviations: Step-to pattern;Antalgic Gait velocity: decreased   General Gait Details: Pt with moderately antalgic gait pattern and is tipping posteriorly during giat requiring min assist for balance.  Pt lightheaded, weak, and nauseated during gait, so we did not go back into the hallway this PM.  Vitals were checked and stable. BPs are soft, but ok.           Balance Overall balance assessment: Needs assistance Sitting-balance support: Feet supported;No upper extremity supported Sitting balance-Leahy Scale: Good   Postural control: Posterior lean Standing balance support: Bilateral upper extremity supported;Single extremity supported;No upper extremity supported Standing balance-Leahy Scale: Poor                              Cognition Arousal/Alertness: Lethargic Behavior During Therapy: WFL for tasks assessed/performed Overall Cognitive Status: Within Functional Limits for tasks assessed                                        Exercises Total Joint Exercises Ankle Circles/Pumps: AROM;Both;20 reps Quad Sets: AROM;Right;5 reps;Other (comment) (only did 5 as pt was falling asleep) Towel Squeeze: AROM;Both;10 reps Heel Slides: AAROM;Right;10 reps Goniometric ROM: 12-45        Pertinent Vitals/Pain Pain Assessment: 0-10 Pain Score:  7  Pain Location: right knee Pain Descriptors / Indicators: Aching;Burning;Grimacing;Guarding Pain Intervention(s): Limited activity within patient's tolerance;Monitored during session;Repositioned    04/10/17 1737  Vital Signs (after walking supine in bed)  Pulse Rate 61  BP (!) 113/52  BP Location Left Arm  BP Method Automatic  Patient Position (if appropriate) Lying  Oxygen Therapy  SpO2 93 %  O2 Device Room Air     Home Living Family/patient expects  to be discharged to:: Private residence (daughter's house, daughter is an Therapist, sports) Living Arrangements: Other relatives Available Help at Discharge: Family;Available 24 hours/day Type of Home: House Home Access: Stairs to enter Entrance Stairs-Rails: None Home Layout: Two level;Able to live on main level with bedroom/bathroom Home Equipment: Shower seat;Bedside commode;Walker - 2 wheels;Cane - quad;Cane - single point;Adaptive equipment Additional Comments: Lives with grandddaughter and great granddaughter; staying with dtr at d/c.    Prior Function Level of Independence: Independent          PT Goals (current goals can now be found in the care plan section) Acute Rehab PT Goals Patient Stated Goal: to decrease pain, go home tomorrow PT Goal Formulation: With patient Time For Goal Achievement: 04/17/17 Potential to Achieve Goals: Good Progress towards PT goals: Progressing toward goals    Frequency    7X/week      PT Plan Current plan remains appropriate       End of Session Equipment Utilized During Treatment: Gait belt Activity Tolerance: Patient limited by lethargy;Patient limited by pain (by nausea) Patient left: in bed;in CPM;with call bell/phone within reach;with family/visitor present   PT Visit Diagnosis: Muscle weakness (generalized) (M62.81);Difficulty in walking, not elsewhere classified (R26.2);Pain Pain - Right/Left: Right Pain - part of body: Knee     Time: 1715-1800 PT Time Calculation (min) (ACUTE ONLY): 45 min  Charges:  $Gait Training: 8-22 mins $Therapeutic Exercise: 8-22 mins $Therapeutic Activity: 8-22 mins            Amala Petion B. [place], [place], DPT [phone]            04/10/2017, 6:05 PM

## 2017-04-11 LAB — BASIC METABOLIC PANEL
Anion gap: 8 (ref 5–15)
BUN: 25 mg/dL — ABNORMAL HIGH (ref 6–20)
CALCIUM: 8.5 mg/dL — AB (ref 8.9–10.3)
CO2: 25 mmol/L (ref 22–32)
CREATININE: 1.71 mg/dL — AB (ref 0.44–1.00)
Chloride: 99 mmol/L — ABNORMAL LOW (ref 101–111)
GFR calc Af Amer: 33 mL/min — ABNORMAL LOW (ref >60)
GFR calc non Af Amer: 28 mL/min — ABNORMAL LOW (ref >60)
Glucose, Bld: 147 mg/dL — ABNORMAL HIGH (ref 65–99)
Potassium: 3.7 mmol/L (ref 3.5–5.1)
SODIUM: 132 mmol/L — AB (ref 135–145)

## 2017-04-11 LAB — CBC
HCT: 25.3 % — ABNORMAL LOW (ref 36.0–46.0)
HEMOGLOBIN: 8.5 g/dL — AB (ref 12.0–15.0)
MCH: 30.5 pg (ref 26.0–34.0)
MCHC: 33.6 g/dL (ref 30.0–36.0)
MCV: 90.7 fL (ref 78.0–100.0)
Platelets: 137 10*3/uL — ABNORMAL LOW (ref 150–400)
RBC: 2.79 MIL/uL — ABNORMAL LOW (ref 3.87–5.11)
RDW: 12.9 % (ref 11.5–15.5)
WBC: 7.8 10*3/uL (ref 4.0–10.5)

## 2017-04-11 LAB — GLUCOSE, CAPILLARY: GLUCOSE-CAPILLARY: 156 mg/dL — AB (ref 65–99)

## 2017-04-11 NOTE — Progress Notes (Signed)
Physical Therapy Treatment Patient Details Name: Lindsey Shaw MRN: [id number] DOB: 1941/01/27 Today's Date: 04/11/2017    History of Present Illness 76 y.o. female admitted to [facility] on 04/09/17 for elective R TKA.  Pt with significant PMHx of MI, HTN, gout, DM, L leg embolus, CKD, lumbar laminectomy, and thoracic kypholasty.      PT Comments    Pt is POD #2 and progressing significantly better than yesterday.  She was able to progress gait further down the hallway with supervision and the posterior tipping is gone today. Stair training preformed simulating home entry with pt and her daughter as well as HEP program.  Pt is physically ready for d/c.    Follow Up Recommendations  Home health PT;Supervision for mobility/OOB     Equipment Recommendations  None recommended by PT    Recommendations for Other Services   NA     Precautions / Restrictions Precautions Precautions: Knee;Fall Precaution Booklet Issued: Yes (comment) Precaution Comments: knee exercise handout given and no pillow under surgical knee reviewed Restrictions Weight Bearing Restrictions: Yes RLE Weight Bearing: Weight bearing as tolerated    Mobility  Bed Mobility               General bed mobility comments: Pt is OOB in the recliner chair.   Transfers Overall transfer level: Needs assistance Equipment used: Rolling walker (2 wheeled) Transfers: Sit to/from Stand Sit to Stand: Supervision         General transfer comment: supervision for safety, much better today and no noticeable posterior leaning.   Ambulation/Gait Ambulation/Gait assistance: Supervision Ambulation Distance (Feet): 100 Feet Assistive device: Rolling walker (2 wheeled) Gait Pattern/deviations: Step-through pattern;Antalgic Gait velocity: decreased   General Gait Details: Pt with safe RW use, supervision for safety, no posterior tipping today, slow, but steady gait speed   Stairs Stairs: Yes   Stair Management: No  rails;Forwards;Backwards;With walker Number of Stairs: 1 (x2) General stair comments: Practiced both forward and backward up one curb step with the pt.  She did much better forward, so pt and daugther are going to go up their step forward.  Verbal cues for correct LE sequencing.       Balance Overall balance assessment: Needs assistance Sitting-balance support: Feet supported;No upper extremity supported Sitting balance-Leahy Scale: Good     Standing balance support: Bilateral upper extremity supported;No upper extremity supported;Single extremity supported Standing balance-Leahy Scale: Fair                              Cognition Arousal/Alertness: Awake/alert Behavior During Therapy: WFL for tasks assessed/performed Overall Cognitive Status: Within Functional Limits for tasks assessed                                        Exercises Total Joint Exercises Short Arc QuadSinclair Ship;Right;10 reps Hip ABduction/ADduction: AAROM;Right;10 reps Straight Leg Raises: AAROM;Right;10 reps Long Arc Quad: AROM;Right;10 reps Knee Flexion: AROM;AAROM;Right;20 reps Goniometric ROM: 8-65        Pertinent Vitals/Pain Pain Assessment: Faces Faces Pain Scale: Hurts little more Pain Location: right knee Pain Descriptors / Indicators: Aching;Burning;Grimacing;Guarding Pain Intervention(s): Limited activity within patient's tolerance;Monitored during session;Repositioned           PT Goals (current goals can now be found in the care plan section) Acute Rehab PT Goals Patient Stated Goal: to decrease pain, go  home tomorrow Progress towards PT goals: Progressing toward goals    Frequency    7X/week      PT Plan Current plan remains appropriate       End of Session   Activity Tolerance: Patient limited by pain Patient left: in chair;with call bell/phone within reach Nurse Communication: Mobility status PT Visit Diagnosis: Muscle weakness (generalized)  (M62.81);Difficulty in walking, not elsewhere classified (R26.2);Pain Pain - Right/Left: Right Pain - part of body: Knee     Time: 2500-3704 PT Time Calculation (min) (ACUTE ONLY): 33 min  Charges:  $Gait Training: 8-22 mins $Therapeutic Exercise: 8-22 mins          Eyan Hagood B. [place], [place], DPT [phone]            04/11/2017, 11:43 AM

## 2017-04-15 NOTE — Anesthesia Postprocedure Evaluation (Signed)
Anesthesia Post Note  Patient: Lindsey Shaw  Procedure(s) Performed: Procedure(s) (LRB): RIGHT TOTAL KNEE ARTHROPLASTY (Right)  Patient location during evaluation: PACU Anesthesia Type: Spinal Level of consciousness: awake Pain management: satisfactory to patient Vital Signs Assessment: post-procedure vital signs reviewed and stable Respiratory status: spontaneous breathing Cardiovascular status: blood pressure returned to baseline Postop Assessment: no headache and spinal receding Anesthetic complications: no        Last Vitals:  Vitals:   04/10/17 2103 04/11/17 0701  BP: 125/79 (!) 99/57  Pulse: 65 74  Resp: 18 16  Temp: 36.8 C 36.9 C    Last Pain:  Vitals:   04/11/17 0701  TempSrc: Oral  PainSc:    Pain Goal:                 Riccardo Dubin

## 2017-06-27 NOTE — Addendum Note (Signed)
Addendum  created 06/27/17 1201 by Lyndle Herrlich, MD   Sign clinical note

## 2017-06-27 NOTE — Anesthesia Postprocedure Evaluation (Signed)
Anesthesia Post Note  Patient: Lindsey Shaw  Procedure(s) Performed: Procedure(s) (LRB): RIGHT TOTAL KNEE ARTHROPLASTY (Right)     Anesthesia Post Evaluation  Last Vitals:  Vitals:   04/10/17 2103 04/11/17 0701  BP: 125/79 (!) 99/57  Pulse: 65 74  Resp: 18 16  Temp: 36.8 C 36.9 C    Last Pain:  Vitals:   04/11/17 0701  TempSrc: Oral  PainSc:                  Riccardo Dubin

## 2017-12-22 ENCOUNTER — Telehealth: Payer: Self-pay | Admitting: Cardiovascular Disease

## 2017-12-22 NOTE — Telephone Encounter (Signed)
Patient in today and wanted to know if she should have follow up appointment. Dr. Rockey Situ requested note to be entered and he could review chart. Will call patient to let her know when she may need appointment.

## 2017-12-25 ENCOUNTER — Other Ambulatory Visit: Payer: Self-pay | Admitting: Internal Medicine

## 2017-12-25 DIAGNOSIS — Z1231 Encounter for screening mammogram for malignant neoplasm of breast: Secondary | ICD-10-CM

## 2017-12-25 NOTE — Telephone Encounter (Signed)
Patient verbalized understanding of Dr Donivan Scull advice. Advised her that if she needed refills from Dr Rockey Situ in the future, she would need yearly appointment and she understood.

## 2017-12-25 NOTE — Telephone Encounter (Signed)
Does not need regular visit but we are available if new sx develop Would continue to work on diabetes Cholesterol numbers are good No known cardiac disease

## 2018-01-10 IMAGING — CR DG KNEE 1-2V PORT*R*
2 series · 2 of 2 positions shown · non-contrast
Comparison: None.

CLINICAL DATA: Postop knee replacement

EXAM:
PORTABLE RIGHT KNEE - 1-2 VIEW

[AP]
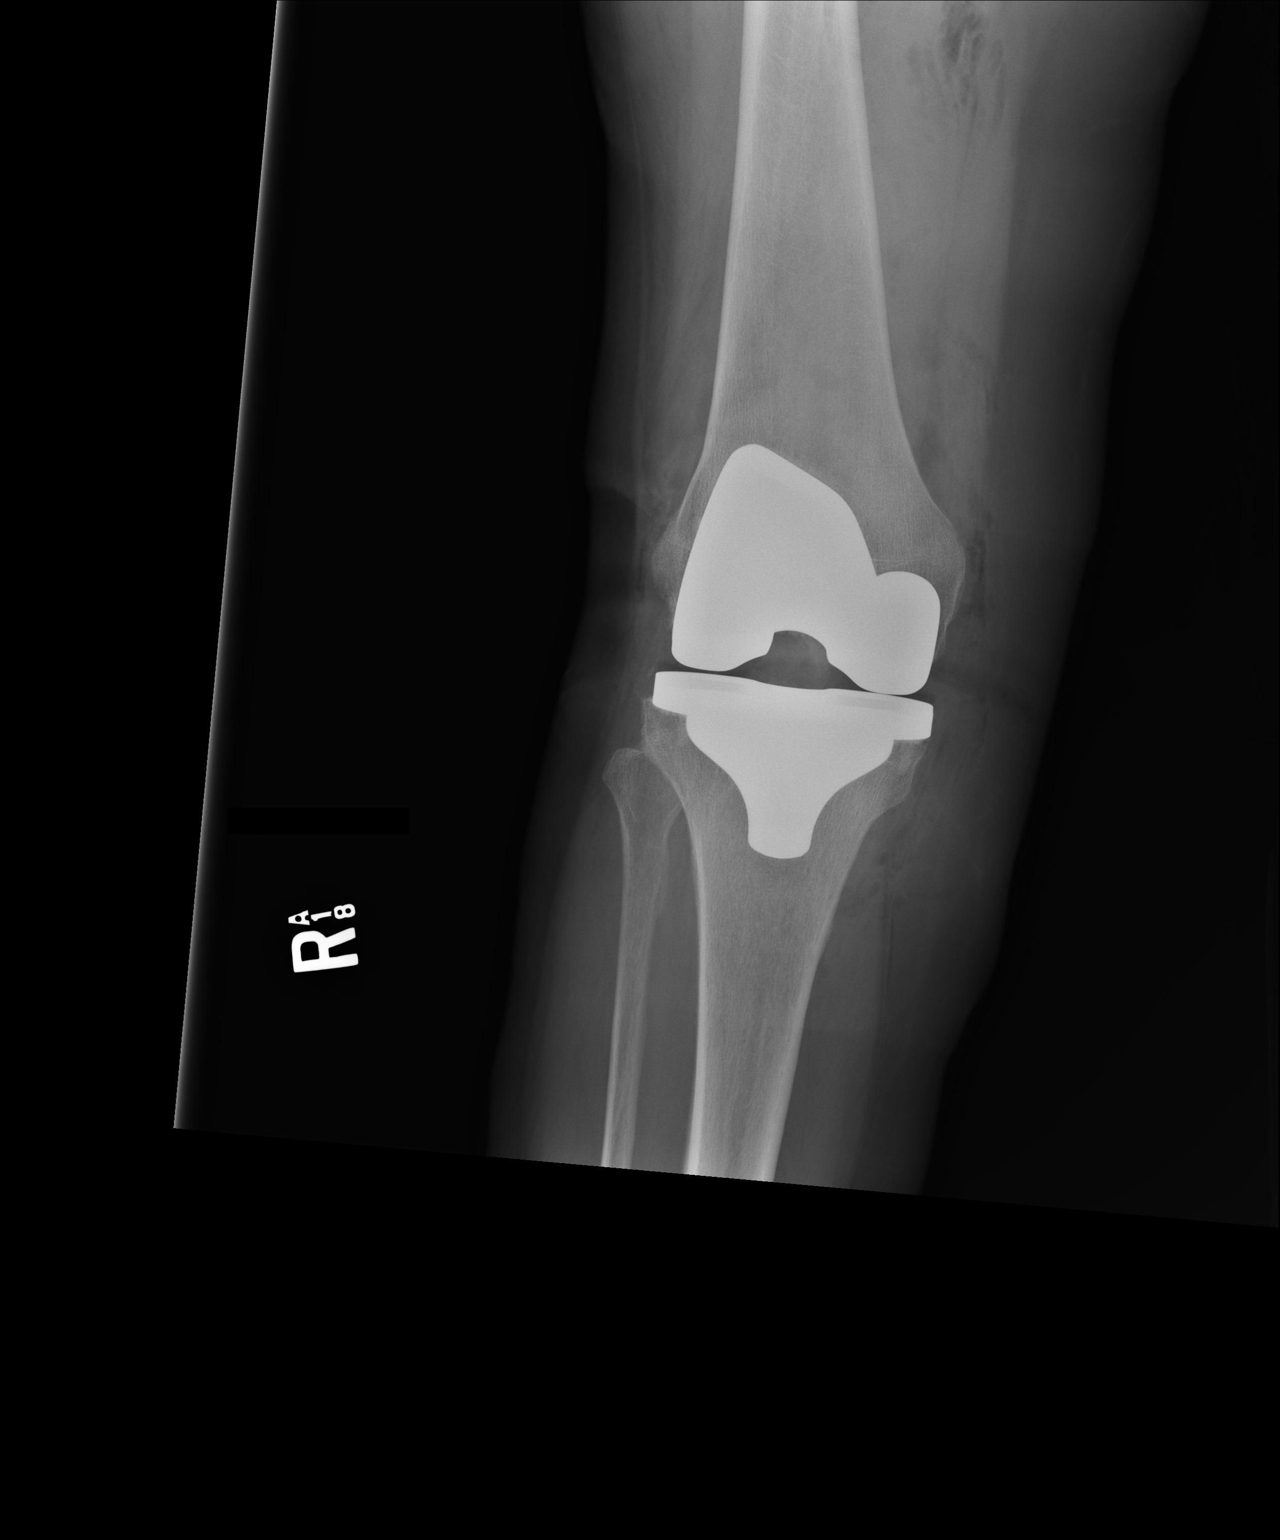

[xtable lateral]
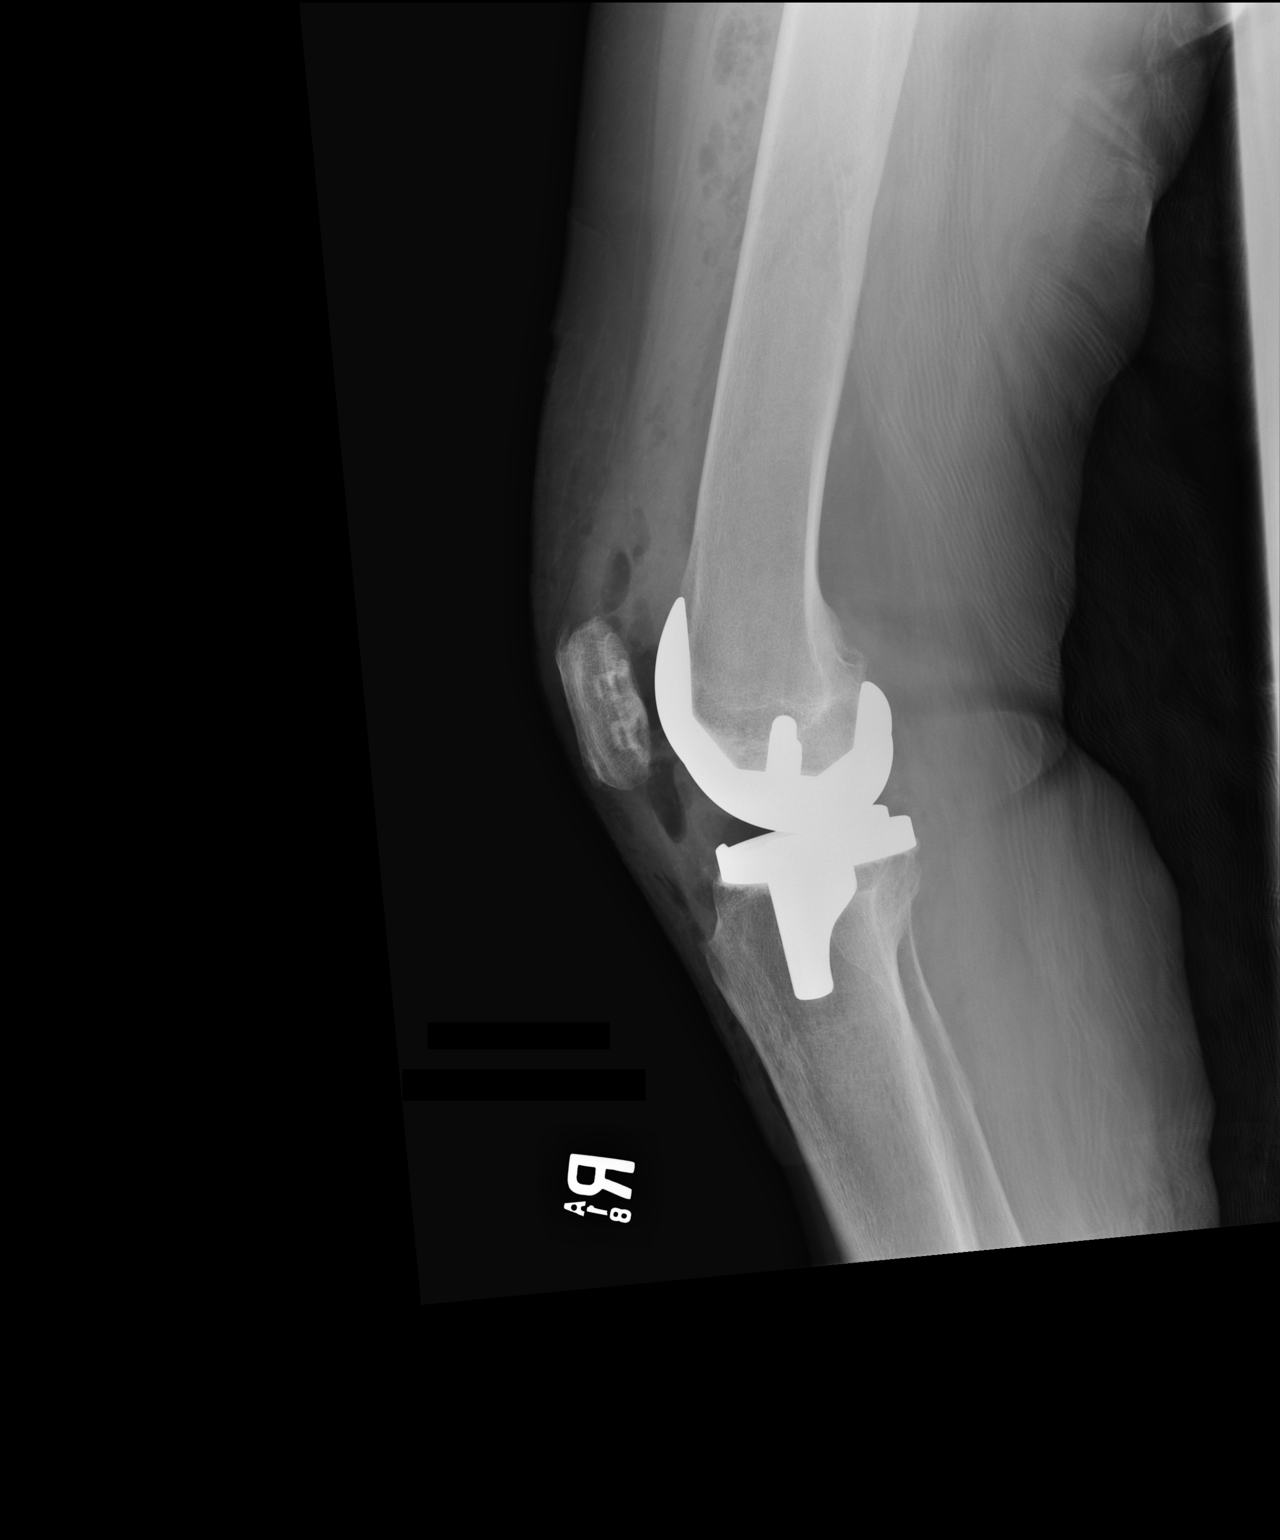

[2 of 2 positions shown; findings below may reference images not displayed]

FINDINGS: The right knee demonstrates a total knee arthroplasty without
evidence of hardware failure complication. There is no significant
joint effusion. There is no fracture or dislocation. The alignment
is anatomic. Post-surgical changes noted in the surrounding soft
tissues.
IMPRESSION: 1.  Interval right total knee arthroplasty.

## 2018-01-12 ENCOUNTER — Other Ambulatory Visit: Payer: Self-pay | Admitting: Internal Medicine

## 2018-01-12 ENCOUNTER — Ambulatory Visit
Admission: RE | Admit: 2018-01-12 | Discharge: 2018-01-12 | Disposition: A | Discharge status: Home / Self Care | Payer: Medicare Other | Source: Ambulatory Visit | Attending: Internal Medicine | Admitting: Internal Medicine

## 2018-01-12 DIAGNOSIS — M79605 Pain in left leg: Secondary | ICD-10-CM

## 2018-02-16 ENCOUNTER — Ambulatory Visit
Admission: RE | Admit: 2018-02-16 | Discharge: 2018-02-16 | Disposition: A | Discharge status: Home / Self Care | Payer: Medicare Other | Source: Ambulatory Visit | Attending: Internal Medicine | Admitting: Internal Medicine

## 2018-02-16 DIAGNOSIS — Z1231 Encounter for screening mammogram for malignant neoplasm of breast: Secondary | ICD-10-CM | POA: Diagnosis not present

## 2018-02-25 ENCOUNTER — Encounter: Payer: Self-pay | Admitting: *Deleted

## 2018-02-25 ENCOUNTER — Other Ambulatory Visit: Payer: Self-pay | Admitting: Physician Assistant

## 2018-02-25 DIAGNOSIS — R1903 Right lower quadrant abdominal swelling, mass and lump: Secondary | ICD-10-CM

## 2018-03-02 ENCOUNTER — Ambulatory Visit
Admission: RE | Admit: 2018-03-02 | Discharge: 2018-03-02 | Disposition: A | Discharge status: Home / Self Care | Payer: Medicare Other | Source: Ambulatory Visit | Attending: Physician Assistant | Admitting: Physician Assistant

## 2018-03-02 DIAGNOSIS — N83201 Unspecified ovarian cyst, right side: Secondary | ICD-10-CM | POA: Diagnosis not present

## 2018-03-02 DIAGNOSIS — R1903 Right lower quadrant abdominal swelling, mass and lump: Secondary | ICD-10-CM | POA: Diagnosis present

## 2018-03-07 ENCOUNTER — Emergency Department: Payer: Medicare Other

## 2018-03-07 ENCOUNTER — Other Ambulatory Visit: Payer: Self-pay

## 2018-03-07 ENCOUNTER — Encounter: Payer: Self-pay | Admitting: Emergency Medicine

## 2018-03-07 DIAGNOSIS — E1122 Type 2 diabetes mellitus with diabetic chronic kidney disease: Secondary | ICD-10-CM | POA: Diagnosis not present

## 2018-03-07 DIAGNOSIS — Z85820 Personal history of malignant melanoma of skin: Secondary | ICD-10-CM | POA: Diagnosis not present

## 2018-03-07 DIAGNOSIS — I252 Old myocardial infarction: Secondary | ICD-10-CM | POA: Diagnosis not present

## 2018-03-07 DIAGNOSIS — M545 Low back pain: Secondary | ICD-10-CM | POA: Diagnosis present

## 2018-03-07 DIAGNOSIS — N183 Chronic kidney disease, stage 3 (moderate): Secondary | ICD-10-CM | POA: Insufficient documentation

## 2018-03-07 DIAGNOSIS — Z7984 Long term (current) use of oral hypoglycemic drugs: Secondary | ICD-10-CM | POA: Insufficient documentation

## 2018-03-07 DIAGNOSIS — M7989 Other specified soft tissue disorders: Secondary | ICD-10-CM | POA: Insufficient documentation

## 2018-03-07 DIAGNOSIS — Z79899 Other long term (current) drug therapy: Secondary | ICD-10-CM | POA: Diagnosis not present

## 2018-03-07 DIAGNOSIS — I129 Hypertensive chronic kidney disease with stage 1 through stage 4 chronic kidney disease, or unspecified chronic kidney disease: Secondary | ICD-10-CM | POA: Insufficient documentation

## 2018-03-07 DIAGNOSIS — R109 Unspecified abdominal pain: Secondary | ICD-10-CM | POA: Insufficient documentation

## 2018-03-07 DIAGNOSIS — Z7901 Long term (current) use of anticoagulants: Secondary | ICD-10-CM | POA: Diagnosis not present

## 2018-03-07 DIAGNOSIS — Z96651 Presence of right artificial knee joint: Secondary | ICD-10-CM | POA: Insufficient documentation

## 2018-03-07 DIAGNOSIS — M79605 Pain in left leg: Secondary | ICD-10-CM | POA: Insufficient documentation

## 2018-03-07 NOTE — ED Triage Notes (Signed)
Pt to ED via POV c/o pain in her lower back and left leg that radiates into her groin area. Pt states that she has hx/o blood clot in that leg. Pt ambulatory to triage with a limp. Pt in NAD at this time.

## 2018-03-07 NOTE — ED Notes (Signed)
Patient transported to Ultrasound 

## 2018-03-08 ENCOUNTER — Emergency Department
Admission: EM | Admit: 2018-03-08 | Discharge: 2018-03-08 | Disposition: A | Discharge status: Home / Self Care | Payer: Medicare Other | Attending: Emergency Medicine | Admitting: Emergency Medicine

## 2018-03-08 ENCOUNTER — Encounter: Payer: Self-pay | Admitting: Radiology

## 2018-03-08 ENCOUNTER — Emergency Department: Payer: Medicare Other

## 2018-03-08 DIAGNOSIS — M545 Low back pain: Secondary | ICD-10-CM

## 2018-03-08 DIAGNOSIS — M79605 Pain in left leg: Secondary | ICD-10-CM

## 2018-03-08 LAB — URINALYSIS, COMPLETE (UACMP) WITH MICROSCOPIC
BACTERIA UA: NONE SEEN
BILIRUBIN URINE: NEGATIVE
Glucose, UA: NEGATIVE mg/dL
Hgb urine dipstick: NEGATIVE
KETONES UR: NEGATIVE mg/dL
NITRITE: NEGATIVE
Protein, ur: NEGATIVE mg/dL
Specific Gravity, Urine: 1.01 (ref 1.005–1.030)
pH: 5 (ref 5.0–8.0)

## 2018-03-08 LAB — CBC
HCT: 33.8 % — ABNORMAL LOW (ref 35.0–47.0)
HEMOGLOBIN: 11.8 g/dL — AB (ref 12.0–16.0)
MCH: 31.6 pg (ref 26.0–34.0)
MCHC: 34.9 g/dL (ref 32.0–36.0)
MCV: 90.6 fL (ref 80.0–100.0)
PLATELETS: 197 10*3/uL (ref 150–440)
RBC: 3.73 MIL/uL — ABNORMAL LOW (ref 3.80–5.20)
RDW: 12.8 % (ref 11.5–14.5)
WBC: 5.5 10*3/uL (ref 3.6–11.0)

## 2018-03-08 LAB — COMPREHENSIVE METABOLIC PANEL
ALBUMIN: 3.9 g/dL (ref 3.5–5.0)
ALK PHOS: 80 U/L (ref 38–126)
ALT: 22 U/L (ref 14–54)
ANION GAP: 9 (ref 5–15)
AST: 27 U/L (ref 15–41)
BUN: 30 mg/dL — ABNORMAL HIGH (ref 6–20)
CALCIUM: 9.4 mg/dL (ref 8.9–10.3)
CHLORIDE: 101 mmol/L (ref 101–111)
CO2: 27 mmol/L (ref 22–32)
Creatinine, Ser: 1.29 mg/dL — ABNORMAL HIGH (ref 0.44–1.00)
GFR calc Af Amer: 46 mL/min — ABNORMAL LOW (ref >60)
GFR calc non Af Amer: 40 mL/min — ABNORMAL LOW (ref >60)
GLUCOSE: 140 mg/dL — AB (ref 65–99)
POTASSIUM: 3.6 mmol/L (ref 3.5–5.1)
Sodium: 137 mmol/L (ref 135–145)
Total Bilirubin: 0.6 mg/dL (ref 0.3–1.2)
Total Protein: 7.1 g/dL (ref 6.5–8.1)

## 2018-03-08 MED ORDER — ACETAMINOPHEN 325 MG PO TABS
650.0000 mg | ORAL_TABLET | Freq: Once | ORAL | Status: AC
  Administered 2018-03-08: 650 mg via ORAL
  Filled 2018-03-08: qty 2

## 2018-03-08 MED ORDER — KETOROLAC TROMETHAMINE 30 MG/ML IJ SOLN
30.0000 mg | Freq: Once | INTRAMUSCULAR | Status: AC
  Administered 2018-03-08: 30 mg via INTRAVENOUS
  Filled 2018-03-08: qty 1

## 2018-03-08 MED ORDER — HYDROCODONE-ACETAMINOPHEN 5-325 MG PO TABS: 1.0000 | ORAL_TABLET | Freq: Four times a day (QID) | ORAL | 0 refills | Status: DC | PRN

## 2018-03-08 MED ORDER — IOPAMIDOL (ISOVUE-300) INJECTION 61%
75.0000 mL | Freq: Once | INTRAVENOUS | Status: AC | PRN
  Administered 2018-03-08: 75 mL via INTRAVENOUS

## 2018-03-08 NOTE — Discharge Instructions (Addendum)
Please follow-up with your primary care physician for further evaluation of your leg pain.

## 2018-03-08 NOTE — ED Provider Notes (Signed)
[facility] Emergency Department Provider Note   ____________________________________________   First MD Initiated Contact with Patient 03/08/18 [phone]     (approximate)  I have reviewed the triage vital signs and the nursing notes.   HISTORY  Chief Complaint Back Pain and Leg Pain    HPI Lindsey Shaw is a 77 y.o. female who comes into the hospital today with some pain shooting up her legs and into her groin as well as across her abdomen and back.  The patient states that the pain is on the left side of her leg.  She had a pelvic ultrasound on Monday and was diagnosed with a hernia.  She was told that she would need to follow-up with surgery but these pains started.  The patient has not been taking anything for pain at home.  The pain only comes every once in a while.  The patient's family is concerned about a urinary tract infection as well.  She has a history of DVT so she wanted to ensure that she was not having a clot in her leg.  The patient is also having swelling in her knees but states that she needs a knee replacement.  The patient denies any pain at this time.  When asked if the pain is worse when she stands on it or sits up she said it comes and goes and is hard to say which one.  The patient has had no nausea, no vomiting, no diarrhea, no chest pain.  The patient is here today for evaluation of her symptoms.   Past Medical History:  Diagnosis Date  . Anxiety   . Arthritis   . Blood clotting tendency ([facility])    2006   BLOOD CLOTT LEG TO GROIN  . Cancer ([place])    MELANOMA   . Chronic kidney disease    Stage 3 Kidney Disease  . Depression   . Diabetes mellitus without complication ([place])   . 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[employer]) 2006   Left leg  . GERD (gastroesophageal reflux disease)   . Gout   . Hyperlipidemia   . Hypertension   . Mental disorder   . Myocardial infarction ([place])    10+ YRS (Lexiscan: no evid of ischemia/infarct, EF 72%  06/23/12 [facility])    Patient Active Problem List   Diagnosis Date Noted  . Primary localized osteoarthritis of right knee 04/09/2017  . Abnormal EKG 01/22/2016  . History of chest pain 01/22/2016  . Uncontrolled type 2 diabetes mellitus without complication, without long-term current use of insulin ([place]) 01/22/2016  . Hyperlipidemia 01/22/2016  . Essential hypertension 01/22/2016  . Intractable pain 10/21/2015    Past Surgical History:  Procedure Laterality Date  . APPENDECTOMY  1967  . BACK SURGERY     2007  . CATARACT EXTRACTION W/PHACO Left 11/05/2016   Procedure: CATARACT EXTRACTION PHACO AND INTRAOCULAR LENS PLACEMENT (IOC);  Surgeon: Birder Robson, MD;  Location: [facility] ORS;  Service: Ophthalmology;  Laterality: Left;  Lot # [id number] H [place]: 01:16.4 AP%:24.0 CDE: 18.29  . CATARACT EXTRACTION W/PHACO Right 11/26/2016   Procedure: CATARACT EXTRACTION PHACO AND INTRAOCULAR LENS PLACEMENT (IOC);  Surgeon: Birder Robson, MD;  Location: [facility] ORS;  Service: Ophthalmology;  Laterality: Right;  [place] 48.1 AP% 15.4 CDE 7.36 Fluid pack lot # [id number]  . CHOLECYSTECTOMY  1967  . EYE SURGERY    . KYPHOPLASTY N/A 11/21/2015   Procedure: KYPHOPLASTY THORACIC 12;  Surgeon: Hessie Knows, MD;  Location: [facility] ORS;  Service:  Orthopedics;  Laterality: N/A;  . LUMBAR LAMINECTOMY/DECOMPRESSION MICRODISCECTOMY Right 06/28/2013   Procedure: LUMBAR LAMINECTOMY/DECOMPRESSION MICRODISCECTOMY 1 LEVEL;  Surgeon: Elaina Hoops, MD;  Location: [place] [facility];  Service: Neurosurgery;  Laterality: Right;  Lumbar Laminectomy Decompression Lumbar Four-Five Right  . TOE SURGERY Bilateral    BIL GREAT TOE JOINT REMOVED  . TOTAL KNEE ARTHROPLASTY Right 04/09/2017  . TOTAL KNEE ARTHROPLASTY Right 04/09/2017   Procedure: RIGHT TOTAL KNEE ARTHROPLASTY;  Surgeon: Ninetta Lights, MD;  Location: [facility];  Service: Orthopedics;  Laterality: Right;  . TUBAL LIGATION     73    Prior to Admission medications   Medication Sig  Start Date End Date Taking? Authorizing Provider  apixaban (ELIQUIS) 2.5 MG TABS tablet Take 1 tab po q12 hours x 30 days following surgery to prevent blood clots 04/09/17   Aundra Dubin, PA-C  atorvastatin (LIPITOR) 40 MG tablet Take 40 mg by mouth every evening. 08/01/16   [provider]  citalopram (CELEXA) 20 MG tablet Take 20 mg by mouth every evening.    [provider]  HYDROcodone-acetaminophen (NORCO) 5-325 MG tablet Take 1 tablet by mouth every 6 (six) hours as needed for moderate pain. 03/08/18   Loney Hering, MD  losartan-hydrochlorothiazide (HYZAAR) 100-25 MG per tablet Take 1 tablet by mouth at bedtime.     [provider]  metFORMIN (GLUCOPHAGE-XR) 500 MG 24 hr tablet Take 500 mg by mouth every evening. 09/11/16   [provider]  omeprazole (PRILOSEC) 20 MG capsule Take 20 mg by mouth every evening.    [provider]  ondansetron (ZOFRAN) 4 MG tablet Take 1 tablet (4 mg total) by mouth every 8 (eight) hours as needed for nausea or vomiting. 04/09/17   Aundra Dubin, PA-C  oxyCODONE-acetaminophen (PERCOCET) 5-325 MG tablet Take 1-2 tablets by mouth every 4 (four) hours as needed for severe pain. 04/09/17   Aundra Dubin, PA-C    Allergies Keflex [cephalexin]  Family History  Problem Relation Age of Onset  . Ovarian cancer Mother   . Pancreatic cancer Mother   . Lung cancer Father   . AAA (abdominal aortic aneurysm) Paternal Aunt   . Breast cancer Maternal Aunt        60's  . Breast cancer Maternal Uncle        60's  . Breast cancer Cousin        1 cousin-maternal side    Social History Social History   Tobacco Use  . Smoking status: Never Smoker  . Smokeless tobacco: Never Used  Substance Use Topics  . Alcohol use: No  . Drug use: No    Review of Systems  Constitutional: No fever/chills Eyes: No visual changes. ENT: No sore throat. Cardiovascular: Denies chest pain. Respiratory: Denies shortness of  breath. Gastrointestinal:  abdominal pain.  No nausea, no vomiting.  No diarrhea.  No constipation. Genitourinary: Negative for dysuria. Musculoskeletal: Left leg pain, groin pain, back pain Skin: Negative for rash. Neurological: Negative for headaches, focal weakness or numbness.   ____________________________________________   PHYSICAL EXAM:  VITAL SIGNS: ED Triage Vitals [03/07/18 1858]  Enc Vitals Group     BP (!) 136/101     Pulse Rate 74     Resp 16     Temp 98.8 F (37.1 C)     Temp Source Oral     SpO2 98 %     Weight 140 lb (63.5 kg)     Height 5'  5" (1.651 m)     Head Circumference      Peak Flow      Pain Score 5     Pain Loc      Pain Edu?      Excl. in [place]?     Constitutional: Alert and oriented. Well appearing and in mild distress. Eyes: Conjunctivae are normal. PERRL. EOMI. Head: Atraumatic. Nose: No congestion/rhinnorhea. Mouth/Throat: Mucous membranes are moist.  Oropharynx non-erythematous. Cardiovascular: Normal rate, regular rhythm. Grossly normal heart sounds.  Good peripheral circulation. Respiratory: Normal respiratory effort.  No retractions. Lungs CTAB. Gastrointestinal: Soft tenderness to palpation in the lower abdomen left greater than right.. No distention.  Positive bowel sounds Musculoskeletal: Mild pain to palpation in the left groin, no pain with passive range of motion, no pain with flexion or extension of left leg no pain to palpation of the leg. Neurologic:  Normal speech and language.  Skin:  Skin is warm, dry and intact.  Psychiatric: Mood and affect are normal.   ____________________________________________   LABS (all labs ordered are listed, but only abnormal results are displayed)  Labs Reviewed  CBC - Abnormal; Notable for the following components:      Result Value   RBC 3.73 (*)    Hemoglobin 11.8 (*)    HCT 33.8 (*)    All other components within normal limits  COMPREHENSIVE METABOLIC PANEL - Abnormal; Notable for  the following components:   Glucose, Bld 140 (*)    BUN 30 (*)    Creatinine, Ser 1.29 (*)    GFR calc non Af Amer 40 (*)    GFR calc Af Amer 46 (*)    All other components within normal limits  URINALYSIS, COMPLETE (UACMP) WITH MICROSCOPIC - Abnormal; Notable for the following components:   Color, Urine YELLOW (*)    APPearance CLEAR (*)    Leukocytes, UA SMALL (*)    Squamous Epithelial / LPF 0-5 (*)    All other components within normal limits   ____________________________________________  EKG  none ____________________________________________  RADIOLOGY  ED MD interpretation:  US venous left lower: No evidence of DVT  CT abd and pelvis: No acute finding in the abdomen or pelvis, moderate colonic stool burden.  Official radiology report(s): Ct Abdomen Pelvis W Contrast  Result Date: 03/08/2018 CLINICAL DATA:  Acute abdominal pain. Patient reports low back and left leg pain radiating into the groin. EXAM: CT ABDOMEN AND PELVIS WITH CONTRAST TECHNIQUE: Multidetector CT imaging of the abdomen and pelvis was performed using the standard protocol following bolus administration of intravenous contrast. CONTRAST:  28mL ISOVUE-300 IOPAMIDOL (ISOVUE-300) INJECTION 61% COMPARISON:  None. FINDINGS: Lower chest: No consolidation or pleural fluid. Hepatobiliary: No focal liver abnormality is seen. Status post cholecystectomy. No biliary dilatation. Pancreas: No ductal dilatation or inflammation. Spleen: Normal in size without focal abnormality. Splenule at the hilum. Adrenals/Urinary Tract: Normal adrenal glands. No hydronephrosis or perinephric edema. Homogeneous renal enhancement with symmetric excretion on delayed phase imaging. Bilobed cyst versus 2 adjacent cysts in the mid left kidney. Urinary bladder is physiologically distended without wall thickening. Stomach/Bowel: Stomach is nondistended. No small bowel obstruction, inflammation or wall thickening. Moderate stool in the colon. No  colonic wall thickening or inflammatory change. Appendix surgically absent per history. Vascular/Lymphatic: Aorta bi-iliac atherosclerosis. No aneurysm. No acute vascular finding. No enlarged abdominal or pelvic lymph nodes. Reproductive: 14 mm right ovarian cyst as characterized on recent pelvic ultrasound. Left ovary is normal. Uterus is normal in size. Other:  No free air, free fluid, or intra-abdominal fluid collection. Minimal fat in the right inguinal canal without frank hernia. Musculoskeletal: Prior T12 compression fracture with vertebral augmentation. Degenerative change of both hips, left greater than right. There are no acute or suspicious osseous abnormalities. IMPRESSION: 1. No acute finding in the abdomen or pelvis. 2. Moderate colonic stool burden, can be seen with constipation. 3.  Aortic Atherosclerosis (ICD10-I70.0). Electronically Signed   By: Jeb Levering M.D.   On: 03/08/2018 05:20   US Venous Img Lower Unilateral Left  Result Date: 03/07/2018 CLINICAL DATA:  Left leg pain EXAM: LEFT LOWER EXTREMITY VENOUS DOPPLER ULTRASOUND TECHNIQUE: Gray-scale sonography with graded compression, as well as color Doppler and duplex ultrasound were performed to evaluate the lower extremity deep venous systems from the level of the common femoral vein and including the common femoral, femoral, profunda femoral, popliteal and calf veins including the posterior tibial, peroneal and gastrocnemius veins when visible. The superficial great saphenous vein was also interrogated. Spectral Doppler was utilized to evaluate flow at rest and with distal augmentation maneuvers in the common femoral, femoral and popliteal veins. COMPARISON:  01/12/2018 FINDINGS: Contralateral Common Femoral Vein: Respiratory phasicity is normal and symmetric with the symptomatic side. No evidence of thrombus. Normal compressibility. Common Femoral Vein: No evidence of thrombus. Normal compressibility, respiratory phasicity and response  to augmentation. Saphenofemoral Junction: No evidence of thrombus. Normal compressibility and flow on color Doppler imaging. Profunda Femoral Vein: No evidence of thrombus. Normal compressibility and flow on color Doppler imaging. Femoral Vein: No evidence of thrombus. Normal compressibility, respiratory phasicity and response to augmentation. Popliteal Vein: No evidence of thrombus. Normal compressibility, respiratory phasicity and response to augmentation. Calf Veins: No evidence of thrombus. Normal compressibility and flow on color Doppler imaging. Superficial Great Saphenous Vein: No evidence of thrombus. Normal compressibility. Venous Reflux:  None. Other Findings:  None. IMPRESSION: No evidence of deep venous thrombosis. Electronically Signed   By: Inez [place] M.D.   On: 03/07/2018 19:57    ____________________________________________   PROCEDURES  Procedure(s) performed: None  Procedures  Critical Care performed: No  ____________________________________________   INITIAL IMPRESSION / ASSESSMENT AND PLAN / ED COURSE  As part of my medical decision making, I reviewed the following data within the electronic MEDICAL RECORD NUMBER Notes from prior ED visits and [place] Controlled Substance Database   This is a 77 year old female who comes into the hospital today with some left leg pain going across her back and her abdomen.  My differential diagnosis includes DVT, urinary tract infection, diverticulitis, musculoskeletal pain.  The patient did initially receive an ultrasound of her left lower extremity which is negative.  After examining the patient I did order a CBC, CMP, urinalysis and a CT scan of her abdomen.  The patient's blood work is unremarkable but I am waiting for the results of the CT scan.  I will give the patient a dose of Tylenol and she will be reassessed.     The patient is comfortable at this time.  The CT scan and blood work is unremarkable.  I am unsure of the cause of the  patient's pain but I will give her a shot of Toradol as well as some Tylenol and she will be discharged home to follow-up with her primary care physician.  The patient has no further questions or concerns at this time. ____________________________________________   FINAL CLINICAL IMPRESSION(S) / ED DIAGNOSES  Final diagnoses:  Acute left-sided low back pain, with sciatica presence unspecified  Left leg pain     ED Discharge Orders        Ordered    HYDROcodone-acetaminophen (NORCO) 5-325 MG tablet  Every 6 hours PRN     03/08/18 0547       Note:  This document was prepared using Dragon voice recognition software and may include unintentional dictation errors.    Loney Hering, MD 03/08/18 [phone]

## 2018-03-24 ENCOUNTER — Encounter: Payer: Self-pay | Admitting: General Surgery

## 2018-03-24 ENCOUNTER — Ambulatory Visit (INDEPENDENT_AMBULATORY_CARE_PROVIDER_SITE_OTHER): Payer: Medicare Other | Admitting: General Surgery

## 2018-03-24 VITALS — BP 126/70 | HR 73 | Resp 16 | Ht 65.0 in | Wt 144.0 lb

## 2018-03-24 DIAGNOSIS — K4090 Unilateral inguinal hernia, without obstruction or gangrene, not specified as recurrent: Secondary | ICD-10-CM | POA: Diagnosis not present

## 2018-03-24 NOTE — Patient Instructions (Addendum)
The patient is aware to call back for any questions or concerns.  Inguinal Hernia, Adult An inguinal hernia is when fat or the intestines push through the area where the leg meets the lower belly (groin) and make a rounded lump (bulge). This condition happens over time. There are three types of inguinal hernias. These types include:  Hernias that can be pushed back into the belly (are reducible).  Hernias that cannot be pushed back into the belly (are incarcerated).  Hernias that cannot be pushed back into the belly and lose their blood supply (get strangulated). This type needs emergency surgery.  Follow these instructions at home: Lifestyle  Drink enough fluid to keep your urine (pee) clear or pale yellow.  Eat plenty of fruits, vegetables, and whole grains. These have a lot of fiber. Talk with your doctor if you have questions.  Avoid lifting heavy objects.  Avoid standing for long periods of time.  Do not use tobacco products. These include cigarettes, chewing tobacco, or e-cigarettes. If you need help quitting, ask your doctor.  Try to stay at a healthy weight. General instructions  Do not try to force the hernia back in.  Watch your hernia for any changes in color or size. Let your doctor know if there are any changes.  Take over-the-counter and prescription medicines only as told by your doctor.  Keep all follow-up visits as told by your doctor. This is important. Contact a doctor if:  You have a fever.  You have new symptoms.  Your symptoms get worse. Get help right away if:  The area where the legs meets the lower belly has: ? Pain that gets worse suddenly. ? A bulge that gets bigger suddenly and does not go down. ? A bulge that turns red or purple. ? A bulge that is painful to the touch.  You are a man and your scrotum: ? Suddenly feels painful. ? Suddenly changes in size.  You feel sick to your stomach (nauseous) and this feeling does not go  away.  You throw up (vomit) and this keeps happening.  You feel your heart beating a lot more quickly than normal.  You cannot poop (have a bowel movement) or pass gas. This information is not intended to replace advice given to you by your health care provider. Make sure you discuss any questions you have with your health care provider. Document Released: 01/02/2007 Document Revised: 05/09/2016 Document Reviewed: 10/12/2014 Elsevier Interactive Patient Education  [employer].  The patient is scheduled for surgery at [facility] on 04/06/18. She will pre admit at the hospital on 03/27/18 at 8:15 am. The patient is aware of dates, time, and instructions.

## 2018-03-24 NOTE — Progress Notes (Signed)
Patient ID: Lindsey Shaw, female   DOB: [birth year], 77 y.o.   MRN: [id number]  Chief Complaint  Patient presents with  . Mass    HPI Lindsey Shaw is a 77 y.o. female.  Here for evaluation of a knot in the right groin area possible hernia referred by Boykin Reaper PA. She states it has been there for several months. She states it has gotten larger over the last month. She did help her daughter move 02-02-18 and that is when she noticed it. She states if she is up standing a lot then the lower abdomen hurts worse. She does admit to urinary frequency for about a month. CT abdomen was 03-08-18 when she went to the for left side pain. She does take pain medication on occasion for left sciatic nerve pain.  She is retired from the [employer].  HPI  Past Medical History:  Diagnosis Date  . Anxiety   . Arthritis   . Blood clotting tendency ([place])    2006   BLOOD CLOT LEG TO GROIN/ post fall  . Cancer ([place]) 2005, 2010   MELANOMA   . Chronic kidney disease    Stage 3 Kidney Disease  . Depression   . Diabetes mellitus without complication ([place])   . 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[place]) 2006   Left leg  . GERD (gastroesophageal reflux disease)   . Gout   . Hyperlipidemia   . Hypertension   . Mental disorder   . Myocardial infarction ([place])    10+ YRS (Lexiscan: no evid of ischemia/infarct, EF 72% 06/23/12 [facility])    Past Surgical History:  Procedure Laterality Date  . APPENDECTOMY  1967  . BACK SURGERY     2007  . CATARACT EXTRACTION W/PHACO Left 11/05/2016   Procedure: CATARACT EXTRACTION PHACO AND INTRAOCULAR LENS PLACEMENT (IOC);  Surgeon: Birder Robson, MD;  Location: [facility] ORS;  Service: Ophthalmology;  Laterality: Left;  Lot # [id number] H [place]: 01:16.4 AP%:24.0 CDE: 18.29  . CATARACT EXTRACTION W/PHACO Right 11/26/2016   Procedure: CATARACT EXTRACTION PHACO AND INTRAOCULAR LENS PLACEMENT (IOC);  Surgeon: Birder Robson, MD;  Location: [facility] ORS;  Service: Ophthalmology;   Laterality: Right;  [place] 48.1 AP% 15.4 CDE 7.36 Fluid pack lot # [id number]  . CHOLECYSTECTOMY  1967  . EYE SURGERY    . KYPHOPLASTY N/A 11/21/2015   Procedure: KYPHOPLASTY THORACIC 12;  Surgeon: Hessie Knows, MD;  Location: [facility] ORS;  Service: Orthopedics;  Laterality: N/A;  . LUMBAR LAMINECTOMY/DECOMPRESSION MICRODISCECTOMY Right 06/28/2013   Procedure: LUMBAR LAMINECTOMY/DECOMPRESSION MICRODISCECTOMY 1 LEVEL;  Surgeon: Elaina Hoops, MD;  Location: [place] [facility];  Service: Neurosurgery;  Laterality: Right;  Lumbar Laminectomy Decompression Lumbar Four-Five Right  . TOE SURGERY Bilateral    BIL GREAT TOE JOINT REMOVED  . TOTAL KNEE ARTHROPLASTY Right 04/09/2017  . TOTAL KNEE ARTHROPLASTY Right 04/09/2017   Procedure: RIGHT TOTAL KNEE ARTHROPLASTY;  Surgeon: Ninetta Lights, MD;  Location: [place];  Service: Orthopedics;  Laterality: Right;  . TUBAL LIGATION     50    Family History  Problem Relation Age of Onset  . Ovarian cancer Mother   . Pancreatic cancer Mother   . Lung cancer Father   . AAA (abdominal aortic aneurysm) Paternal Aunt   . Breast cancer Maternal Aunt        60's  . Breast cancer Maternal Uncle        60's  . Breast cancer Cousin        1 cousin-maternal  side    Social History Social History   Tobacco Use  . Smoking status: Never Smoker  . Smokeless tobacco: Never Used  Substance Use Topics  . Alcohol use: No  . Drug use: No    Allergies  Allergen Reactions  . Keflex [Cephalexin] Swelling    SWELLING REACTION UNSPECIFIED     Current Outpatient Medications  Medication Sig Dispense Refill  . atorvastatin (LIPITOR) 40 MG tablet Take 40 mg by mouth every evening.    . citalopram (CELEXA) 20 MG tablet Take 20 mg by mouth every evening.    . metFORMIN (GLUCOPHAGE-XR) 500 MG 24 hr tablet Take 500 mg by mouth every evening.    Marland Kitchen omeprazole (PRILOSEC) 20 MG capsule Take 20 mg by mouth every evening.    Marland Kitchen oxyCODONE-acetaminophen (PERCOCET) 5-325 MG tablet Take 1-2  tablets by mouth every 4 (four) hours as needed for severe pain. (Patient taking differently: Take 0.5-1 tablets by mouth every 4 (four) hours as needed for severe pain. ) 60 tablet 0  . acetaminophen (TYLENOL) 500 MG tablet Take 500-1,000 mg by mouth every 6 (six) hours as needed (for pain.).    Marland Kitchen aspirin EC 81 MG tablet Take 81 mg by mouth every evening.    . Febuxostat (ULORIC) 80 MG TABS Take 80 mg by mouth every evening.    Marland Kitchen losartan (COZAAR) 100 MG tablet Take 100 mg by mouth every evening.      No current facility-administered medications for this visit.    Facility-Administered Medications Ordered in Other Visits  Medication Dose Route Frequency Provider Last Rate Last Dose  . fentaNYL (SUBLIMAZE) injection 25 mcg  25 mcg Intravenous Q5 min PRN Alvin Critchley, MD      . ondansetron [facility]) injection 4 mg  4 mg Intravenous Once PRN Alvin Critchley, MD        Review of Systems Review of Systems  Constitutional: Negative.   Respiratory: Negative.   Cardiovascular: Negative.   Gastrointestinal: Positive for abdominal pain.    Blood pressure 126/70, pulse 73, resp. rate 16, height 5\' 5"  (1.651 m), weight 144 lb (65.3 kg), SpO2 98 %.  Physical Exam Physical Exam  Constitutional: She is oriented to person, place, and time. She appears well-developed and well-nourished.  HENT:  Mouth/Throat: Oropharynx is clear and moist.  Eyes: Conjunctivae are normal. No scleral icterus.  Neck: Neck supple.  Cardiovascular: Normal rate and regular rhythm.  Murmur heard.  Systolic murmur is present with a grade of 1/6. Pulses:      Femoral pulses are 2+ on the right side, and 2+ on the left side. No lower leg edema  Pulmonary/Chest: Effort normal and breath sounds normal.  Abdominal: Soft. Normal appearance and bowel sounds are normal. There is no tenderness. A hernia is present. Hernia confirmed positive in the right inguinal area.    Musculoskeletal:       Feet:  Lymphadenopathy:    She  has no cervical adenopathy.       Right: No inguinal adenopathy present.       Left: No inguinal adenopathy present.  Neurological: She is alert and oriented to person, place, and time.  Skin: Skin is warm and dry.  Right lateral ankle nodule  Psychiatric: Her behavior is normal.    Data Reviewed CT of the abdomen and pelvis dated March 08, 2018 notable for a 14 mm right ovarian cyst, fat within the right inguinal canal without frank hernia.  T12 compression fracture.  Left greater  than right degenerative changes of the hips.  Laboratory studies of March 08, 2018 were reviewed.  Comprehensive metabolic panel showed an elevated nonfasting blood sugar of 140.  Creatinine of 1.29 with an estimated GFR of 40.  Normal liver function studies.  Normal electrolytes.  Hemoglobin 11.8 with an MCV of 90.6, white blood cell count of 5500, platelet count of 197,000.  Hemoglobin improved from April 2018 when she was recovering from a right total knee, hemoglobin at that time 8.5.  Urinalysis showed 0-5 squamous cells, small leukocytes, negative bacteria, negative nitrite.  Assessment    Symptomatic right inguinal hernia.    Plan    Reviewed the options for laparoscopic versus open repair.  Pros and cons of each reviewed.  Patient is interested in laparoscopic repair.  Possibility of open repair if laparoscopic repair was not possible was reviewed and acceptable to the patient.  Hernia precautions and incarceration were discussed with the patient. If they develop symptoms of an incarcerated hernia, they were encouraged to seek prompt medical attention.  I have recommended repair of the hernia using mesh on an outpatient basis in the near future. The risk of infection was reviewed. The role of prosthetic mesh to minimize the risk of recurrence was reviewed.    HPI, Physical Exam, Assessment and Plan have been scribed under the direction and in the presence of Robert Bellow, MD. Karie Fetch,  RN  I have completed the exam and reviewed the above documentation for accuracy and completeness.  I agree with the above.  Haematologist has been used and any errors in dictation or transcription are unintentional.  Hervey Ard, M.D., F.A.C.S.  The patient is scheduled for surgery at [facility] on [facility]06[facility]18. She will pre admit at the hospital on 03/27/18 at 8:15 am. The patient is aware of dates, time, and instructions.  Documented by Caryl-Lyn Otis Brace LPN  Lindsey Shaw 03/25/2018, 5:36 PM

## 2018-03-25 DIAGNOSIS — K4090 Unilateral inguinal hernia, without obstruction or gangrene, not specified as recurrent: Secondary | ICD-10-CM | POA: Insufficient documentation

## 2018-03-27 ENCOUNTER — Inpatient Hospital Stay: Admission: RE | Admit: 2018-03-27 | Payer: Medicare Other | Source: Ambulatory Visit

## 2018-03-30 ENCOUNTER — Other Ambulatory Visit: Payer: Self-pay

## 2018-03-30 ENCOUNTER — Encounter
Admission: RE | Admit: 2018-03-30 | Discharge: 2018-03-30 | Disposition: A | Discharge status: Home / Self Care | Payer: Medicare Other | Source: Ambulatory Visit | Attending: General Surgery | Admitting: General Surgery

## 2018-03-30 DIAGNOSIS — Z01810 Encounter for preprocedural cardiovascular examination: Secondary | ICD-10-CM | POA: Insufficient documentation

## 2018-03-30 DIAGNOSIS — E119 Type 2 diabetes mellitus without complications: Secondary | ICD-10-CM | POA: Diagnosis not present

## 2018-03-30 DIAGNOSIS — I10 Essential (primary) hypertension: Secondary | ICD-10-CM | POA: Insufficient documentation

## 2018-03-30 DIAGNOSIS — Z01812 Encounter for preprocedural laboratory examination: Secondary | ICD-10-CM | POA: Insufficient documentation

## 2018-03-30 DIAGNOSIS — R001 Bradycardia, unspecified: Secondary | ICD-10-CM | POA: Insufficient documentation

## 2018-03-30 HISTORY — DX: Acute embolism and thrombosis of unspecified deep veins of unspecified lower extremity: I82.409

## 2018-03-30 LAB — PROTIME-INR
INR: 0.96
Prothrombin Time: 12.7 seconds (ref 11.4–15.2)

## 2018-03-30 LAB — APTT: APTT: 31 s (ref 24–36)

## 2018-03-30 LAB — SURGICAL PCR SCREEN
MRSA, PCR: NEGATIVE
Staphylococcus aureus: NEGATIVE

## 2018-03-30 NOTE — Patient Instructions (Signed)
Your procedure is scheduled on: Monday, April 06, 2018 Report to Day Surgery on the 2nd floor of the [facility]. To find out your arrival time, please call [phone] between 1PM - 3PM on: Friday, April 03, 2018  REMEMBER: Instructions that are not followed completely may result in serious medical risk, up to and including death; or upon the discretion of your surgeon and anesthesiologist your surgery may need to be rescheduled.  Do not eat food after midnight the night before your procedure.  No gum chewing, lozengers or hard candies.  You may however, drink water up to 2 hours before you are scheduled to arrive for your surgery. Do not drink anything within 2 hours of the start of your surgery.  No Alcohol for 24 hours before or after surgery.  No Smoking including e-cigarettes for 24 hours prior to surgery.  No chewable tobacco products for at least 6 hours prior to surgery.  No nicotine patches on the day of surgery.  On the morning of surgery brush your teeth with toothpaste and water, you may rinse your mouth with mouthwash if you wish. Do not swallow any toothpaste or mouthwash.  Notify your doctor if there is any change in your medical condition (cold, fever, infection).  Do not wear jewelry, make-up, hairpins, clips or nail polish.  Do not wear lotions, powders, or perfumes. You may wear deodorant.  Do not shave 48 hours prior to surgery. Men may shave face and neck.  Contacts and dentures may not be worn into surgery.  Do not bring valuables to the hospital, including drivers license, insurance or credit cards.  [place] is not responsible for any belongings or valuables.   TAKE THESE MEDICATIONS THE MORNING OF SURGERY:  1.  OMEPRAZOLE (take one the night before surgery and one the morning of surgery - helps to prevent nausea)  Use CHG Soap as directed on instruction sheet.  Stop Metformin 2 days prior to surgery. Last day to take is Friday, April 19. Resume  after surgery.  NOW!  Stop aspirin and  Anti-inflammatories (NSAIDS) such as Advil, Aleve, Ibuprofen, Motrin, Naproxen, Naprosyn and Aspirin based products such as Excedrin, Goodys Powder, BC Powder. (May take Tylenol or Acetaminophen if needed.)  NOW!  Stop ANY OVER THE COUNTER supplements until after surgery.  Wear comfortable clothing (specific to your surgery type) to the hospital.  Plan for stool softeners for home use.  If you are being discharged the day of surgery, you will not be allowed to drive home. You will need a responsible adult to drive you home and stay with you that night.   If you are taking public transportation, you will need to have a responsible adult with you. Please confirm with your physician that it is acceptable to use public transportation.   Please call [phone] if you have any questions about these instructions.

## 2018-03-31 NOTE — Pre-Procedure Instructions (Signed)
EKG COMPARED WITH OLD 

## 2018-04-01 NOTE — Care Management (Signed)
EKG reviewed. Poor R wave progression seen. Comparing old EKGs, she had similar poor R wave progressions. Will proceed, no further consults needed.

## 2018-04-05 MED ORDER — CLINDAMYCIN PHOSPHATE 900 MG/50ML IV SOLN: 900.0000 mg | INTRAVENOUS | Status: AC

## 2018-04-06 ENCOUNTER — Encounter: Payer: Self-pay | Admitting: *Deleted

## 2018-04-06 ENCOUNTER — Ambulatory Visit: Payer: Medicare Other | Admitting: Anesthesiology

## 2018-04-06 ENCOUNTER — Encounter
Admission: RE | Disposition: A | Discharge status: Home / Self Care | Payer: Self-pay | Source: Ambulatory Visit | Attending: General Surgery

## 2018-04-06 ENCOUNTER — Other Ambulatory Visit: Payer: Self-pay

## 2018-04-06 ENCOUNTER — Ambulatory Visit
Admission: RE | Admit: 2018-04-06 | Discharge: 2018-04-06 | Disposition: A | Discharge status: Home / Self Care | Payer: Medicare Other | Source: Ambulatory Visit | Attending: General Surgery | Admitting: General Surgery

## 2018-04-06 DIAGNOSIS — M109 Gout, unspecified: Secondary | ICD-10-CM | POA: Diagnosis not present

## 2018-04-06 DIAGNOSIS — K4090 Unilateral inguinal hernia, without obstruction or gangrene, not specified as recurrent: Secondary | ICD-10-CM | POA: Insufficient documentation

## 2018-04-06 DIAGNOSIS — Z800 Family history of malignant neoplasm of digestive organs: Secondary | ICD-10-CM | POA: Diagnosis not present

## 2018-04-06 DIAGNOSIS — I252 Old myocardial infarction: Secondary | ICD-10-CM | POA: Diagnosis not present

## 2018-04-06 DIAGNOSIS — Z8249 Family history of ischemic heart disease and other diseases of the circulatory system: Secondary | ICD-10-CM | POA: Diagnosis not present

## 2018-04-06 DIAGNOSIS — Z801 Family history of malignant neoplasm of trachea, bronchus and lung: Secondary | ICD-10-CM | POA: Insufficient documentation

## 2018-04-06 DIAGNOSIS — Z96651 Presence of right artificial knee joint: Secondary | ICD-10-CM | POA: Diagnosis not present

## 2018-04-06 DIAGNOSIS — N183 Chronic kidney disease, stage 3 (moderate): Secondary | ICD-10-CM | POA: Diagnosis not present

## 2018-04-06 DIAGNOSIS — I129 Hypertensive chronic kidney disease with stage 1 through stage 4 chronic kidney disease, or unspecified chronic kidney disease: Secondary | ICD-10-CM | POA: Diagnosis not present

## 2018-04-06 DIAGNOSIS — Z86718 Personal history of other venous thrombosis and embolism: Secondary | ICD-10-CM | POA: Insufficient documentation

## 2018-04-06 DIAGNOSIS — Z8041 Family history of malignant neoplasm of ovary: Secondary | ICD-10-CM | POA: Diagnosis not present

## 2018-04-06 DIAGNOSIS — Z85820 Personal history of malignant melanoma of skin: Secondary | ICD-10-CM | POA: Diagnosis not present

## 2018-04-06 DIAGNOSIS — Z7984 Long term (current) use of oral hypoglycemic drugs: Secondary | ICD-10-CM | POA: Insufficient documentation

## 2018-04-06 DIAGNOSIS — Z9049 Acquired absence of other specified parts of digestive tract: Secondary | ICD-10-CM | POA: Diagnosis not present

## 2018-04-06 DIAGNOSIS — Z7982 Long term (current) use of aspirin: Secondary | ICD-10-CM | POA: Insufficient documentation

## 2018-04-06 DIAGNOSIS — F418 Other specified anxiety disorders: Secondary | ICD-10-CM | POA: Diagnosis not present

## 2018-04-06 DIAGNOSIS — Z961 Presence of intraocular lens: Secondary | ICD-10-CM | POA: Insufficient documentation

## 2018-04-06 DIAGNOSIS — D689 Coagulation defect, unspecified: Secondary | ICD-10-CM | POA: Diagnosis not present

## 2018-04-06 DIAGNOSIS — E1122 Type 2 diabetes mellitus with diabetic chronic kidney disease: Secondary | ICD-10-CM | POA: Diagnosis not present

## 2018-04-06 DIAGNOSIS — E785 Hyperlipidemia, unspecified: Secondary | ICD-10-CM | POA: Insufficient documentation

## 2018-04-06 DIAGNOSIS — Z9841 Cataract extraction status, right eye: Secondary | ICD-10-CM | POA: Insufficient documentation

## 2018-04-06 DIAGNOSIS — Z9842 Cataract extraction status, left eye: Secondary | ICD-10-CM | POA: Diagnosis not present

## 2018-04-06 DIAGNOSIS — K219 Gastro-esophageal reflux disease without esophagitis: Secondary | ICD-10-CM | POA: Diagnosis not present

## 2018-04-06 DIAGNOSIS — I10 Essential (primary) hypertension: Secondary | ICD-10-CM | POA: Insufficient documentation

## 2018-04-06 DIAGNOSIS — Z79899 Other long term (current) drug therapy: Secondary | ICD-10-CM | POA: Insufficient documentation

## 2018-04-06 DIAGNOSIS — Z803 Family history of malignant neoplasm of breast: Secondary | ICD-10-CM | POA: Diagnosis not present

## 2018-04-06 HISTORY — PX: INGUINAL HERNIA REPAIR: SHX194

## 2018-04-06 LAB — GLUCOSE, CAPILLARY
GLUCOSE-CAPILLARY: 111 mg/dL — AB (ref 65–99)
Glucose-Capillary: 124 mg/dL — ABNORMAL HIGH (ref 65–99)

## 2018-04-06 SURGERY — REPAIR, HERNIA, INGUINAL, LAPAROSCOPIC
Anesthesia: General | Laterality: Right | Wound class: "Clean "

## 2018-04-06 MED ORDER — ROCURONIUM BROMIDE 100 MG/10ML IV SOLN
INTRAVENOUS | Status: DC | PRN
  Administered 2018-04-06: 30 mg via INTRAVENOUS
  Administered 2018-04-06: 20 mg via INTRAVENOUS

## 2018-04-06 MED ORDER — ONDANSETRON HCL 4 MG/2ML IJ SOLN
INTRAMUSCULAR | Status: DC | PRN
  Administered 2018-04-06: 4 mg via INTRAVENOUS

## 2018-04-06 MED ORDER — HYDROCODONE-ACETAMINOPHEN 5-325 MG PO TABS
1.0000 | ORAL_TABLET | ORAL | Status: DC | PRN
  Administered 2018-04-06: 1 via ORAL

## 2018-04-06 MED ORDER — ONDANSETRON HCL 4 MG/2ML IJ SOLN: 4.0000 mg | Freq: Once | INTRAMUSCULAR | Status: DC | PRN

## 2018-04-06 MED ORDER — FENTANYL CITRATE (PF) 100 MCG/2ML IJ SOLN
INTRAMUSCULAR | Status: AC
  Administered 2018-04-06: 25 ug via INTRAVENOUS
  Filled 2018-04-06: qty 2

## 2018-04-06 MED ORDER — GLYCOPYRROLATE 0.2 MG/ML IJ SOLN
INTRAMUSCULAR | Status: DC | PRN
  Administered 2018-04-06: 0.2 mg via INTRAVENOUS

## 2018-04-06 MED ORDER — EPHEDRINE SULFATE 50 MG/ML IJ SOLN
INTRAMUSCULAR | Status: AC
  Filled 2018-04-06: qty 1

## 2018-04-06 MED ORDER — MIDAZOLAM HCL 2 MG/2ML IJ SOLN
INTRAMUSCULAR | Status: DC | PRN
  Administered 2018-04-06: 1 mg via INTRAVENOUS

## 2018-04-06 MED ORDER — LACTATED RINGERS IV SOLN
INTRAVENOUS | Status: DC | PRN
  Administered 2018-04-06 (×2): via INTRAVENOUS

## 2018-04-06 MED ORDER — SUCCINYLCHOLINE CHLORIDE 20 MG/ML IJ SOLN
INTRAMUSCULAR | Status: AC
  Filled 2018-04-06: qty 1

## 2018-04-06 MED ORDER — SUGAMMADEX SODIUM 500 MG/5ML IV SOLN
INTRAVENOUS | Status: DC | PRN
  Administered 2018-04-06: 261.2 mg via INTRAVENOUS

## 2018-04-06 MED ORDER — SODIUM CHLORIDE 0.9 % IV SOLN
INTRAVENOUS | Status: DC
  Administered 2018-04-06: 10:00:00 via INTRAVENOUS

## 2018-04-06 MED ORDER — CLINDAMYCIN PHOSPHATE 900 MG/50ML IV SOLN
INTRAVENOUS | Status: DC | PRN
  Administered 2018-04-06: 900 mg via INTRAVENOUS

## 2018-04-06 MED ORDER — LIDOCAINE HCL (PF) 2 % IJ SOLN
INTRAMUSCULAR | Status: AC
  Filled 2018-04-06: qty 10

## 2018-04-06 MED ORDER — CLINDAMYCIN PHOSPHATE 900 MG/50ML IV SOLN
INTRAVENOUS | Status: AC
  Filled 2018-04-06: qty 50

## 2018-04-06 MED ORDER — ONDANSETRON HCL 4 MG/2ML IJ SOLN
INTRAMUSCULAR | Status: AC
  Filled 2018-04-06: qty 2

## 2018-04-06 MED ORDER — PROPOFOL 10 MG/ML IV BOLUS
INTRAVENOUS | Status: DC | PRN
  Administered 2018-04-06: 100 mg via INTRAVENOUS

## 2018-04-06 MED ORDER — ROCURONIUM BROMIDE 50 MG/5ML IV SOLN
INTRAVENOUS | Status: AC
  Filled 2018-04-06: qty 1

## 2018-04-06 MED ORDER — DEXAMETHASONE SODIUM PHOSPHATE 10 MG/ML IJ SOLN
INTRAMUSCULAR | Status: AC
  Filled 2018-04-06: qty 1

## 2018-04-06 MED ORDER — MIDAZOLAM HCL 2 MG/2ML IJ SOLN
INTRAMUSCULAR | Status: AC
  Filled 2018-04-06: qty 2

## 2018-04-06 MED ORDER — HYDROCODONE-ACETAMINOPHEN 5-325 MG PO TABS
ORAL_TABLET | ORAL | Status: AC
  Filled 2018-04-06: qty 1

## 2018-04-06 MED ORDER — ACETAMINOPHEN 10 MG/ML IV SOLN
INTRAVENOUS | Status: AC
  Filled 2018-04-06: qty 100

## 2018-04-06 MED ORDER — LIDOCAINE HCL (CARDIAC) PF 100 MG/5ML IV SOSY
PREFILLED_SYRINGE | INTRAVENOUS | Status: DC | PRN
  Administered 2018-04-06: 80 mg via INTRAVENOUS

## 2018-04-06 MED ORDER — PROPOFOL 10 MG/ML IV BOLUS
INTRAVENOUS | Status: AC
Start: 2018-04-06 — End: ?
  Filled 2018-04-06: qty 20

## 2018-04-06 MED ORDER — SEVOFLURANE IN SOLN
RESPIRATORY_TRACT | Status: AC
  Filled 2018-04-06: qty 250

## 2018-04-06 MED ORDER — FENTANYL CITRATE (PF) 100 MCG/2ML IJ SOLN
25.0000 ug | INTRAMUSCULAR | Status: DC | PRN
  Administered 2018-04-06 (×2): 25 ug via INTRAVENOUS

## 2018-04-06 MED ORDER — ACETAMINOPHEN 10 MG/ML IV SOLN
INTRAVENOUS | Status: DC | PRN
  Administered 2018-04-06: 1000 mg via INTRAVENOUS

## 2018-04-06 MED ORDER — DEXAMETHASONE SODIUM PHOSPHATE 10 MG/ML IJ SOLN
INTRAMUSCULAR | Status: DC | PRN
  Administered 2018-04-06: 10 mg via INTRAVENOUS

## 2018-04-06 MED ORDER — KETOROLAC TROMETHAMINE 30 MG/ML IJ SOLN
INTRAMUSCULAR | Status: DC | PRN
  Administered 2018-04-06: 30 mg via INTRAVENOUS

## 2018-04-06 MED ORDER — HYDROCODONE-ACETAMINOPHEN 5-325 MG PO TABS: 1.0000 | ORAL_TABLET | ORAL | 0 refills | Status: DC | PRN

## 2018-04-06 MED ORDER — FENTANYL CITRATE (PF) 100 MCG/2ML IJ SOLN
INTRAMUSCULAR | Status: AC
  Filled 2018-04-06: qty 2

## 2018-04-06 SURGICAL SUPPLY — 29 items
BLADE SURG 11 STRL SS SAFETY (MISCELLANEOUS) ×2 IMPLANT
CANNULA DILATOR 10 W/SLV (CANNULA) ×2 IMPLANT
CANNULA DILATOR 5 W/SLV (CANNULA) ×4 IMPLANT
CHLORAPREP W/TINT 26ML (MISCELLANEOUS) ×2 IMPLANT
DEVICE SECURE STRAP 25 ABSORB (INSTRUMENTS) ×2 IMPLANT
DISSECTOR KITTNER STICK (MISCELLANEOUS) ×1 IMPLANT
DISSECTORS/KITTNER STICK (MISCELLANEOUS) ×2
DRSG TEGADERM 2-3/8X2-3/4 SM (GAUZE/BANDAGES/DRESSINGS) ×6 IMPLANT
ELECT REM PT RETURN 9FT ADLT (ELECTROSURGICAL) ×2
ELECTRODE REM PT RTRN 9FT ADLT (ELECTROSURGICAL) ×1 IMPLANT
GLOVE BIO SURGEON STRL SZ7.5 (GLOVE) ×2 IMPLANT
GLOVE INDICATOR 8.0 STRL GRN (GLOVE) ×2 IMPLANT
GOWN STRL REUS W/ TWL LRG LVL3 (GOWN DISPOSABLE) ×2 IMPLANT
GOWN STRL REUS W/TWL LRG LVL3 (GOWN DISPOSABLE) ×2
GRASPER SUT TROCAR 14GX15 (MISCELLANEOUS) ×2 IMPLANT
KIT TURNOVER KIT A (KITS) ×2 IMPLANT
LABEL OR SOLS (LABEL) ×2 IMPLANT
MESH 3DMAX 4X6 RT LRG (Mesh General) ×1 IMPLANT
NS IRRIG 500ML POUR BTL (IV SOLUTION) ×2 IMPLANT
PACK LAP CHOLECYSTECTOMY (MISCELLANEOUS) ×2 IMPLANT
SCISSORS METZENBAUM CVD 33 (INSTRUMENTS) ×2 IMPLANT
STRIP CLOSURE SKIN 1/2X4 (GAUZE/BANDAGES/DRESSINGS) ×2 IMPLANT
SUT VIC AB 0 SH 27 (SUTURE) ×2 IMPLANT
SUT VIC AB 4-0 FS2 27 (SUTURE) ×2 IMPLANT
SWABSTK COMLB BENZOIN TINCTURE (MISCELLANEOUS) ×2 IMPLANT
TACKER 5MM HERNIA 3.5CML NAB (ENDOMECHANICALS) ×1 IMPLANT
TRAY FOLEY W/METER SILVER 16FR (SET/KITS/TRAYS/PACK) ×2 IMPLANT
TUBING INSUFFLATION (TUBING) ×2 IMPLANT
WATER STERILE IRR 1000ML POUR (IV SOLUTION) ×2 IMPLANT

## 2018-04-06 NOTE — Op Note (Signed)
Preoperative diagnosis: Right inguinal hernia.  Postoperative diagnosis: Same, direct.  Operative procedure: Laparoscopic right inguinal hernia repair.  Operating Surgeon: Hervey Ard, MD.  Anesthesia: General endotracheal.  Estimated blood loss: Less than 5 cc.  Clinical note: This 77 year old woman is developed a symptomatic right inguinal hernia.  She requested laparoscopic repair.  Operative note.  The patient received clindamycin prior to the procedure due to her Kefzol allergy.  She underwent general endotracheal anesthesia without difficulty.  SCD stockings for DVT prevention.  After induction of general endotracheal anesthesia Foley catheter was placed by the nurse.  The abdomen was cleansed with ChloraPrep and draped.  In Trendelenburg position a varies needle was placed through a trans-umbilical incision.  After assuring intra-abdominal location with a hanging drop test the abdomen was insufflated with CO2 at 10 mmHg pressure.  A 10 mm Step port was placed inspection showed no evidence of injury from initial port placement.  2-5 mm lateral ports were then placed under direct vision.  (Step).  The defect appeared to be a generous sized direct defect.  The peritoneum about 2 cm above the defect was opened with cautery shears.  The inferior epigastric artery was identified and protected.  The hernia contents were swept back into the abdominal cavity.  The inguinal ligament and the Cooper's ligament were identified.  The 10 x 16 Bard 3D mesh was then brought into the place and tacked to the pubic tubercle with a pro-tack device.  2 additional tacks were placed in the Cooper's ligament.  The surgery strap device was then used to affix the more superior medial aspect of the mesh.  The lateral half was tucked into the pocket created at the time of the peritoneal opening.  The peritoneum was reapproximated over the mesh with interrupted pro-tack sutures taking care to avoid the epigastric  vessels in the lateral aspect of the inguinal canal.  The abdomen was then desufflated under direct vision.  Skin incision were closed with 4-0 Vicryl suture.  Fascia at the umbilicus was closed with a 0 Vicryl figure-of-eight suture.  Benzoin and Steri-Strips followed by Telfa and Tegaderm dressings were applied.  The Foley catheter was removed.  Patient was taken to recovery room in stable condition.

## 2018-04-06 NOTE — Anesthesia Postprocedure Evaluation (Signed)
Anesthesia Post Note  Patient: Lindsey Shaw  Procedure(s) Performed: LAPAROSCOPIC INGUINAL HERNIA (Right )  Patient location during evaluation: PACU Anesthesia Type: General Level of consciousness: awake and alert Pain management: pain level controlled Vital Signs Assessment: post-procedure vital signs reviewed and stable Respiratory status: spontaneous breathing and respiratory function stable Cardiovascular status: stable Anesthetic complications: no     Last Vitals:  Vitals:   04/06/18 1323 04/06/18 1338  BP: (!) 168/72 (!) 157/80  Pulse: 61 (!) 55  Resp: 16 12  Temp:  36.8 C  SpO2: 100% 98%    Last Pain:  Vitals:   04/06/18 1338  TempSrc:   PainSc: 1                  Lakina Mcintire K

## 2018-04-06 NOTE — Discharge Instructions (Signed)
AMBULATORY SURGERY  °DISCHARGE INSTRUCTIONS ° ° °1) The drugs that you were given will stay in your system until tomorrow so for the next 24 hours you should not: ° °A) Drive an automobile °B) Make any legal decisions °C) Drink any alcoholic beverage ° ° °2) You may resume regular meals tomorrow.  Today it is better to start with liquids and gradually work up to solid foods. ° °You may eat anything you prefer, but it is better to start with liquids, then soup and crackers, and gradually work up to solid foods. ° ° °3) Please notify your doctor immediately if you have any unusual bleeding, trouble breathing, redness and pain at the surgery site, drainage, fever, or pain not relieved by medication. ° ° ° °4) Additional Instructions: ° ° ° ° ° ° ° °Please contact your physician with any problems or Same Day Surgery at [phone], Monday through Friday 6 am to 4 pm, or [place] at [place] Main number at [phone]. °

## 2018-04-06 NOTE — Anesthesia Post-op Follow-up Note (Signed)
Anesthesia QCDR form completed.        

## 2018-04-06 NOTE — Anesthesia Procedure Notes (Signed)
Procedure Name: Intubation Date/Time: 04/06/2018 11:53 AM Performed by: Carron Curie, CRNA Pre-anesthesia Checklist: Patient identified, Patient being monitored, Timeout performed, Emergency Drugs available and Suction available Patient Re-evaluated:Patient Re-evaluated prior to induction Oxygen Delivery Method: Circle System Utilized Preoxygenation: Pre-oxygenation with 100% oxygen Induction Type: IV induction Ventilation: Mask ventilation without difficulty Laryngoscope Size: Mac and 3 Grade View: Grade III Tube type: Oral Tube size: 7.0 mm Number of attempts: 1 Airway Equipment and Method: Stylet and Bougie stylet Placement Confirmation: ETT inserted through vocal cords under direct vision,  positive ETCO2 and breath sounds checked- equal and bilateral Secured at: 21 cm Tube secured with: Tape Dental Injury: Teeth and Oropharynx as per pre-operative assessment

## 2018-04-06 NOTE — H&P (Signed)
No change in clinical history or exam. For right inguinal hernia repair.  

## 2018-04-06 NOTE — Anesthesia Preprocedure Evaluation (Signed)
Anesthesia Evaluation  Patient identified by MRN, date of birth, ID band Patient awake    Reviewed: Allergy & Precautions, NPO status , Patient's Chart, lab work & pertinent test results  History of Anesthesia Complications Negative for: history of anesthetic complications  Airway Mallampati: II       Dental   Pulmonary neg sleep apnea, neg COPD,           Cardiovascular hypertension, Pt. on medications + Past MI  (-) CHF (-) dysrhythmias + Valvular Problems/Murmurs (murmur, no tx)      Neuro/Psych neg Seizures Anxiety Depression    GI/Hepatic Neg liver ROS, GERD  Medicated and Controlled,  Endo/Other  diabetes, Type 2, Oral Hypoglycemic Agents  Renal/GU Renal InsufficiencyRenal disease     Musculoskeletal   Abdominal   Peds  Hematology   Anesthesia Other Findings   Reproductive/Obstetrics                            Anesthesia Physical Anesthesia Plan  ASA: III  Anesthesia Plan: General   Post-op Pain Management:    Induction: Intravenous  PONV Risk Score and Plan: 3 and Dexamethasone, Ondansetron, Midazolam and Treatment may vary due to age or medical condition  Airway Management Planned: LMA and Oral ETT  Additional Equipment:   Intra-op Plan:   Post-operative Plan:   Informed Consent: I have reviewed the patients History and Physical, chart, labs and discussed the procedure including the risks, benefits and alternatives for the proposed anesthesia with the patient or authorized representative who has indicated his/her understanding and acceptance.     Plan Discussed with:   Anesthesia Plan Comments:         Anesthesia Quick Evaluation

## 2018-04-06 NOTE — Transfer of Care (Signed)
Immediate Anesthesia Transfer of Care Note  Patient: Lindsey Shaw  Procedure(s) Performed: LAPAROSCOPIC INGUINAL HERNIA (Right )  Patient Location: PACU  Anesthesia Type:General  Level of Consciousness: awake  Airway & Oxygen Therapy: Patient Spontanous Breathing  Post-op Assessment: Report given to RN  Post vital signs: stable  Last Vitals:  Vitals Value Taken Time  BP 180/75 04/06/2018  1:08 PM  Temp 36.6 C 04/06/2018  1:08 PM  Pulse 64 04/06/2018  1:09 PM  Resp 13 04/06/2018  1:09 PM  SpO2 100 % 04/06/2018  1:09 PM  Vitals shown include unvalidated device data.  Last Pain:  Vitals:   04/06/18 0958  TempSrc: Oral  PainSc: 0-No pain         Complications: No apparent anesthesia complications

## 2018-04-06 NOTE — OR Nursing (Signed)
Dr. Bary Castilla in to see pt approx 4 pm.

## 2018-04-06 NOTE — Anesthesia Procedure Notes (Signed)
Procedure Name: Intubation Date/Time: 04/06/2018 11:53 AM Performed by: Carron Curie, CRNA Pre-anesthesia Checklist: Patient identified, Patient being monitored, Timeout performed, Emergency Drugs available and Suction available Patient Re-evaluated:Patient Re-evaluated prior to induction Oxygen Delivery Method: Circle system utilized Preoxygenation: Pre-oxygenation with 100% oxygen Induction Type: IV induction Ventilation: Mask ventilation without difficulty Laryngoscope Size: Mac and 3 Grade View: Grade II Tube type: Oral Tube size: 7.0 mm Number of attempts: 1 Airway Equipment and Method: Stylet Placement Confirmation: ETT inserted through vocal cords under direct vision,  positive ETCO2 and breath sounds checked- equal and bilateral Secured at: 21 cm Tube secured with: Tape Dental Injury: Teeth and Oropharynx as per pre-operative assessment

## 2018-04-07 ENCOUNTER — Encounter: Payer: Self-pay | Admitting: General Surgery

## 2018-04-21 ENCOUNTER — Ambulatory Visit (INDEPENDENT_AMBULATORY_CARE_PROVIDER_SITE_OTHER): Payer: Medicare Other | Admitting: General Surgery

## 2018-04-21 ENCOUNTER — Encounter: Payer: Self-pay | Admitting: General Surgery

## 2018-04-21 VITALS — BP 142/80 | HR 59 | Resp 16 | Ht 65.0 in | Wt 144.0 lb

## 2018-04-21 DIAGNOSIS — K4090 Unilateral inguinal hernia, without obstruction or gangrene, not specified as recurrent: Secondary | ICD-10-CM

## 2018-04-21 NOTE — Progress Notes (Signed)
Patient ID: Lindsey Shaw, female   DOB: October 24, 1941, 76 y.o.   MRN: [id number]  Chief Complaint  Patient presents with  . Routine Post Op    HPI Lindsey Shaw is a 77 y.o. female here today for her post op right inguinal hernia repair done on 04/06/2018. Patient states she is doing much better.  HPI  Past Medical History:  Diagnosis Date  . Anxiety   . Arthritis   . Blood clotting tendency ([place])    2006   BLOOD CLOT LEG TO GROIN/ post fall  . Cancer ([place]) 2005, 2010   MELANOMA   . Chronic kidney disease    Stage 3 Kidney Disease  . Depression   . Diabetes mellitus without complication ([place])   . DVT (deep venous thrombosis) ([place]) 2006   from fall  . 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[place]) 2006   Left leg  . GERD (gastroesophageal reflux disease)   . Gout   . Hyperlipidemia   . Hypertension   . Mental disorder   . Myocardial infarction ([place])    10+ YRS (Lexiscan: no evid of ischemia/infarct, EF 72% 06/23/12 [facility])    Past Surgical History:  Procedure Laterality Date  . APPENDECTOMY  1967  . BACK SURGERY     2007  . CATARACT EXTRACTION W/PHACO Left 11/05/2016   Procedure: CATARACT EXTRACTION PHACO AND INTRAOCULAR LENS PLACEMENT (IOC);  Surgeon: Birder Robson, MD;  Location: [facility] ORS;  Service: Ophthalmology;  Laterality: Left;  Lot # [id number] H [place]: 01:16.4 AP%:24.0 CDE: 18.29  . CATARACT EXTRACTION W/PHACO Right 11/26/2016   Procedure: CATARACT EXTRACTION PHACO AND INTRAOCULAR LENS PLACEMENT (IOC);  Surgeon: Birder Robson, MD;  Location: [facility];  Service: Ophthalmology;  Laterality: Right;  [place] 48.1 AP% 15.4 CDE 7.36 Fluid pack lot # [id number]  . CHOLECYSTECTOMY  1967  . EYE SURGERY    . INGUINAL HERNIA REPAIR Right 04/06/2018   Procedure: LAPAROSCOPIC INGUINAL HERNIA;  Surgeon: Robert Bellow, MD;  Location: [facility] ORS;  Service: General;  Laterality: Right;  . KYPHOPLASTY N/A 11/21/2015   Procedure: KYPHOPLASTY THORACIC 12;  Surgeon: Hessie Knows, MD;   Location: [facility] ORS;  Service: Orthopedics;  Laterality: N/A;  . LUMBAR LAMINECTOMY/DECOMPRESSION MICRODISCECTOMY Right 06/28/2013   Procedure: LUMBAR LAMINECTOMY/DECOMPRESSION MICRODISCECTOMY 1 LEVEL;  Surgeon: Elaina Hoops, MD;  Location: [place] [facility];  Service: Neurosurgery;  Laterality: Right;  Lumbar Laminectomy Decompression Lumbar Four-Five Right  . TOE SURGERY Bilateral    BIL GREAT TOE JOINT REMOVED  . TOTAL KNEE ARTHROPLASTY Right 04/09/2017  . TOTAL KNEE ARTHROPLASTY Right 04/09/2017   Procedure: RIGHT TOTAL KNEE ARTHROPLASTY;  Surgeon: Ninetta Lights, MD;  Location: [place];  Service: Orthopedics;  Laterality: Right;  . TUBAL LIGATION     38    Family History  Problem Relation Age of Onset  . Ovarian cancer Mother   . Pancreatic cancer Mother   . Lung cancer Father   . AAA (abdominal aortic aneurysm) Paternal Aunt   . Breast cancer Maternal Aunt        60's  . Breast cancer Maternal Uncle        60's  . Breast cancer Cousin        1 cousin-maternal side    Social History Social History   Tobacco Use  . Smoking status: Never Smoker  . Smokeless tobacco: Never Used  Substance Use Topics  . Alcohol use: No  . Drug use: No    Allergies  Allergen Reactions  .  Keflex [Cephalexin] Swelling    SWELLING REACTION UNSPECIFIED     Current Outpatient Medications  Medication Sig Dispense Refill  . acetaminophen (TYLENOL) 500 MG tablet Take 500-1,000 mg by mouth every 6 (six) hours as needed (for pain.).    Marland Kitchen aspirin EC 81 MG tablet Take 81 mg by mouth every evening.    Marland Kitchen atorvastatin (LIPITOR) 40 MG tablet Take 40 mg by mouth every evening.    . citalopram (CELEXA) 20 MG tablet Take 20 mg by mouth every evening.    . Febuxostat (ULORIC) 80 MG TABS Take 80 mg by mouth every evening.    Marland Kitchen losartan (COZAAR) 100 MG tablet Take 100 mg by mouth every evening.     . metFORMIN (GLUCOPHAGE-XR) 500 MG 24 hr tablet Take 500 mg by mouth every evening.    Marland Kitchen omeprazole (PRILOSEC) 20 MG  capsule Take 20 mg by mouth every evening.     No current facility-administered medications for this visit.    Facility-Administered Medications Ordered in Other Visits  Medication Dose Route Frequency Provider Last Rate Last Dose  . fentaNYL (SUBLIMAZE) injection 25 mcg  25 mcg Intravenous Q5 min PRN Alvin Critchley, MD   25 mcg at 04/06/18 1227  . ondansetron (ZOFRAN) injection 4 mg  4 mg Intravenous Once PRN Alvin Critchley, MD        Review of Systems Review of Systems  Constitutional: Negative.   Respiratory: Negative.   Cardiovascular: Negative.   Gastrointestinal: Negative for abdominal pain, constipation and diarrhea.    Blood pressure (!) 142/80, pulse (!) 59, resp. rate 16, height 5\' 5"  (1.651 m), weight 144 lb (65.3 kg), SpO2 98 %.  Physical Exam Physical Exam  Constitutional: She is oriented to person, place, and time. She appears well-developed and well-nourished.  Abdominal: Soft. Bowel sounds are normal. No hernia.    Right inguinal hernia repair intact and healing well.   Neurological: She is alert and oriented to person, place, and time.  Skin: Skin is dry.    Data Reviewed 10 x 16 cm Bard 3D mesh used during repair.  Assessment    Doing well post lap right inguinal hernia repair.  Asymptomatic seroma.    Plan  Patient to return in three months.  Proper lifting techniques reviewed. The patient is aware to call back for any questions or concerns.  HPI, Physical Exam, Assessment and Plan have been scribed under the direction and in the presence of Hervey Ard, MD.  Gaspar Cola, Hardy Nadege Carriger 04/21/2018, 1:18 PM

## 2018-04-21 NOTE — Patient Instructions (Addendum)
Patient to return in three months.  Proper lifting techniques reviewed. The patient is aware to call back for any questions or concerns.

## 2018-07-23 ENCOUNTER — Telehealth: Payer: Self-pay

## 2018-07-23 NOTE — Telephone Encounter (Signed)
Patient called and cancelled her 3 month follow up for hernia repair post op. She states that she is doing very well and did not feel the need for a follow up appointment. I let her know that I would let Dr Bary Castilla know.

## 2018-07-28 ENCOUNTER — Ambulatory Visit: Payer: Medicare Other | Admitting: General Surgery

## 2019-01-12 ENCOUNTER — Other Ambulatory Visit: Payer: Self-pay | Admitting: Internal Medicine

## 2019-01-12 DIAGNOSIS — Z1231 Encounter for screening mammogram for malignant neoplasm of breast: Secondary | ICD-10-CM

## 2019-02-18 ENCOUNTER — Ambulatory Visit
Admission: RE | Admit: 2019-02-18 | Discharge: 2019-02-18 | Disposition: A | Discharge status: Home / Self Care | Payer: Medicare Other | Source: Ambulatory Visit | Attending: Internal Medicine | Admitting: Internal Medicine

## 2019-02-18 DIAGNOSIS — Z1231 Encounter for screening mammogram for malignant neoplasm of breast: Secondary | ICD-10-CM | POA: Diagnosis present

## 2020-01-13 ENCOUNTER — Other Ambulatory Visit: Payer: Self-pay | Admitting: Internal Medicine

## 2020-01-13 DIAGNOSIS — Z1231 Encounter for screening mammogram for malignant neoplasm of breast: Secondary | ICD-10-CM

## 2020-02-12 ENCOUNTER — Ambulatory Visit: Payer: Medicare Other | Attending: Internal Medicine

## 2020-02-12 DIAGNOSIS — Z23 Encounter for immunization: Secondary | ICD-10-CM | POA: Insufficient documentation

## 2020-02-24 ENCOUNTER — Ambulatory Visit
Admission: RE | Admit: 2020-02-24 | Discharge: 2020-02-24 | Disposition: A | Discharge status: Home / Self Care | Payer: Medicare Other | Source: Ambulatory Visit | Attending: Internal Medicine | Admitting: Internal Medicine

## 2020-02-24 DIAGNOSIS — Z1231 Encounter for screening mammogram for malignant neoplasm of breast: Secondary | ICD-10-CM | POA: Diagnosis present

## 2020-03-05 ENCOUNTER — Encounter: Payer: Self-pay | Admitting: Emergency Medicine

## 2020-03-05 ENCOUNTER — Emergency Department
Admission: EM | Admit: 2020-03-05 | Discharge: 2020-03-05 | Disposition: A | Discharge status: Home / Self Care | Payer: Medicare Other | Attending: Emergency Medicine | Admitting: Emergency Medicine

## 2020-03-05 ENCOUNTER — Emergency Department: Payer: Medicare Other

## 2020-03-05 ENCOUNTER — Other Ambulatory Visit: Payer: Self-pay

## 2020-03-05 DIAGNOSIS — Z86718 Personal history of other venous thrombosis and embolism: Secondary | ICD-10-CM | POA: Diagnosis not present

## 2020-03-05 DIAGNOSIS — I129 Hypertensive chronic kidney disease with stage 1 through stage 4 chronic kidney disease, or unspecified chronic kidney disease: Secondary | ICD-10-CM | POA: Insufficient documentation

## 2020-03-05 DIAGNOSIS — M7918 Myalgia, other site: Secondary | ICD-10-CM

## 2020-03-05 DIAGNOSIS — M79604 Pain in right leg: Secondary | ICD-10-CM | POA: Diagnosis present

## 2020-03-05 DIAGNOSIS — Z79899 Other long term (current) drug therapy: Secondary | ICD-10-CM | POA: Diagnosis not present

## 2020-03-05 DIAGNOSIS — E1122 Type 2 diabetes mellitus with diabetic chronic kidney disease: Secondary | ICD-10-CM | POA: Insufficient documentation

## 2020-03-05 DIAGNOSIS — Z7984 Long term (current) use of oral hypoglycemic drugs: Secondary | ICD-10-CM | POA: Diagnosis not present

## 2020-03-05 DIAGNOSIS — N184 Chronic kidney disease, stage 4 (severe): Secondary | ICD-10-CM | POA: Diagnosis not present

## 2020-03-05 NOTE — ED Triage Notes (Signed)
Pt to ED from [facility] for right calf pain. Pt has hx/o DVT. Pt has 10/10 pain when standing. Pt is not on blood thinners.

## 2020-03-05 NOTE — ED Provider Notes (Signed)
[facility] Emergency Department Provider Note  Time seen: 3:47 PM  I have reviewed the triage vital signs and the nursing notes.   HISTORY  Chief Complaint Leg Swelling   HPI Lindsey Shaw is a 79 y.o. female with a past medical history anxiety, prior DVT now off of anticoagulation, CKD, gastric reflux, hypertension, hyperlipidemia presents to the emergency department for right calf pain.  According to the patient since early this morning she has been experiencing pain in her right calf.  States the pain is mostly when she is walking.  Denies any discomfort currently.  Denies any redness in the leg.  Patient does have a history of a prior DVT so her PCP sent her to the ED to rule out blood clot.  Patient denies any chest pain or shortness of breath.   Past Medical History:  Diagnosis Date  . Anxiety   . Arthritis   . Blood clotting tendency ([place])    2006   BLOOD CLOT LEG TO GROIN/ post fall  . Cancer ([place]) 2005, 2010   MELANOMA   . Chronic kidney disease    Stage 3 Kidney Disease  . Depression   . Diabetes mellitus without complication ([place])   . DVT (deep venous thrombosis) ([place]) 2006   from fall  . 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[place]) 2006   Left leg  . GERD (gastroesophageal reflux disease)   . Gout   . Hyperlipidemia   . Hypertension   . Mental disorder   . Myocardial infarction ([place])    10+ YRS (Lexiscan: no evid of ischemia/infarct, EF 72% 06/23/12 [facility])    Patient Active Problem List   Diagnosis Date Noted  . Right inguinal hernia 03/25/2018  . Primary localized osteoarthritis of right knee 04/09/2017  . Abnormal EKG 01/22/2016  . History of chest pain 01/22/2016  . Uncontrolled type 2 diabetes mellitus without complication, without long-term current use of insulin 01/22/2016  . Hyperlipidemia 01/22/2016  . Essential hypertension 01/22/2016  . Intractable pain 10/21/2015    Past Surgical History:  Procedure Laterality Date  .  APPENDECTOMY  1967  . BACK SURGERY     2007  . CATARACT EXTRACTION W/PHACO Left 11/05/2016   Procedure: CATARACT EXTRACTION PHACO AND INTRAOCULAR LENS PLACEMENT (IOC);  Surgeon: Birder Robson, MD;  Location: [facility] ORS;  Service: Ophthalmology;  Laterality: Left;  Lot # [id number] H [place]: 01:16.4 AP%:24.0 CDE: 18.29  . CATARACT EXTRACTION W/PHACO Right 11/26/2016   Procedure: CATARACT EXTRACTION PHACO AND INTRAOCULAR LENS PLACEMENT (IOC);  Surgeon: Birder Robson, MD;  Location: [facility];  Service: Ophthalmology;  Laterality: Right;  [place] 48.1 AP% 15.4 CDE 7.36 Fluid pack lot # [id number]  . CHOLECYSTECTOMY  1967  . EYE SURGERY    . INGUINAL HERNIA REPAIR Right 04/06/2018   Procedure: LAPAROSCOPIC INGUINAL HERNIA;  Surgeon: Robert Bellow, MD;  Location: [facility] ORS;  Service: General;  Laterality: Right;  . KYPHOPLASTY N/A 11/21/2015   Procedure: KYPHOPLASTY THORACIC 12;  Surgeon: Hessie Knows, MD;  Location: [facility] ORS;  Service: Orthopedics;  Laterality: N/A;  . LUMBAR LAMINECTOMY/DECOMPRESSION MICRODISCECTOMY Right 06/28/2013   Procedure: LUMBAR LAMINECTOMY/DECOMPRESSION MICRODISCECTOMY 1 LEVEL;  Surgeon: Elaina Hoops, MD;  Location: [place] [facility];  Service: Neurosurgery;  Laterality: Right;  Lumbar Laminectomy Decompression Lumbar Four-Five Right  . TOE SURGERY Bilateral    BIL GREAT TOE JOINT REMOVED  . TOTAL KNEE ARTHROPLASTY Right 04/09/2017  . TOTAL KNEE ARTHROPLASTY Right 04/09/2017   Procedure: RIGHT TOTAL KNEE ARTHROPLASTY;  Surgeon: Ninetta Lights, MD;  Location: [place];  Service: Orthopedics;  Laterality: Right;  . TUBAL LIGATION     73    Prior to Admission medications   Medication Sig Start Date End Date Taking? Authorizing Provider  acetaminophen (TYLENOL) 500 MG tablet Take 500-1,000 mg by mouth every 6 (six) hours as needed (for pain.).    [provider]  aspirin EC 81 MG tablet Take 81 mg by mouth every evening.    [provider]  atorvastatin (LIPITOR) 40  MG tablet Take 40 mg by mouth every evening. 08/01/16   [provider]  citalopram (CELEXA) 20 MG tablet Take 20 mg by mouth every evening.    [provider]  Febuxostat (ULORIC) 80 MG TABS Take 80 mg by mouth every evening.    [provider]  losartan (COZAAR) 100 MG tablet Take 100 mg by mouth every evening.     [provider]  metFORMIN (GLUCOPHAGE-XR) 500 MG 24 hr tablet Take 500 mg by mouth every evening. 09/11/16   [provider]  omeprazole (PRILOSEC) 20 MG capsule Take 20 mg by mouth every evening.    [provider]    Allergies  Allergen Reactions  . Keflex [Cephalexin] Swelling    SWELLING REACTION UNSPECIFIED     Family History  Problem Relation Age of Onset  . Ovarian cancer Mother   . Pancreatic cancer Mother   . Lung cancer Father   . AAA (abdominal aortic aneurysm) Paternal Aunt   . Breast cancer Maternal Aunt        60's  . Breast cancer Maternal Uncle        60's  . Breast cancer Cousin        1 cousin-maternal side    Social History Social History   Tobacco Use  . Smoking status: Never Smoker  . Smokeless tobacco: Never Used  Substance Use Topics  . Alcohol use: No  . Drug use: No    Review of Systems Constitutional: Negative for fever. Cardiovascular: Negative for chest pain. Respiratory: Negative for shortness of breath. Gastrointestinal: Negative for abdominal pain Musculoskeletal: Right calf pain worse with ambulation. Neurological: Negative for headache All other ROS negative  ____________________________________________   PHYSICAL EXAM:  VITAL SIGNS: ED Triage Vitals  Enc Vitals Group     BP 03/05/20 1329 (!) 195/88     Pulse Rate 03/05/20 1329 (!) 55     Resp 03/05/20 1329 16     Temp 03/05/20 1329 98.3 F (36.8 C)     Temp Source 03/05/20 1329 Oral     SpO2 03/05/20 1329 96 %     Weight --      Height --      Head Circumference --      Peak Flow --      Pain Score  03/05/20 1327 10     Pain Loc --      Pain Edu? --      Excl. in [place]? --    Constitutional: Alert and oriented. Well appearing and in no distress. Eyes: Normal exam ENT      Head: Normocephalic and atraumatic.      Mouth/Throat: Mucous membranes are moist. Cardiovascular: Normal rate, regular rhythm Respiratory: Normal respiratory effort without tachypnea nor retractions. Breath sounds are clear  Gastrointestinal: Soft and nontender. No distention Musculoskeletal: Mild tenderness palpation of the right calf.  No gross edema, no erythema.  2+ DP pulse. Neurologic:  Normal speech  and language. No gross focal neurologic deficits Skin:  Skin is warm, dry and intact.  Psychiatric: Mood and affect are normal.    RADIOLOGY  Ultrasound negative for DVT.  ____________________________________________   INITIAL IMPRESSION / ASSESSMENT AND PLAN / ED COURSE  Pertinent labs & imaging results that were available during my care of the patient were reviewed by me and considered in my medical decision making (see chart for details).   Patient presents to the emergency department for right leg pain.  Has a history of prior DVT.  Patient states the pain is only in her calf and mostly when she ambulates.  Denies any pain currently.  Patient has no erythema, 2+ DP pulse, mild tenderness to palpation over the calf muscles.  Great range of motion in joints.  Ultrasound negative for DVT.  Likely musculoskeletal pain.  Patient states she did take a tramadol this morning and it did help with her discomfort.  Lindsey Shaw was evaluated in Emergency Department on 03/05/2020 for the symptoms described in the history of present illness. She was evaluated in the context of the global COVID-19 pandemic, which necessitated consideration that the patient might be at risk for infection with the SARS-CoV-2 virus that causes COVID-19. Institutional protocols and algorithms that pertain to the evaluation of patients  at risk for COVID-19 are in a state of rapid change based on information released by regulatory bodies including the CDC and federal and state organizations. These policies and algorithms were followed during the patient's care in the ED.  ____________________________________________   FINAL CLINICAL IMPRESSION(S) / ED DIAGNOSES  Musculoskeletal pain   Harvest Dark, MD 03/05/20 1549

## 2020-03-11 ENCOUNTER — Ambulatory Visit: Payer: Medicare Other | Attending: Neurology

## 2020-03-11 DIAGNOSIS — Z23 Encounter for immunization: Secondary | ICD-10-CM

## 2020-03-11 NOTE — Progress Notes (Signed)
   Covid-19 Vaccination Clinic  Name:  Krystalynn Ridgeway    MRN: [id number] DOB: 09-09-1941  03/11/2020  Ms. Rudge was observed post Covid-19 immunization for 30 minutes based on pre-vaccination screening without incident. She was provided with Vaccine Information Sheet and instruction to access the V-Safe system.   Ms. Leyland was instructed to call 911 with any severe reactions post vaccine: Marland Kitchen Difficulty breathing  . Swelling of face and throat  . A fast heartbeat  . A bad rash all over body  . Dizziness and weakness   Immunizations Administered    Name Date Dose VIS Date Route   Moderna COVID-19 Vaccine 03/11/2020  9:01 AM 0.5 mL 11/16/2019 Intramuscular   Manufacturer: Moderna   Lot: [id number]   [place]: [id number]

## 2020-03-14 ENCOUNTER — Ambulatory Visit: Payer: Medicare Other

## 2020-06-06 ENCOUNTER — Other Ambulatory Visit: Payer: Self-pay | Admitting: Internal Medicine

## 2020-06-06 DIAGNOSIS — R2991 Unspecified symptoms and signs involving the musculoskeletal system: Secondary | ICD-10-CM

## 2020-06-23 ENCOUNTER — Ambulatory Visit
Admission: RE | Admit: 2020-06-23 | Discharge: 2020-06-23 | Disposition: A | Discharge status: Home / Self Care | Payer: Medicare Other | Source: Ambulatory Visit | Attending: Internal Medicine | Admitting: Internal Medicine

## 2020-06-23 ENCOUNTER — Other Ambulatory Visit: Payer: Self-pay

## 2020-06-23 DIAGNOSIS — R2991 Unspecified symptoms and signs involving the musculoskeletal system: Secondary | ICD-10-CM | POA: Diagnosis not present

## 2020-09-14 ENCOUNTER — Other Ambulatory Visit: Payer: Self-pay

## 2020-09-14 ENCOUNTER — Other Ambulatory Visit: Payer: Self-pay | Admitting: Physician Assistant

## 2020-09-14 ENCOUNTER — Ambulatory Visit
Admission: RE | Admit: 2020-09-14 | Discharge: 2020-09-14 | Disposition: A | Discharge status: Home / Self Care | Payer: Medicare Other | Source: Ambulatory Visit | Attending: Physician Assistant | Admitting: Physician Assistant

## 2020-09-14 DIAGNOSIS — R519 Headache, unspecified: Secondary | ICD-10-CM

## 2020-10-07 ENCOUNTER — Ambulatory Visit
Admission: EM | Admit: 2020-10-07 | Discharge: 2020-10-07 | Disposition: A | Discharge status: Home / Self Care | Payer: Medicare Other | Attending: Family Medicine | Admitting: Family Medicine

## 2020-10-07 ENCOUNTER — Encounter: Payer: Self-pay | Admitting: Emergency Medicine

## 2020-10-07 ENCOUNTER — Other Ambulatory Visit: Payer: Self-pay

## 2020-10-07 ENCOUNTER — Ambulatory Visit
Admission: RE | Admit: 2020-10-07 | Discharge: 2020-10-07 | Disposition: A | Discharge status: Home / Self Care | Payer: Medicare Other | Source: Ambulatory Visit | Attending: Family Medicine | Admitting: Family Medicine

## 2020-10-07 DIAGNOSIS — Z86718 Personal history of other venous thrombosis and embolism: Secondary | ICD-10-CM | POA: Insufficient documentation

## 2020-10-07 DIAGNOSIS — R600 Localized edema: Secondary | ICD-10-CM

## 2020-10-07 DIAGNOSIS — R609 Edema, unspecified: Secondary | ICD-10-CM | POA: Diagnosis present

## 2020-10-07 NOTE — ED Triage Notes (Signed)
Patient c/o left ankle and foot pain and swelling that started 3 days ago.  Patient denies injury or fall.

## 2020-10-07 NOTE — ED Provider Notes (Signed)
[facility]    CSN: [id number] Arrival date & time: 10/07/20  1313      History   Chief Complaint Chief Complaint  Patient presents with   Foot Pain    left   Ankle Pain    left   HPI  79 year old female presents with lower extremity edema.  Patient reports that this has been going on for the past 3 days.  She reports left lower extremity swelling and associated pain.  Patient has a history of DVT.  Also has a history of gout.  She reports mild redness on the dorsum of her left lower extremity.  Patient also has swelling of the right lower extremity.  No shortness of breath.  No fall, trauma, injury.  No other associated symptoms.  No other complaints.  Past Medical History:  Diagnosis Date   Anxiety    Arthritis    Blood clotting tendency ([place])    2006   BLOOD CLOT LEG TO GROIN/ post fall   Cancer ([place]) 2005, 2010   MELANOMA    Chronic kidney disease    Stage 3 Kidney Disease   Depression    Diabetes mellitus without complication ([place])    DVT (deep venous thrombosis) ([place]) 2006   from fall   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[place]) 2006   Left leg   GERD (gastroesophageal reflux disease)    Gout    Hyperlipidemia    Hypertension    Mental disorder    Myocardial infarction ([place])    10+ YRS (Lexiscan: no evid of ischemia/infarct, EF 72% 06/23/12 [facility])    Patient Active Problem List   Diagnosis Date Noted   Right inguinal hernia 03/25/2018   Primary localized osteoarthritis of right knee 04/09/2017   Abnormal EKG 01/22/2016   History of chest pain 01/22/2016   Uncontrolled type 2 diabetes mellitus without complication, without long-term current use of insulin 01/22/2016   Hyperlipidemia 01/22/2016   Essential hypertension 01/22/2016   Intractable pain 10/21/2015    Past Surgical History:  Procedure Laterality Date   APPENDECTOMY  1967   BACK SURGERY     2007   CATARACT EXTRACTION W/PHACO Left 11/05/2016   Procedure:  CATARACT EXTRACTION PHACO AND INTRAOCULAR LENS PLACEMENT ([place]);  Surgeon: Birder Robson, MD;  Location: [facility] ORS;  Service: Ophthalmology;  Laterality: Left;  Lot # [id number] [place]: 01:16.4 AP%:24.0 CDE: 18.29   CATARACT EXTRACTION W/PHACO Right 11/26/2016   Procedure: CATARACT EXTRACTION PHACO AND INTRAOCULAR LENS PLACEMENT ([facility]);  Surgeon: Birder Robson, MD;  Location: [facility];  Service: Ophthalmology;  Laterality: Right;  [place] 48.1 AP% 15.4 CDE 7.36 Fluid pack lot # [id number]   CHOLECYSTECTOMY  1967   EYE SURGERY     INGUINAL HERNIA REPAIR Right 04/06/2018   Procedure: LAPAROSCOPIC INGUINAL HERNIA;  Surgeon: Robert Bellow, MD;  Location: [facility];  Service: General;  Laterality: Right;   KYPHOPLASTY N/A 11/21/2015   Procedure: KXFGHWEXHBZ THORACIC 12;  Surgeon: Hessie Knows, MD;  Location: [facility];  Service: Orthopedics;  Laterality: N/A;   LUMBAR LAMINECTOMY/DECOMPRESSION MICRODISCECTOMY Right 06/28/2013   Procedure: LUMBAR LAMINECTOMY/DECOMPRESSION MICRODISCECTOMY 1 LEVEL;  Surgeon: Elaina Hoops, MD;  Location: [place] [facility];  Service: Neurosurgery;  Laterality: Right;  Lumbar Laminectomy Decompression Lumbar Four-Five Right   TOE SURGERY Bilateral    BIL GREAT TOE JOINT REMOVED   TOTAL KNEE ARTHROPLASTY Right 04/09/2017   TOTAL KNEE ARTHROPLASTY Right 04/09/2017   Procedure: RIGHT TOTAL KNEE ARTHROPLASTY;  Surgeon: Ninetta Lights, MD;  Location: [facility];  Service: Orthopedics;  Laterality: Right;   TUBAL LIGATION     60    OB History    Gravida  2   Para  2   Term      Preterm      AB      Living        SAB      TAB      Ectopic      Multiple      Live Births           Obstetric Comments  1st Menstrual Cycle:  17  1st Pregnancy:  46 Menopause age 28         Home Medications    Prior to Admission medications   Medication Sig Start Date End Date Taking? Authorizing Provider  aspirin EC 81 MG tablet Take 81 mg by mouth every evening.   Yes  [provider]  atorvastatin (LIPITOR) 40 MG tablet Take 40 mg by mouth every evening. 08/01/16  Yes [provider]  citalopram (CELEXA) 20 MG tablet Take 20 mg by mouth every evening.   Yes [provider]  Febuxostat (ULORIC) 80 MG TABS Take 80 mg by mouth every evening.   Yes [provider]  losartan (COZAAR) 100 MG tablet Take 100 mg by mouth every evening.    Yes [provider]  metFORMIN (GLUCOPHAGE-XR) 500 MG 24 hr tablet Take 500 mg by mouth every evening. 09/11/16  Yes [provider]  omeprazole (PRILOSEC) 20 MG capsule Take 20 mg by mouth every evening.   Yes [provider]  acetaminophen (TYLENOL) 500 MG tablet Take 500-1,000 mg by mouth every 6 (six) hours as needed (for pain.).    [provider]    Family History Family History  Problem Relation Age of Onset   Ovarian cancer Mother    Pancreatic cancer Mother    Lung cancer Father    AAA (abdominal aortic aneurysm) Paternal Aunt    Breast cancer Maternal Aunt        60's   Breast cancer Maternal Uncle        60's   Breast cancer Cousin        1 cousin-maternal side    Social History Social History   Tobacco Use   Smoking status: Never Smoker   Smokeless tobacco: Never Used  Scientific laboratory technician Use: Never used  Substance Use Topics   Alcohol use: No   Drug use: No     Allergies   Keflex [cephalexin]   Review of Systems Review of Systems Per HPI  Physical Exam Triage Vital Signs ED Triage Vitals  Enc Vitals Group     BP 10/07/20 1332 (!) 156/73     Pulse Rate 10/07/20 1332 61     Resp 10/07/20 1332 14     Temp 10/07/20 1332 97.8 F (36.6 C)     Temp Source 10/07/20 1332 Oral     SpO2 10/07/20 1332 100 %     Weight 10/07/20 1329 155 lb (70.3 kg)     Height 10/07/20 1329 5\' 5"  (1.651 m)     Head Circumference --      Peak Flow --      Pain Score 10/07/20 1329 10     Pain Loc --      Pain Edu? --      Excl.  in [place]? --    Updated Vital Signs BP Marland Kitchen)  156/73 (BP Location: Left Arm)    Pulse 61    Temp 97.8 F (36.6 C) (Oral)    Resp 14    Ht 5\' 5"  (1.651 m)    Wt 70.3 kg    SpO2 100%    BMI 25.79 kg/m   Visual Acuity Right Eye Distance:   Left Eye Distance:   Bilateral Distance:    Right Eye Near:   Left Eye Near:    Bilateral Near:     Physical Exam Vitals and nursing note reviewed.  Constitutional:      General: She is not in acute distress.    Appearance: Normal appearance. She is not ill-appearing.  HENT:     Head: Normocephalic and atraumatic.  Eyes:     General:        Right eye: No discharge.        Left eye: No discharge.     Conjunctiva/sclera: Conjunctivae normal.  Cardiovascular:     Rate and Rhythm: Normal rate and regular rhythm.  Pulmonary:     Effort: Pulmonary effort is normal.     Breath sounds: Normal breath sounds.  Musculoskeletal:     Right lower leg: Edema present.     Left lower leg: Edema present.  Neurological:     Mental Status: She is alert.  Psychiatric:        Mood and Affect: Mood normal.        Behavior: Behavior normal.    UC Treatments / Results  Labs (all labs ordered are listed, but only abnormal results are displayed) Labs Reviewed - No data to display  EKG   Radiology US Venous Img Lower Bilateral (DVT)  Result Date: 10/07/2020 CLINICAL DATA:  LE edema; history of DVT EXAM: BILATERAL LOWER EXTREMITY VENOUS DOPPLER ULTRASOUND TECHNIQUE: Gray-scale sonography with graded compression, as well as color Doppler and duplex ultrasound were performed to evaluate the lower extremity deep venous systems from the level of the common femoral vein and including the common femoral, femoral, profunda femoral, popliteal and calf veins including the posterior tibial, peroneal and gastrocnemius veins when visible. The superficial great saphenous vein was also interrogated. Spectral Doppler was utilized to evaluate flow at rest and with distal  augmentation maneuvers in the common femoral, femoral and popliteal veins. COMPARISON:  Ultrasound 03/05/2020 FINDINGS: RIGHT LOWER EXTREMITY Common Femoral Vein: No evidence of thrombus. Normal compressibility, respiratory phasicity and response to augmentation. Saphenofemoral Junction: No evidence of thrombus. Normal compressibility and flow on color Doppler imaging. Profunda Femoral Vein: No evidence of thrombus. Normal compressibility and flow on color Doppler imaging. Femoral Vein: No evidence of thrombus. Normal compressibility, respiratory phasicity and response to augmentation. Popliteal Vein: No evidence of thrombus. Normal compressibility, respiratory phasicity and response to augmentation. Calf Veins: No evidence of thrombus. Normal compressibility and flow on color Doppler imaging. Superficial Great Saphenous Vein: No evidence of thrombus. Normal compressibility. LEFT LOWER EXTREMITY Common Femoral Vein: No evidence of thrombus. Normal compressibility, respiratory phasicity and response to augmentation. Saphenofemoral Junction: No evidence of thrombus. Normal compressibility and flow on color Doppler imaging. Profunda Femoral Vein: No evidence of thrombus. Normal compressibility and flow on color Doppler imaging. Femoral Vein: No evidence of thrombus. Normal compressibility, respiratory phasicity and response to augmentation. Popliteal Vein: No evidence of thrombus. Normal compressibility, respiratory phasicity and response to augmentation. Calf Veins: No evidence of thrombus. Normal compressibility and flow on color Doppler imaging. Superficial Great Saphenous Vein: No evidence of thrombus. Normal compressibility. Other Findings: 4.2 x  1.9 x 2.8 cm anechoic cyst in the LEFT popliteal fossa. IMPRESSION: No evidence of deep venous thrombosis in either lower extremity. Non complicated Baker cyst in LEFT popliteal fossa. Electronically Signed   By: Suzy Bouchard M.D.   On: 10/07/2020 15:37     Procedures Procedures (including critical care time)  Medications Ordered in UC Medications - No data to display  Initial Impression / Assessment and Plan / UC Course  I have reviewed the triage vital signs and the nursing notes.  Pertinent labs & imaging results that were available during my care of the patient were reviewed by me and considered in my medical decision making (see chart for details).    79 year old female presents with lower extremity edema.  Given history of DVT, ultrasound was obtained and was negative for DVT.  Patient has bilateral lower extremity edema on exam.  Clinically, I do not feel that this is cellulitis.  Erythema is minimal and areas not particularly warm/hot.  I suspect that this is mostly due to venous stasis.  Advised compression stockings and elevation.  Final Clinical Impressions(s) / UC Diagnoses   Final diagnoses:  Lower extremity edema     Discharge Instructions     Go to [facility] for [place].  We will call with the results.  Take care  Dr. Lacinda Axon    ED Prescriptions    None     PDMP not reviewed this encounter.   Coral Spikes, [place] 10/07/20 1547

## 2020-10-07 NOTE — Discharge Instructions (Signed)
Go to [facility] for [place].  We will call with the results.  Take care  Dr. Lacinda Axon

## 2020-11-06 ENCOUNTER — Other Ambulatory Visit: Payer: Self-pay

## 2020-11-06 DIAGNOSIS — S62102D Fracture of unspecified carpal bone, left wrist, subsequent encounter for fracture with routine healing: Secondary | ICD-10-CM

## 2020-11-06 DIAGNOSIS — I214 Non-ST elevation (NSTEMI) myocardial infarction: Secondary | ICD-10-CM | POA: Diagnosis not present

## 2020-11-06 DIAGNOSIS — M109 Gout, unspecified: Secondary | ICD-10-CM | POA: Diagnosis present

## 2020-11-06 DIAGNOSIS — E1122 Type 2 diabetes mellitus with diabetic chronic kidney disease: Secondary | ICD-10-CM | POA: Diagnosis present

## 2020-11-06 DIAGNOSIS — E785 Hyperlipidemia, unspecified: Secondary | ICD-10-CM | POA: Diagnosis present

## 2020-11-06 DIAGNOSIS — Z20822 Contact with and (suspected) exposure to covid-19: Secondary | ICD-10-CM | POA: Diagnosis present

## 2020-11-06 DIAGNOSIS — Z85820 Personal history of malignant melanoma of skin: Secondary | ICD-10-CM

## 2020-11-06 DIAGNOSIS — R296 Repeated falls: Secondary | ICD-10-CM | POA: Diagnosis present

## 2020-11-06 DIAGNOSIS — I129 Hypertensive chronic kidney disease with stage 1 through stage 4 chronic kidney disease, or unspecified chronic kidney disease: Secondary | ICD-10-CM | POA: Diagnosis present

## 2020-11-06 DIAGNOSIS — Z23 Encounter for immunization: Secondary | ICD-10-CM

## 2020-11-06 DIAGNOSIS — F419 Anxiety disorder, unspecified: Secondary | ICD-10-CM | POA: Diagnosis present

## 2020-11-06 DIAGNOSIS — I252 Old myocardial infarction: Secondary | ICD-10-CM

## 2020-11-06 DIAGNOSIS — D509 Iron deficiency anemia, unspecified: Secondary | ICD-10-CM | POA: Diagnosis present

## 2020-11-06 DIAGNOSIS — Z7982 Long term (current) use of aspirin: Secondary | ICD-10-CM

## 2020-11-06 DIAGNOSIS — Z7984 Long term (current) use of oral hypoglycemic drugs: Secondary | ICD-10-CM

## 2020-11-06 DIAGNOSIS — E1140 Type 2 diabetes mellitus with diabetic neuropathy, unspecified: Secondary | ICD-10-CM | POA: Diagnosis present

## 2020-11-06 DIAGNOSIS — Z86718 Personal history of other venous thrombosis and embolism: Secondary | ICD-10-CM

## 2020-11-06 DIAGNOSIS — I25119 Atherosclerotic heart disease of native coronary artery with unspecified angina pectoris: Secondary | ICD-10-CM | POA: Diagnosis present

## 2020-11-06 DIAGNOSIS — Z79899 Other long term (current) drug therapy: Secondary | ICD-10-CM

## 2020-11-06 DIAGNOSIS — R001 Bradycardia, unspecified: Secondary | ICD-10-CM | POA: Diagnosis not present

## 2020-11-06 DIAGNOSIS — F32A Depression, unspecified: Secondary | ICD-10-CM | POA: Diagnosis present

## 2020-11-06 DIAGNOSIS — W010XXA Fall on same level from slipping, tripping and stumbling without subsequent striking against object, initial encounter: Secondary | ICD-10-CM | POA: Diagnosis present

## 2020-11-06 DIAGNOSIS — Z9049 Acquired absence of other specified parts of digestive tract: Secondary | ICD-10-CM

## 2020-11-06 DIAGNOSIS — N1831 Chronic kidney disease, stage 3a: Secondary | ICD-10-CM | POA: Diagnosis present

## 2020-11-06 DIAGNOSIS — K219 Gastro-esophageal reflux disease without esophagitis: Secondary | ICD-10-CM | POA: Diagnosis present

## 2020-11-06 DIAGNOSIS — Z881 Allergy status to other antibiotic agents status: Secondary | ICD-10-CM

## 2020-11-06 DIAGNOSIS — Z96651 Presence of right artificial knee joint: Secondary | ICD-10-CM | POA: Diagnosis present

## 2020-11-06 NOTE — ED Triage Notes (Signed)
Pt in with co chest pain that started tonight under left breast. Pt did fall a week ago and injured left ribs but all imaging was normal. States this pain is different and felt shob due to pain.  Pt has hx of MI, pt brought in by [facility], took 81 mg asa x 4 prior to coming.

## 2020-11-07 ENCOUNTER — Inpatient Hospital Stay: Payer: Medicare Other

## 2020-11-07 ENCOUNTER — Encounter: Payer: Self-pay | Admitting: Radiology

## 2020-11-07 ENCOUNTER — Emergency Department: Payer: Medicare Other

## 2020-11-07 ENCOUNTER — Inpatient Hospital Stay (HOSPITAL_COMMUNITY)
Admit: 2020-11-07 | Discharge: 2020-11-07 | Disposition: A | Discharge status: Home / Self Care | Payer: Medicare Other | Attending: Internal Medicine | Admitting: Internal Medicine

## 2020-11-07 ENCOUNTER — Inpatient Hospital Stay
Admission: EM | Admit: 2020-11-07 | Discharge: 2020-11-09 | DRG: 282 | Disposition: A | Discharge status: Home / Self Care | Payer: Medicare Other | Attending: Family Medicine | Admitting: Family Medicine

## 2020-11-07 DIAGNOSIS — M109 Gout, unspecified: Secondary | ICD-10-CM | POA: Diagnosis present

## 2020-11-07 DIAGNOSIS — Z20822 Contact with and (suspected) exposure to covid-19: Secondary | ICD-10-CM | POA: Diagnosis present

## 2020-11-07 DIAGNOSIS — Z86718 Personal history of other venous thrombosis and embolism: Secondary | ICD-10-CM | POA: Diagnosis not present

## 2020-11-07 DIAGNOSIS — S62102A Fracture of unspecified carpal bone, left wrist, initial encounter for closed fracture: Secondary | ICD-10-CM | POA: Diagnosis present

## 2020-11-07 DIAGNOSIS — I214 Non-ST elevation (NSTEMI) myocardial infarction: Principal | ICD-10-CM | POA: Diagnosis present

## 2020-11-07 DIAGNOSIS — W010XXA Fall on same level from slipping, tripping and stumbling without subsequent striking against object, initial encounter: Secondary | ICD-10-CM | POA: Diagnosis present

## 2020-11-07 DIAGNOSIS — D509 Iron deficiency anemia, unspecified: Secondary | ICD-10-CM | POA: Diagnosis present

## 2020-11-07 DIAGNOSIS — I25119 Atherosclerotic heart disease of native coronary artery with unspecified angina pectoris: Secondary | ICD-10-CM | POA: Diagnosis present

## 2020-11-07 DIAGNOSIS — N1831 Chronic kidney disease, stage 3a: Secondary | ICD-10-CM | POA: Diagnosis not present

## 2020-11-07 DIAGNOSIS — S62102D Fracture of unspecified carpal bone, left wrist, subsequent encounter for fracture with routine healing: Secondary | ICD-10-CM | POA: Diagnosis not present

## 2020-11-07 DIAGNOSIS — I2510 Atherosclerotic heart disease of native coronary artery without angina pectoris: Secondary | ICD-10-CM | POA: Diagnosis present

## 2020-11-07 DIAGNOSIS — K219 Gastro-esophageal reflux disease without esophagitis: Secondary | ICD-10-CM | POA: Diagnosis not present

## 2020-11-07 DIAGNOSIS — E785 Hyperlipidemia, unspecified: Secondary | ICD-10-CM | POA: Diagnosis present

## 2020-11-07 DIAGNOSIS — W19XXXA Unspecified fall, initial encounter: Secondary | ICD-10-CM

## 2020-11-07 DIAGNOSIS — I259 Chronic ischemic heart disease, unspecified: Secondary | ICD-10-CM

## 2020-11-07 DIAGNOSIS — Z85820 Personal history of malignant melanoma of skin: Secondary | ICD-10-CM | POA: Diagnosis not present

## 2020-11-07 DIAGNOSIS — E1140 Type 2 diabetes mellitus with diabetic neuropathy, unspecified: Secondary | ICD-10-CM | POA: Diagnosis present

## 2020-11-07 DIAGNOSIS — Y92009 Unspecified place in unspecified non-institutional (private) residence as the place of occurrence of the external cause: Secondary | ICD-10-CM

## 2020-11-07 DIAGNOSIS — E119 Type 2 diabetes mellitus without complications: Secondary | ICD-10-CM

## 2020-11-07 DIAGNOSIS — Z9049 Acquired absence of other specified parts of digestive tract: Secondary | ICD-10-CM | POA: Diagnosis not present

## 2020-11-07 DIAGNOSIS — R296 Repeated falls: Secondary | ICD-10-CM | POA: Diagnosis present

## 2020-11-07 DIAGNOSIS — Z96651 Presence of right artificial knee joint: Secondary | ICD-10-CM | POA: Diagnosis present

## 2020-11-07 DIAGNOSIS — R001 Bradycardia, unspecified: Secondary | ICD-10-CM | POA: Diagnosis not present

## 2020-11-07 DIAGNOSIS — N183 Chronic kidney disease, stage 3 unspecified: Secondary | ICD-10-CM | POA: Diagnosis present

## 2020-11-07 DIAGNOSIS — F32A Depression, unspecified: Secondary | ICD-10-CM | POA: Diagnosis present

## 2020-11-07 DIAGNOSIS — Z7984 Long term (current) use of oral hypoglycemic drugs: Secondary | ICD-10-CM | POA: Diagnosis not present

## 2020-11-07 DIAGNOSIS — I252 Old myocardial infarction: Secondary | ICD-10-CM | POA: Diagnosis not present

## 2020-11-07 DIAGNOSIS — E1122 Type 2 diabetes mellitus with diabetic chronic kidney disease: Secondary | ICD-10-CM | POA: Diagnosis present

## 2020-11-07 DIAGNOSIS — F419 Anxiety disorder, unspecified: Secondary | ICD-10-CM | POA: Diagnosis present

## 2020-11-07 DIAGNOSIS — I5189 Other ill-defined heart diseases: Secondary | ICD-10-CM

## 2020-11-07 DIAGNOSIS — I10 Essential (primary) hypertension: Secondary | ICD-10-CM | POA: Diagnosis present

## 2020-11-07 DIAGNOSIS — Z23 Encounter for immunization: Secondary | ICD-10-CM | POA: Diagnosis present

## 2020-11-07 DIAGNOSIS — Z7982 Long term (current) use of aspirin: Secondary | ICD-10-CM | POA: Diagnosis not present

## 2020-11-07 DIAGNOSIS — I129 Hypertensive chronic kidney disease with stage 1 through stage 4 chronic kidney disease, or unspecified chronic kidney disease: Secondary | ICD-10-CM | POA: Diagnosis present

## 2020-11-07 HISTORY — DX: Non-ST elevation (NSTEMI) myocardial infarction: I21.4

## 2020-11-07 HISTORY — DX: Personal history of other medical treatment: Z92.89

## 2020-11-07 HISTORY — DX: Other ill-defined heart diseases: I51.89

## 2020-11-07 LAB — COMPREHENSIVE METABOLIC PANEL
ALT: 11 U/L (ref 0–44)
AST: 18 U/L (ref 15–41)
Albumin: 4.1 g/dL (ref 3.5–5.0)
Alkaline Phosphatase: 94 U/L (ref 38–126)
Anion gap: 10 (ref 5–15)
BUN: 26 mg/dL — ABNORMAL HIGH (ref 8–23)
CO2: 23 mmol/L (ref 22–32)
Calcium: 9.4 mg/dL (ref 8.9–10.3)
Chloride: 103 mmol/L (ref 98–111)
Creatinine, Ser: 1.25 mg/dL — ABNORMAL HIGH (ref 0.44–1.00)
GFR, Estimated: 44 mL/min — ABNORMAL LOW (ref >60)
Glucose, Bld: 164 mg/dL — ABNORMAL HIGH (ref 70–99)
Potassium: 4 mmol/L (ref 3.5–5.1)
Sodium: 136 mmol/L (ref 135–145)
Total Bilirubin: 0.7 mg/dL (ref 0.3–1.2)
Total Protein: 7.7 g/dL (ref 6.5–8.1)

## 2020-11-07 LAB — CBC
HCT: 33.2 % — ABNORMAL LOW (ref 36.0–46.0)
HCT: 34.3 % — ABNORMAL LOW (ref 36.0–46.0)
Hemoglobin: 10.9 g/dL — ABNORMAL LOW (ref 12.0–15.0)
Hemoglobin: 11.2 g/dL — ABNORMAL LOW (ref 12.0–15.0)
MCH: 31 pg (ref 26.0–34.0)
MCH: 31.1 pg (ref 26.0–34.0)
MCHC: 32.7 g/dL (ref 30.0–36.0)
MCHC: 32.8 g/dL (ref 30.0–36.0)
MCV: 94.6 fL (ref 80.0–100.0)
MCV: 95 fL (ref 80.0–100.0)
Platelets: 219 10*3/uL (ref 150–400)
Platelets: 240 10*3/uL (ref 150–400)
RBC: 3.51 MIL/uL — ABNORMAL LOW (ref 3.87–5.11)
RBC: 3.61 MIL/uL — ABNORMAL LOW (ref 3.87–5.11)
RDW: 12.7 % (ref 11.5–15.5)
RDW: 13 % (ref 11.5–15.5)
WBC: 6.1 10*3/uL (ref 4.0–10.5)
WBC: 7.1 10*3/uL (ref 4.0–10.5)
nRBC: 0 % (ref 0.0–0.2)
nRBC: 0 % (ref 0.0–0.2)

## 2020-11-07 LAB — ECHOCARDIOGRAM COMPLETE
AR max vel: 1.7 cm2
AV Area VTI: 1.74 cm2
AV Area mean vel: 1.59 cm2
AV Mean grad: 5 mmHg
AV Peak grad: 8.4 mmHg
Ao pk vel: 1.45 m/s
Area-P 1/2: 3.11 cm2
S' Lateral: 2.98 cm

## 2020-11-07 LAB — MAGNESIUM: Magnesium: 1.8 mg/dL (ref 1.7–2.4)

## 2020-11-07 LAB — TROPONIN I (HIGH SENSITIVITY)
Troponin I (High Sensitivity): 266 ng/L (ref <18)
Troponin I (High Sensitivity): 286 ng/L (ref <18)
Troponin I (High Sensitivity): 304 ng/L (ref <18)
Troponin I (High Sensitivity): 430 ng/L (ref <18)
Troponin I (High Sensitivity): 61 ng/L — ABNORMAL HIGH (ref <18)

## 2020-11-07 LAB — PROTIME-INR
INR: 1 (ref 0.8–1.2)
Prothrombin Time: 12.5 seconds (ref 11.4–15.2)

## 2020-11-07 LAB — CBG MONITORING, ED
Glucose-Capillary: 117 mg/dL — ABNORMAL HIGH (ref 70–99)
Glucose-Capillary: 112 mg/dL — ABNORMAL HIGH (ref 70–99)

## 2020-11-07 LAB — RESP PANEL BY RT-PCR (FLU A&B, COVID) ARPGX2
Influenza A by PCR: NEGATIVE
Influenza B by PCR: NEGATIVE
SARS Coronavirus 2 by RT PCR: NEGATIVE

## 2020-11-07 LAB — APTT: aPTT: 31 seconds (ref 24–36)

## 2020-11-07 LAB — HEPARIN LEVEL (UNFRACTIONATED): Heparin Unfractionated: 0.58 IU/mL (ref 0.30–0.70)

## 2020-11-07 LAB — GLUCOSE, CAPILLARY
Glucose-Capillary: 206 mg/dL — ABNORMAL HIGH (ref 70–99)
Glucose-Capillary: 141 mg/dL — ABNORMAL HIGH (ref 70–99)

## 2020-11-07 LAB — MRSA PCR SCREENING: MRSA by PCR: NEGATIVE

## 2020-11-07 MED ORDER — ACETAMINOPHEN 325 MG PO TABS: 650.0000 mg | ORAL_TABLET | Freq: Four times a day (QID) | ORAL | Status: DC | PRN

## 2020-11-07 MED ORDER — GABAPENTIN 100 MG PO CAPS
100.0000 mg | ORAL_CAPSULE | Freq: Every day | ORAL | Status: DC
  Administered 2020-11-07 – 2020-11-08 (×2): 100 mg via ORAL
  Filled 2020-11-07 (×2): qty 1

## 2020-11-07 MED ORDER — ATORVASTATIN CALCIUM 20 MG PO TABS
40.0000 mg | ORAL_TABLET | Freq: Every evening | ORAL | Status: DC
  Administered 2020-11-07 – 2020-11-08 (×2): 40 mg via ORAL
  Filled 2020-11-07 (×2): qty 2

## 2020-11-07 MED ORDER — HEPARIN SODIUM (PORCINE) 5000 UNIT/ML IJ SOLN
4000.0000 [IU] | Freq: Once | INTRAMUSCULAR | Status: AC
  Administered 2020-11-07: 4000 [IU] via INTRAVENOUS
  Filled 2020-11-07: qty 1

## 2020-11-07 MED ORDER — INFLUENZA VAC A&B SA ADJ QUAD 0.5 ML IM PRSY
0.5000 mL | PREFILLED_SYRINGE | INTRAMUSCULAR | Status: AC
  Administered 2020-11-08: 0.5 mL via INTRAMUSCULAR
  Filled 2020-11-07: qty 0.5

## 2020-11-07 MED ORDER — SODIUM CHLORIDE 0.9 % IV SOLN: 250.0000 mL | INTRAVENOUS | Status: DC | PRN

## 2020-11-07 MED ORDER — SODIUM CHLORIDE 0.9 % IV BOLUS
1000.0000 mL | Freq: Once | INTRAVENOUS | Status: AC
  Administered 2020-11-07: 1000 mL via INTRAVENOUS

## 2020-11-07 MED ORDER — SODIUM CHLORIDE 0.9% FLUSH
3.0000 mL | INTRAVENOUS | Status: DC | PRN
  Administered 2020-11-07: 3 mL via INTRAVENOUS

## 2020-11-07 MED ORDER — IOHEXOL 350 MG/ML SOLN
60.0000 mL | Freq: Once | INTRAVENOUS | Status: AC | PRN
  Administered 2020-11-07: 60 mL via INTRAVENOUS

## 2020-11-07 MED ORDER — AMLODIPINE BESYLATE 5 MG PO TABS
5.0000 mg | ORAL_TABLET | Freq: Every day | ORAL | Status: DC
  Administered 2020-11-07 – 2020-11-09 (×2): 5 mg via ORAL
  Filled 2020-11-07 (×2): qty 1

## 2020-11-07 MED ORDER — MORPHINE SULFATE (PF) 2 MG/ML IV SOLN
2.0000 mg | Freq: Once | INTRAVENOUS | Status: AC
  Administered 2020-11-07: 2 mg via INTRAVENOUS
  Filled 2020-11-07: qty 1

## 2020-11-07 MED ORDER — FERROUS SULFATE 325 (65 FE) MG PO TABS
325.0000 mg | ORAL_TABLET | Freq: Every day | ORAL | Status: DC
  Administered 2020-11-07 – 2020-11-08 (×2): 325 mg via ORAL
  Filled 2020-11-07 (×2): qty 1

## 2020-11-07 MED ORDER — HEPARIN (PORCINE) 25000 UT/250ML-% IV SOLN
950.0000 [IU]/h | INTRAVENOUS | Status: DC
  Administered 2020-11-07 – 2020-11-08 (×2): 950 [IU]/h via INTRAVENOUS
  Filled 2020-11-07 (×2): qty 250

## 2020-11-07 MED ORDER — ALBUTEROL SULFATE (2.5 MG/3ML) 0.083% IN NEBU: 2.5000 mg | INHALATION_SOLUTION | RESPIRATORY_TRACT | Status: DC | PRN

## 2020-11-07 MED ORDER — INSULIN ASPART 100 UNIT/ML ~~LOC~~ SOLN
0.0000 [IU] | Freq: Three times a day (TID) | SUBCUTANEOUS | Status: DC
  Administered 2020-11-07: 3 [IU] via SUBCUTANEOUS
  Administered 2020-11-09: 1 [IU] via SUBCUTANEOUS
  Filled 2020-11-07 (×2): qty 1

## 2020-11-07 MED ORDER — CITALOPRAM HYDROBROMIDE 20 MG PO TABS
20.0000 mg | ORAL_TABLET | Freq: Every evening | ORAL | Status: DC
  Administered 2020-11-07 – 2020-11-08 (×2): 20 mg via ORAL
  Filled 2020-11-07 (×2): qty 1

## 2020-11-07 MED ORDER — DM-GUAIFENESIN ER 30-600 MG PO TB12: 1.0000 | ORAL_TABLET | Freq: Two times a day (BID) | ORAL | Status: DC | PRN

## 2020-11-07 MED ORDER — MORPHINE SULFATE (PF) 2 MG/ML IV SOLN
0.5000 mg | INTRAVENOUS | Status: DC | PRN
  Administered 2020-11-07 (×2): 0.5 mg via INTRAVENOUS
  Filled 2020-11-07 (×2): qty 1

## 2020-11-07 MED ORDER — LOSARTAN POTASSIUM 50 MG PO TABS
100.0000 mg | ORAL_TABLET | Freq: Every evening | ORAL | Status: DC
  Administered 2020-11-07 – 2020-11-08 (×2): 100 mg via ORAL
  Filled 2020-11-07 (×2): qty 2

## 2020-11-07 MED ORDER — SODIUM CHLORIDE 0.9 % IV SOLN: INTRAVENOUS | Status: DC

## 2020-11-07 MED ORDER — HYDRALAZINE HCL 20 MG/ML IJ SOLN: 5.0000 mg | INTRAMUSCULAR | Status: DC | PRN

## 2020-11-07 MED ORDER — PANTOPRAZOLE SODIUM 40 MG PO TBEC
40.0000 mg | DELAYED_RELEASE_TABLET | Freq: Every day | ORAL | Status: DC
  Administered 2020-11-07 – 2020-11-09 (×2): 40 mg via ORAL
  Filled 2020-11-07 (×2): qty 1

## 2020-11-07 MED ORDER — ASPIRIN EC 81 MG PO TBEC
81.0000 mg | DELAYED_RELEASE_TABLET | Freq: Every evening | ORAL | Status: DC
  Administered 2020-11-07 – 2020-11-08 (×2): 81 mg via ORAL
  Filled 2020-11-07 (×3): qty 1

## 2020-11-07 MED ORDER — ONDANSETRON HCL 4 MG/2ML IJ SOLN
4.0000 mg | Freq: Once | INTRAMUSCULAR | Status: AC
  Administered 2020-11-07: 4 mg via INTRAVENOUS
  Filled 2020-11-07: qty 2

## 2020-11-07 MED ORDER — NITROGLYCERIN 0.4 MG SL SUBL: 0.4000 mg | SUBLINGUAL_TABLET | SUBLINGUAL | Status: DC | PRN

## 2020-11-07 MED ORDER — FEBUXOSTAT 40 MG PO TABS
80.0000 mg | ORAL_TABLET | Freq: Every evening | ORAL | Status: DC
  Administered 2020-11-07 – 2020-11-08 (×2): 80 mg via ORAL
  Filled 2020-11-07 (×3): qty 2

## 2020-11-07 MED ORDER — SODIUM CHLORIDE 0.9% FLUSH
3.0000 mL | Freq: Two times a day (BID) | INTRAVENOUS | Status: DC
  Administered 2020-11-07: 3 mL via INTRAVENOUS

## 2020-11-07 MED ORDER — INSULIN ASPART 100 UNIT/ML ~~LOC~~ SOLN: 0.0000 [IU] | Freq: Every day | SUBCUTANEOUS | Status: DC

## 2020-11-07 MED ORDER — ONDANSETRON HCL 4 MG/2ML IJ SOLN: 4.0000 mg | Freq: Three times a day (TID) | INTRAMUSCULAR | Status: DC | PRN

## 2020-11-07 MED ORDER — ALBUTEROL SULFATE HFA 108 (90 BASE) MCG/ACT IN AERS: 2.0000 | INHALATION_SPRAY | RESPIRATORY_TRACT | Status: DC | PRN

## 2020-11-07 MED ORDER — DICLOFENAC SODIUM 1 % EX GEL
4.0000 g | Freq: Four times a day (QID) | CUTANEOUS | Status: DC
  Administered 2020-11-08 – 2020-11-09 (×4): 4 g via TOPICAL
  Filled 2020-11-07 (×2): qty 100

## 2020-11-07 NOTE — Progress Notes (Signed)
    We received notification from ED staff that patient had discussed her treatment plan with another daughter.  At this time patient and both daughters request the patient undergo diagnostic LHC on 11/08/2020 instead of a Lexiscan MPI.  Risks and benefits of cardiac catheterization have been discussed with the patient including risks of bleeding, bruising, infection, kidney damage, stroke, heart attack, urgent need for bypass surgery, injury to limb and death. The patient understands these risks and is willing to proceed with the procedure. All questions have been answered and concerns listened to.  N.p.o. at midnight.

## 2020-11-07 NOTE — Progress Notes (Signed)
Patient to CT.

## 2020-11-07 NOTE — Progress Notes (Signed)
*  PRELIMINARY RESULTS* Echocardiogram 2D Echocardiogram has been performed.  Christie Copley M Aide [place] 11/07/2020, 12:27 PM 

## 2020-11-07 NOTE — H&P (View-Only) (Signed)
Cardiology Consultation:   Patient ID: Lindsey Shaw; [id number]; 11/02/1941   Admit date: 11/07/2020 Date of Consult: 11/07/2020  Primary Care Provider: Kirk Ruths, MD Primary Cardiologist: Rockey Situ (last seen as an outpatient 02/2017) Primary Electrophysiologist:  None   Patient Profile:   Lindsey Shaw is a 79 y.o. female with a hx of CKD stage III, DM2, HTN, HLD, left lower extremity DVT in the setting of a fall, and GERD who is being seen today for the evaluation of elevated troponin at the request of Dr. Blaine Hamper.  History of Present Illness:   [name] was previously evaluated by Dr. Clayborn Bigness for preoperative evaluation for back surgery.  She was noted to have an abnormal EKG at that time.  Recommendation was to proceed with surgery and undergo stress testing thereafter.  Subsequent stress test in 2013 showed no evidence of ischemia.  Following this, she was evaluated by Dr. Rockey Situ in 02/2017 for preoperative evaluation for a right knee replacement.  She has been lost to follow-up since.  More recently, she was seen by orthopedic surgery in 06/2020 following a mechanical fall going up the stairs in which she fell backwards landing on her left shoulder leading to a nondisplaced left humeral fracture which has been followed by orthopedics.  She was seen by an urgent care in late 09/2020 with bilateral lower extremity swelling with ultrasound being negative for DVT with symptoms felt to be most likely related to venous stasis.  Earlier this month she suffered a mechanical fall tripping on her dog landing on her left side injuring the left side of her rib cage and causing a left wrist fracture.  She presented to [facility] ED late on 11/22 after developing sharp left-sided chest pain that was underneath her left breast and associated with SOB.  She indicates she has been with sharp left-sided chest discomfort stemming from her fall that occurred last week.  On the evening of  11/22, she was woken up around 10 PM with sudden onset of sharp left-sided chest pain underneath her left breast with associated shortness of breath.  This pain felt different than the discomfort she had experienced previously following her fall.  Leading up to the development of this pain she denied any exertional symptoms or change in her functional status.  Due to persisting symptoms she was brought to [facility] ED.  Upon the patient's arrival to [facility] they were found to have elevated BP in the 235T to 614E systolic, temp afebrile, oxygen saturation 94-100% on room air. EKG showed NSR, 90 bpm, left axis deviation, LVH, poor R wave progression along the precordial leads, baseline wanderingg, nonspecific st/t changes. CXR showed probable bronchiectasis without focal consolidation as well as a possible nondisplaced fracture of the left humeral neck. CTA chest showed no evidence of PE. CT head showed no acute intracranial abnormality. Dedicated humerus xray showed no evidence of fracture. Labs showed HS-Tn 61 with a delta troponin of 286 trending to 430, Covid negative, BUN 26, SCr 1.25, potassium 4.0, magnesium 1.8, WBC 7.1, HGB 11.2, PLT 240. They were started on a heparin gtt. Currently, she continued to note sharp 10/10 chest pain with positional and rotational movement, otherwise she is without pain.    Past Medical History:  Diagnosis Date  . Anxiety   . Arthritis   . Cancer ([place]) 2005, 2010   MELANOMA   . Chronic kidney disease    Stage 3 Kidney Disease  . Depression   . Diabetes  mellitus without complication ([place])   . DVT (deep venous thrombosis) ([place]) 2006   from fall  . GERD (gastroesophageal reflux disease)   . Gout   . History of stress test    10+ YRS (Lexiscan: no evid of ischemia/infarct, EF 72% 06/23/12 [facility])  . Hyperlipidemia   . Hypertension   . Mental disorder     Past Surgical History:  Procedure Laterality Date  . APPENDECTOMY  1967  . BACK SURGERY     2007  . CATARACT  EXTRACTION W/PHACO Left 11/05/2016   Procedure: CATARACT EXTRACTION PHACO AND INTRAOCULAR LENS PLACEMENT (IOC);  Surgeon: Birder Robson, MD;  Location: [facility] ORS;  Service: Ophthalmology;  Laterality: Left;  Lot # [id number] H [place]: 01:16.4 AP%:24.0 CDE: 18.29  . CATARACT EXTRACTION W/PHACO Right 11/26/2016   Procedure: CATARACT EXTRACTION PHACO AND INTRAOCULAR LENS PLACEMENT (IOC);  Surgeon: Birder Robson, MD;  Location: [facility] ORS;  Service: Ophthalmology;  Laterality: Right;  [place] 48.1 AP% 15.4 CDE 7.36 Fluid pack lot # [id number]  . CHOLECYSTECTOMY  1967  . EYE SURGERY    . INGUINAL HERNIA REPAIR Right 04/06/2018   Procedure: LAPAROSCOPIC INGUINAL HERNIA;  Surgeon: Robert Bellow, MD;  Location: [facility] ORS;  Service: General;  Laterality: Right;  . KYPHOPLASTY N/A 11/21/2015   Procedure: KYPHOPLASTY THORACIC 12;  Surgeon: Hessie Knows, MD;  Location: [facility] ORS;  Service: Orthopedics;  Laterality: N/A;  . LUMBAR LAMINECTOMY/DECOMPRESSION MICRODISCECTOMY Right 06/28/2013   Procedure: LUMBAR LAMINECTOMY/DECOMPRESSION MICRODISCECTOMY 1 LEVEL;  Surgeon: Elaina Hoops, MD;  Location: [place] [facility]UR[facility] ORS;  Service: Neurosurgery;  Laterality: Right;  Lumbar Laminectomy Decompression Lumbar Four-Five Right  . TOE SURGERY Bilateral    BIL GREAT TOE JOINT REMOVED  . TOTAL KNEE ARTHROPLASTY Right 04/09/2017  . TOTAL KNEE ARTHROPLASTY Right 04/09/2017   Procedure: RIGHT TOTAL KNEE ARTHROPLASTY;  Surgeon: Ninetta Lights, MD;  Location: [place];  Service: Orthopedics;  Laterality: Right;  . TUBAL LIGATION     73     Home Meds: Prior to Admission medications   Medication Sig Start Date End Date Taking? Authorizing Provider  amLODipine (NORVASC) 5 MG tablet Take 5 mg by mouth daily. 09/14/20  Yes [provider]  aspirin EC 81 MG tablet Take 81 mg by mouth every evening.   Yes [provider]  atorvastatin (LIPITOR) 40 MG tablet Take 40 mg by mouth every evening. 08/01/16  Yes [provider]   citalopram (CELEXA) 20 MG tablet Take 20 mg by mouth every evening.   Yes [provider]  diclofenac Sodium (VOLTAREN) 1 % GEL Apply 4 g topically 4 (four) times daily.   Yes [provider]  Febuxostat (ULORIC) 80 MG TABS Take 80 mg by mouth every evening.   Yes [provider]  ferrous sulfate 325 (65 FE) MG tablet Take 325 mg by mouth daily before supper.   Yes [provider]  gabapentin (NEURONTIN) 100 MG capsule Take 100 mg by mouth at bedtime.  09/12/20  Yes [provider]  losartan (COZAAR) 100 MG tablet Take 100 mg by mouth every evening.    Yes [provider]  metFORMIN (GLUCOPHAGE-XR) 500 MG 24 hr tablet Take 500 mg by mouth daily before supper.   Yes [provider]  omeprazole (PRILOSEC) 20 MG capsule Take 20 mg by mouth every evening.   Yes [provider]  acetaminophen (TYLENOL) 500 MG tablet Take 500-1,000 mg by mouth every 6 (six) hours as needed (for pain.). Patient not taking: Reported  on 11/07/2020    [provider]  cyanocobalamin 1000 MCG tablet Take 1,000 mcg by mouth daily. Patient not taking: Reported on 11/07/2020    [provider]    Inpatient Medications: Scheduled Meds: . amLODipine  5 mg Oral Daily  . aspirin EC  81 mg Oral QPM  . atorvastatin  40 mg Oral QPM  . citalopram  20 mg Oral QPM  . diclofenac Sodium  4 g Topical QID  . febuxostat  80 mg Oral QPM  . ferrous sulfate  325 mg Oral QAC supper  . gabapentin  100 mg Oral QHS  . insulin aspart  0-5 Units Subcutaneous QHS  . insulin aspart  0-9 Units Subcutaneous TID WC  . losartan  100 mg Oral QPM  . pantoprazole  40 mg Oral Daily   Continuous Infusions: . sodium chloride 50 mL/hr at 11/07/20 1107  . heparin 950 Units/hr (11/07/20 0537)   PRN Meds: acetaminophen, albuterol, dextromethorphan-guaiFENesin, hydrALAZINE, morphine injection, nitroGLYCERIN, ondansetron (ZOFRAN) IV  Allergies:   Allergies   Allergen Reactions  . Keflex [Cephalexin] Swelling    SWELLING REACTION UNSPECIFIED     Social History:   Social History   Socioeconomic History  . Marital status: Widowed    Spouse name: Not on file  . Number of children: Not on file  . Years of education: Not on file  . Highest education level: Not on file  Occupational History  . Not on file  Tobacco Use  . Smoking status: Never Smoker  . Smokeless tobacco: Never Used  Vaping Use  . Vaping Use: Never used  Substance and Sexual Activity  . Alcohol use: No  . Drug use: No  . Sexual activity: Not on file  Other Topics Concern  . Not on file  Social History Narrative  . Not on file   Social Determinants of Health   Financial Resource Strain:   . Difficulty of Paying Living Expenses: Not on file  Food Insecurity:   . Worried About Charity fundraiser in the Last Year: Not on file  . Ran Out of Food in the Last Year: Not on file  Transportation Needs:   . Lack of Transportation (Medical): Not on file  . Lack of Transportation (Non-Medical): Not on file  Physical Activity:   . Days of Exercise per Week: Not on file  . Minutes of Exercise per Session: Not on file  Stress:   . Feeling of Stress : Not on file  Social Connections:   . Frequency of Communication with Friends and Family: Not on file  . Frequency of Social Gatherings with Friends and Family: Not on file  . Attends Religious Services: Not on file  . Active Member of Clubs or Organizations: Not on file  . Attends Archivist Meetings: Not on file  . Marital Status: Not on file  Intimate Partner Violence:   . Fear of Current or Ex-Partner: Not on file  . Emotionally Abused: Not on file  . Physically Abused: Not on file  . Sexually Abused: Not on file     Family History:   Family History  Problem Relation Age of Onset  . Ovarian cancer Mother   . Pancreatic cancer Mother   . Lung cancer Father   . AAA (abdominal aortic aneurysm) Paternal  Aunt   . Breast cancer Maternal Aunt        60's  . Breast cancer Maternal Uncle  60's  . Breast cancer Cousin        1 cousin-maternal side    ROS:  Review of Systems  Constitutional: Positive for malaise/fatigue. Negative for chills, diaphoresis, fever and weight loss.  HENT: Negative for congestion.   Eyes: Negative for discharge and redness.  Respiratory: Positive for shortness of breath. Negative for cough, hemoptysis, sputum production and wheezing.   Cardiovascular: Positive for chest pain. Negative for palpitations, orthopnea, claudication, leg swelling and PND.  Gastrointestinal: Negative for abdominal pain, blood in stool, heartburn, melena, nausea and vomiting.  Genitourinary: Negative for hematuria.  Musculoskeletal: Positive for falls and joint pain. Negative for myalgias.  Skin: Negative for rash.  Neurological: Positive for weakness. Negative for dizziness, tingling, tremors, sensory change, speech change, focal weakness and loss of consciousness.  Endo/Heme/Allergies: Does not bruise/bleed easily.  Psychiatric/Behavioral: Negative for substance abuse. The patient is not nervous/anxious.   All other systems reviewed and are negative.     Physical Exam/Data:   Vitals:   11/07/20 0700 11/07/20 0730 11/07/20 0800 11/07/20 0830  BP: (!) 163/83 (!) 173/80 (!) 189/111 (!) 165/79  Pulse: (!) 56 62 83 63  Resp: 13 12 (!) 24 14  Temp:      TempSrc:      SpO2: 97% 97% 99% 98%  Weight:      Height:       No intake or output data in the 24 hours ending 11/07/20 1331 Filed Weights   11/06/20 2353  Weight: 68 kg   Body mass index is 24.96 kg/m.   Physical Exam: General: Well developed, well nourished, in no acute distress. Head: Normocephalic, atraumatic, sclera non-icteric, no xanthomas, nares without discharge.  Neck: Negative for carotid bruits. JVD not elevated. Lungs: Clear bilaterally to auscultation without wheezes, rales, or rhonchi. Breathing is  unlabored. Heart: RRR with S1 S2. No murmurs, rubs, or gallops appreciated. Chest pain is reproducible with rotational and positional movement as well as to palpation on exam. Abdomen: Soft, non-tender, non-distended with normoactive bowel sounds. No hepatomegaly. No rebound/guarding. No obvious abdominal masses. Msk:  Strength and tone appear normal for age. Extremities: No clubbing or cyanosis. No edema. Distal pedal pulses are 2+ and equal bilaterally. Brace noted on the left wrist.  Neuro: Alert and oriented X 3. No facial asymmetry. No focal deficit. Moves all extremities spontaneously. Psych:  Responds to questions appropriately with a normal affect.   EKG:  The EKG was personally reviewed and demonstrates: NSR, 90 bpm, left axis deviation, LVH, poor R wave progression along the precordial leads, baseline wanderingg, nonspecific st/t changes Telemetry:  Telemetry was personally reviewed and demonstrates: SR with artifact   Weights: Filed Weights   11/06/20 2353  Weight: 68 kg    Relevant CV Studies:  2D echo 11/07/2020: 1. Left ventricular ejection fraction, by estimation, is 55 to 60%. The  left ventricle has normal function. Left ventricular endocardial border  not optimally defined to evaluate regional wall motion. Left ventricular  diastolic parameters are consistent  with Grade I diastolic dysfunction (impaired relaxation).  2. Right ventricular systolic function is normal. The right ventricular  size is normal. Tricuspid regurgitation signal is inadequate for assessing  PA pressure.  3. The mitral valve was not well visualized. Trivial mitral valve  regurgitation. No evidence of mitral stenosis.  4. The aortic valve was not well visualized. Aortic valve regurgitation  is not visualized. No aortic stenosis is present.  Laboratory Data:  Chemistry Recent Labs  Lab 11/06/20 2356  NA 136  K 4.0  CL 103  CO2 23  GLUCOSE 164*  BUN 26*  CREATININE 1.25*  CALCIUM  9.4  GFRNONAA 44*  ANIONGAP 10    Recent Labs  Lab 11/06/20 2356  PROT 7.7  ALBUMIN 4.1  AST 18  ALT 11  ALKPHOS 94  BILITOT 0.7   Hematology Recent Labs  Lab 11/06/20 2356  WBC 7.1  RBC 3.61*  HGB 11.2*  HCT 34.3*  MCV 95.0  MCH 31.0  MCHC 32.7  RDW 12.7  PLT 240   Cardiac EnzymesNo results for input(s): TROPONINI in the last 168 hours. No results for input(s): TROPIPOC in the last 168 hours.  BNPNo results for input(s): BNP, PROBNP in the last 168 hours.  DDimer No results for input(s): DDIMER in the last 168 hours.  Radiology/Studies:  DG Chest 2 View  Result Date: 11/07/2020 IMPRESSION: 1. Probable bronchiectasis and bronchial wall thickening in the upper lungs. No focal consolidation. 2. Possible nondisplaced fracture of the left humeral neck. Electronically Signed   By: Lucienne Capers M.D.   On: 11/07/2020 00:25   CT HEAD WO CONTRAST  Result Date: 11/07/2020 IMPRESSION: Chronic atrophic and ischemic changes stable from the prior exam. No acute abnormality noted. Electronically Signed   By: Inez Catalina M.D.   On: 11/07/2020 09:42   CT Angio Chest PE W/Cm &/Or Wo Cm  Result Date: 11/07/2020 IMPRESSION: 1. No evidence of significant pulmonary embolus. 2. The lungs are clear. Electronically Signed   By: Lucienne Capers M.D.   On: 11/07/2020 04:36   DG Humerus Left  Result Date: 11/07/2020 IMPRESSION: No acute fracture or dislocation of the left humerus. Electronically Signed   By: Ulyses Jarred M.D.   On: 11/07/2020 03:40    Assessment and Plan:   1.  NSTEMI with stuttering chest pain: -Overall, her chest pain features are quite atypical in that they are present after sustaining a mechanical fall to the outside, nonexertional, worse with positional movement/rotation, and reproducible to palpation on exam -Currently, she continues to note 10 out of 10 chest pain with movement -She does have some underlying CAD risk factors including coronary artery  calcification on noninvasive imaging, DM, HTN, and HLD -EKG without acute ischemic changes and echo with preserved LVSF and no obvious WMA -HS-Tn currently 430, continue to cycle until peak -As long as her troponin remains relatively flat trending we will plan for Lexiscan MPI on 11/24 -If there is dynamic elevation in her high-sensitivity troponin stress testing will need to be canceled and she will need to undergo diagnostic LHC at that time -ASA -Heparin gtt can be continued for now -Echo as above -Lipid and A1c for further risk stratification -N.p.o. at midnight  2. CKD stage III: -Renal function appears to be at her approximate baseline -Monitor   3. HTN: -Blood pressure mildly elevated -She remains on amlodipine and losartan -If needed, amlodipine can be titrated to 10 mg  4. HLD: -Lipids pending -PTA Lipitor  5. Falls: -Multiple falls recently  -Consider PT/OT evaluation -Management per IM    For questions or updates, please contact [place] Please consult [email or web address] for contact info under Cardiology/STEMI.   Signed, Christell Faith, PA-C [place] Pager: [phone] 11/07/2020, 1:31 PM

## 2020-11-07 NOTE — ED Notes (Signed)
purwick placed on pt 

## 2020-11-07 NOTE — ED Notes (Signed)
Pt ambulatory to restroom

## 2020-11-07 NOTE — Progress Notes (Signed)
[place] for Heparin Indication: chest pain/ACS  Allergies  Allergen Reactions  . Keflex [Cephalexin] Swelling    SWELLING REACTION UNSPECIFIED     Patient Measurements: Height: 5\' 5"  (165.1 cm) Weight: 68 kg (150 lb) IBW/kg (Calculated) : 57 HEPARIN DW (KG): 68  Vital Signs: Temp: 98.9 F (37.2 C) (11/23 1129) Temp Source: Oral (11/23 1129) BP: 168/77 (11/23 1129) Pulse Rate: 75 (11/23 1129)  Labs: Recent Labs    11/06/20 2356 11/07/20 0238 11/07/20 0527 11/07/20 0818 11/07/20 1330  HGB 11.2*  --   --   --  10.9*  HCT 34.3*  --   --   --  33.2*  PLT 240  --   --   --  219  APTT  --   --  31  --   --   LABPROT  --   --  12.5  --   --   INR  --   --  1.0  --   --   HEPARINUNFRC  --   --   --   --  0.58  CREATININE 1.25*  --   --   --   --   TROPONINIHS 61* 286*  --  430*  --     Estimated Creatinine Clearance: 33.9 mL/min (A) (by C-G formula based on SCr of 1.25 mg/dL (H)).   Medical History: Past Medical History:  Diagnosis Date  . Anxiety   . Arthritis   . Cancer ([place]) 2005, 2010   MELANOMA   . Chronic kidney disease    Stage 3 Kidney Disease  . Depression   . Diabetes mellitus without complication ([place])   . DVT (deep venous thrombosis) ([place]) 2006   from fall  . GERD (gastroesophageal reflux disease)   . Gout   . History of stress test    10+ YRS (Lexiscan: no evid of ischemia/infarct, EF 72% 06/23/12 [facility])  . Hyperlipidemia   . Hypertension   . Mental disorder     Medications:  Medications Prior to Admission  Medication Sig Dispense Refill Last Dose  . amLODipine (NORVASC) 5 MG tablet Take 5 mg by mouth daily.   11/06/2020 at Unknown time  . aspirin EC 81 MG tablet Take 81 mg by mouth every evening.   11/06/2020 at Unknown time  . atorvastatin (LIPITOR) 40 MG tablet Take 40 mg by mouth every evening.   11/06/2020 at Unknown time  . citalopram (CELEXA) 20 MG tablet Take 20 mg by mouth every evening.    11/06/2020 at Unknown time  . diclofenac Sodium (VOLTAREN) 1 % GEL Apply 4 g topically 4 (four) times daily.   11/06/2020 at Unknown time  . Febuxostat (ULORIC) 80 MG TABS Take 80 mg by mouth every evening.   11/06/2020 at Unknown time  . ferrous sulfate 325 (65 FE) MG tablet Take 325 mg by mouth daily before supper.   11/06/2020 at Unknown time  . gabapentin (NEURONTIN) 100 MG capsule Take 100 mg by mouth at bedtime.    11/06/2020 at Unknown time  . losartan (COZAAR) 100 MG tablet Take 100 mg by mouth every evening.    11/06/2020 at Unknown time  . metFORMIN (GLUCOPHAGE-XR) 500 MG 24 hr tablet Take 500 mg by mouth daily before supper.   11/06/2020 at Unknown time  . omeprazole (PRILOSEC) 20 MG capsule Take 20 mg by mouth every evening.   11/06/2020 at Unknown time  . acetaminophen (TYLENOL) 500 MG tablet Take 500-1,000  mg by mouth every 6 (six) hours as needed (for pain.). (Patient not taking: Reported on 11/07/2020)   Not Taking at Unknown time  . cyanocobalamin 1000 MCG tablet Take 1,000 mcg by mouth daily. (Patient not taking: Reported on 11/07/2020)   Not Taking at Unknown time    Assessment: Heparin for ACS.  No anticoagulants PTA per med list.   11/23 1330 HL 0.58   Goal of Therapy:  Heparin level 0.3-0.7 units/ml Monitor platelets by anticoagulation protocol: Yes   Plan:  Heparin level is therapeutic. Will continue heparin infusion at 950 units/hr. Recheck heparin level in 8 hours. CBC daily while on heparin.   Oswald Hillock, PharmD, BCPS 11/07/2020,2:57 PM

## 2020-11-07 NOTE — H&P (Addendum)
History and Physical    [name] IRJ:[id number] DOB: [birth year] DOA: 11/07/2020  Referring MD/NP/PA:   PCP: Kirk Ruths, MD   Patient coming from:  The patient is coming from home.  At baseline, pt is independent for most of ADL.        Chief Complaint: chest pain  HPI: Lindsey Shaw is a 79 y.o. female with medical history significant of hypertension, hyperlipidemia, diabetes mellitus, GERD, gout, depression, CAD, myocardial infarction, DVT not on anticoagulants, CKD stage III, iron deficiency anemia, who presents with chest pain.  Patient states that her chest pain started last night, which is located in left side of chest, initially 10 out of 10 severity, currently chest pain-free, sharp, nonradiating.  Patient had some shortness of breath initially, which has resolved.  Currently patient does not have shortness breath, cough, fever or chills.  Denies nausea vomiting, diarrhea, abdominal pain, symptoms of UTI or unilateral weakness.  Of note, patient states that she fell last week, and injured her left chest wall and left arm, causing left wrist fracture.  Patient is wearing braces in the left wrist.  She has appointment with orthopedic surgeon for follow-up.  ED Course: pt was found to have trop 61-->286, negative Covid PCR, WBC 7.1, INR 1.0, PTT 31, stable renal function, temperature normal, blood pressure 134/67, heart rate 54, RR 20, oxygen saturation 93-100% on room air.  Chest x-ray showed bronchitis change with possible bronchiectasis.  X-ray of left humerus negative for fracture.  CT angiogram is negative for PE.  Patient is admitted to progressive bed as inpatient.  Dr. Saunders Revel of cardiology is consulted.  Review of Systems:   General: no fevers, chills, no body weight gain, has fatigue HEENT: no blurry vision, hearing changes or sore throat Respiratory: no dyspnea, coughing, wheezing CV: has chest pain, no palpitations GI: no nausea, vomiting, abdominal  pain, diarrhea, constipation GU: no dysuria, burning on urination, increased urinary frequency, hematuria  Ext: has trace leg edema Neuro: no unilateral weakness, numbness, or tingling, no vision change or hearing loss. Had fall. Skin: no rash, no skin tear. MSK: No muscle spasm, no deformity, no limitation of range of movement in spin. Has left wrist pain. Heme: No easy bruising.  Travel history: No recent long distant travel.  Allergy:  Allergies  Allergen Reactions  . Keflex [Cephalexin] Swelling    SWELLING REACTION UNSPECIFIED     Past Medical History:  Diagnosis Date  . Anxiety   . Arthritis   . Blood clotting tendency ([place])    2006   BLOOD CLOT LEG TO GROIN/ post fall  . Cancer ([place]) 2005, 2010   MELANOMA   . Chronic kidney disease    Stage 3 Kidney Disease  . Depression   . Diabetes mellitus without complication ([place])   . DVT (deep venous thrombosis) ([place]) 2006   from fall  . 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[place]) 2006   Left leg  . GERD (gastroesophageal reflux disease)   . Gout   . Hyperlipidemia   . Hypertension   . Mental disorder   . Myocardial infarction ([place])    10+ YRS (Lexiscan: no evid of ischemia/infarct, EF 72% 06/23/12 [facility])    Past Surgical History:  Procedure Laterality Date  . APPENDECTOMY  1967  . BACK SURGERY     2007  . CATARACT EXTRACTION W/PHACO Left 11/05/2016   Procedure: CATARACT EXTRACTION PHACO AND INTRAOCULAR LENS PLACEMENT (IOC);  Surgeon: Birder Robson, MD;  Location: [facility] ORS;  Service: Ophthalmology;  Laterality: Left;  Lot # [id number] H [place]: 01:16.4 AP%:24.0 CDE: 18.29  . CATARACT EXTRACTION W/PHACO Right 11/26/2016   Procedure: CATARACT EXTRACTION PHACO AND INTRAOCULAR LENS PLACEMENT (IOC);  Surgeon: Birder Robson, MD;  Location: [facility];  Service: Ophthalmology;  Laterality: Right;  [place] 48.1 AP% 15.4 CDE 7.36 Fluid pack lot # [id number]  . CHOLECYSTECTOMY  1967  . EYE SURGERY    . INGUINAL HERNIA REPAIR Right 04/06/2018    Procedure: LAPAROSCOPIC INGUINAL HERNIA;  Surgeon: Robert Bellow, MD;  Location: [facility] OR[facility];  Service: General;  Laterality: Right;  . KYPHOPLASTY N/A 11/21/2015   Procedure: KYPHOPLASTY THORACIC 12;  Surgeon: Hessie Knows, MD;  Location: [facility];  Service: Orthopedics;  Laterality: N/A;  . LUMBAR LAMINECTOMY/DECOMPRESSION MICRODISCECTOMY Right 06/28/2013   Procedure: LUMBAR LAMINECTOMY/DECOMPRESSION MICRODISCECTOMY 1 LEVEL;  Surgeon: Elaina Hoops, MD;  Location: [place] [facility];  Service: Neurosurgery;  Laterality: Right;  Lumbar Laminectomy Decompression Lumbar Four-Five Right  . TOE SURGERY Bilateral    BIL GREAT TOE JOINT REMOVED  . TOTAL KNEE ARTHROPLASTY Right 04/09/2017  . TOTAL KNEE ARTHROPLASTY Right 04/09/2017   Procedure: RIGHT TOTAL KNEE ARTHROPLASTY;  Surgeon: Ninetta Lights, MD;  Location: [place];  Service: Orthopedics;  Laterality: Right;  . TUBAL LIGATION     73    Social History:  reports that she has never smoked. She has never used smokeless tobacco. She reports that she does not drink alcohol and does not use drugs.  Family History:  Family History  Problem Relation Age of Onset  . Ovarian cancer Mother   . Pancreatic cancer Mother   . Lung cancer Father   . AAA (abdominal aortic aneurysm) Paternal Aunt   . Breast cancer Maternal Aunt        60's  . Breast cancer Maternal Uncle        60's  . Breast cancer Cousin        1 cousin-maternal side     Prior to Admission medications   Medication Sig Start Date End Date Taking? Authorizing Provider  amLODipine (NORVASC) 5 MG tablet Take 5 mg by mouth daily. 09/14/20  Yes [provider]  aspirin EC 81 MG tablet Take 81 mg by mouth every evening.   Yes [provider]  atorvastatin (LIPITOR) 40 MG tablet Take 40 mg by mouth every evening. 08/01/16  Yes [provider]  citalopram (CELEXA) 20 MG tablet Take 20 mg by mouth every evening.   Yes [provider]  diclofenac Sodium (VOLTAREN)  1 % GEL Apply 4 g topically 4 (four) times daily.   Yes [provider]  Febuxostat (ULORIC) 80 MG TABS Take 80 mg by mouth every evening.   Yes [provider]  ferrous sulfate 325 (65 FE) MG tablet Take 325 mg by mouth daily before supper.   Yes [provider]  gabapentin (NEURONTIN) 100 MG capsule Take 100 mg by mouth at bedtime.  09/12/20  Yes [provider]  losartan (COZAAR) 100 MG tablet Take 100 mg by mouth every evening.    Yes [provider]  metFORMIN (GLUCOPHAGE-XR) 500 MG 24 hr tablet Take 500 mg by mouth daily before supper.   Yes [provider]  omeprazole (PRILOSEC) 20 MG capsule Take 20 mg by mouth every evening.   Yes [provider]  acetaminophen (TYLENOL) 500 MG tablet Take 500-1,000 mg by mouth every 6 (six) hours as needed (for pain.). Patient not taking: Reported on 11/07/2020  [provider]  cyanocobalamin 1000 MCG tablet Take 1,000 mcg by mouth daily. Patient not taking: Reported on 11/07/2020    [provider]    Physical Exam: Vitals:   11/07/20 0700 11/07/20 0730 11/07/20 0800 11/07/20 0830  BP: (!) 163/83 (!) 173/80 (!) 189/111 (!) 165/79  Pulse: (!) 56 62 83 63  Resp: 13 12 (!) 24 14  Temp:      TempSrc:      SpO2: 97% 97% 99% 98%  Weight:      Height:       General: Not in acute distress HEENT:       Eyes: PERRL, EOMI, no scleral icterus.       ENT: No discharge from the ears and nose, no pharynx injection, no tonsillar enlargement.        Neck: No JVD, no bruit, no mass felt. Heme: No neck lymph node enlargement. Cardiac: S1/S2, RRR, No murmurs, No gallops or rubs. Respiratory: No rales, wheezing, rhonchi or rubs. GI: Soft, nondistended, nontender, no rebound pain, no organomegaly, BS present. GU: No hematuria Ext: has trace leg edema bilaterally. 1+DP/PT pulse bilaterally. Musculoskeletal: No joint deformities, No joint redness or warmth, no limitation of ROM  in spin. Wearing brace in left wrist with mild tenderness Skin: No rashes.  Neuro: Alert, oriented X3, cranial nerves II-XII grossly intact, moves all extremities normally. Psych: Patient is not psychotic, no suicidal or hemocidal ideation.  Labs on Admission: I have personally reviewed following labs and imaging studies  CBC: Recent Labs  Lab 11/06/20 2356  WBC 7.1  HGB 11.2*  HCT 34.3*  MCV 95.0  PLT 790   Basic Metabolic Panel: Recent Labs  Lab 11/06/20 2356  NA 136  K 4.0  CL 103  CO2 23  GLUCOSE 164*  BUN 26*  CREATININE 1.25*  CALCIUM 9.4  MG 1.8   GFR: Estimated Creatinine Clearance: 33.9 mL/min (A) (by C-G formula based on SCr of 1.25 mg/dL (H)). Liver Function Tests: Recent Labs  Lab 11/06/20 2356  AST 18  ALT 11  ALKPHOS 94  BILITOT 0.7  PROT 7.7  ALBUMIN 4.1   No results for input(s): LIPASE, AMYLASE in the last 168 hours. No results for input(s): AMMONIA in the last 168 hours. Coagulation Profile: Recent Labs  Lab 11/07/20 0527  INR 1.0   Cardiac Enzymes: No results for input(s): CKTOTAL, CKMB, CKMBINDEX, TROPONINI in the last 168 hours. BNP (last 3 results) No results for input(s): PROBNP in the last 8760 hours. HbA1C: No results for input(s): HGBA1C in the last 72 hours. CBG: Recent Labs  Lab 11/07/20 0812  GLUCAP 117*   Lipid Profile: No results for input(s): CHOL, HDL, LDLCALC, TRIG, CHOLHDL, LDLDIRECT in the last 72 hours. Thyroid Function Tests: No results for input(s): TSH, T4TOTAL, FREET4, T3FREE, THYROIDAB in the last 72 hours. Anemia Panel: No results for input(s): VITAMINB12, FOLATE, FERRITIN, TIBC, IRON, RETICCTPCT in the last 72 hours. Urine analysis:    Component Value Date/Time   COLORURINE YELLOW (A) 03/08/2018 0352   APPEARANCEUR CLEAR (A) 03/08/2018 0352   LABSPEC 1.010 03/08/2018 0352   PHURINE 5.0 03/08/2018 0352   GLUCOSEU NEGATIVE 03/08/2018 0352   HGBUR NEGATIVE 03/08/2018 0352   BILIRUBINUR NEGATIVE  03/08/2018 0352   KETONESUR NEGATIVE 03/08/2018 0352   PROTEINUR NEGATIVE 03/08/2018 0352   NITRITE NEGATIVE 03/08/2018 0352   LEUKOCYTESUR SMALL (A) 03/08/2018 0352   Sepsis Labs: @LABRCNTIP (procalcitonin:4,lacticidven:4) ) Recent Results (from the past 240 hour(s))  Resp Panel  by RT-PCR (Flu A&B, Covid) Nasopharyngeal Swab     Status: None   Collection Time: 11/07/20  3:35 AM   Specimen: Nasopharyngeal Swab; Nasopharyngeal(NP) swabs in vial transport medium  Result Value Ref Range Status   SARS Coronavirus 2 by RT PCR NEGATIVE NEGATIVE Final    Comment: (NOTE) SARS-CoV-2 target nucleic acids are NOT DETECTED.  The SARS-CoV-2 RNA is generally detectable in upper respiratory specimens during the acute phase of infection. The lowest concentration of SARS-CoV-2 viral copies this assay can detect is 138 copies/mL. A negative result does not preclude SARS-Cov-2 infection and should not be used as the sole basis for treatment or other patient management decisions. A negative result may occur with  improper specimen collection/handling, submission of specimen other than nasopharyngeal swab, presence of viral mutation(s) within the areas targeted by this assay, and inadequate number of viral copies(<138 copies/mL). A negative result must be combined with clinical observations, patient history, and epidemiological information. The expected result is Negative.  Fact Sheet for Patients:  EntrepreneurPulse.com.au  Fact Sheet for Healthcare Providers:  IncredibleEmployment.be  This test is no t yet approved or cleared by the [place] FDA and  has been authorized for detection and/or diagnosis of SARS-CoV-2 by FDA under an Emergency Use Authorization (EUA). This EUA will remain  in effect (meaning this test can be used) for the duration of the COVID-19 declaration under Section 564(b)(1) of the Act, 21 U.S.C.section 360bbb-3(b)(1), unless the  authorization is terminated  or revoked sooner.       Influenza A by PCR NEGATIVE NEGATIVE Final   Influenza B by PCR NEGATIVE NEGATIVE Final    Comment: (NOTE) The Xpert Xpress SARS-CoV-2/FLU/RSV plus assay is intended as an aid in the diagnosis of influenza from Nasopharyngeal swab specimens and should not be used as a sole basis for treatment. Nasal washings and aspirates are unacceptable for Xpert Xpress SARS-CoV-2/FLU/RSV testing.  Fact Sheet for Patients: EntrepreneurPulse.com.au  Fact Sheet for Healthcare Providers: IncredibleEmployment.be  This test is not yet approved or cleared by the [place] FDA and has been authorized for detection and/or diagnosis of SARS-CoV-2 by FDA under an Emergency Use Authorization (EUA). This EUA will remain in effect (meaning this test can be used) for the duration of the COVID-19 declaration under Section 564(b)(1) of the Act, 21 U.S.C. section 360bbb-3(b)(1), unless the authorization is terminated or revoked.  Performed at [facility], [address] [place], [place] [address]      Radiological Exams on Admission: DG Chest 2 View  Result Date: 11/07/2020 CLINICAL DATA:  Chest pain starting tonight under the left breast. [place] a week ago with injury to the left ribs. EXAM: CHEST - 2 VIEW COMPARISON:  06/21/2013 FINDINGS: Heart size and pulmonary vascularity are normal. Slight linear atelectasis in the left base. No airspace disease or consolidation. Suggestion of bronchiectasis and bronchial wall thickening in the upper lungs possibly indicating bronchitis. No pleural effusions. No pneumothorax. Mediastinal contours appear intact. Visualized ribs are nondepressed. There is incomplete visualization of the left humerus and degenerative changes are present in the left shoulder but there is possible linear nondisplaced fracture of the left humeral neck. Degenerative changes in the spine.  Previous vertebral kyphoplasty. IMPRESSION: 1. Probable bronchiectasis and bronchial wall thickening in the upper lungs. No focal consolidation. 2. Possible nondisplaced fracture of the left humeral neck. Electronically Signed   By: Lucienne Capers M.D.   On: 11/07/2020 00:25   CT HEAD WO CONTRAST  Result Date: 11/07/2020  CLINICAL DATA:  Recent fall with headaches, initial encounter EXAM: CT HEAD WITHOUT CONTRAST TECHNIQUE: Contiguous axial images were obtained from the base of the skull through the vertex without intravenous contrast. COMPARISON:  09/14/2020 FINDINGS: Brain: Mild atrophic changes and chronic white matter ischemic changes are seen. Prior lacunar infarct in the left basal ganglia is noted. No findings to suggest acute hemorrhage, acute infarction or space-occupying mass lesion are seen. Vascular: No hyperdense vessel or unexpected calcification. Skull: Normal. Negative for fracture or focal lesion. Sinuses/Orbits: No acute finding. Other: None. IMPRESSION: Chronic atrophic and ischemic changes stable from the prior exam. No acute abnormality noted. Electronically Signed   By: Inez Catalina M.D.   On: 11/07/2020 09:42   CT Angio Chest PE W/Cm &/Or Wo Cm  Result Date: 11/07/2020 CLINICAL DATA:  Pulmonary embolus suspected with high probability. History of deep venous thrombosis. Recent fall with chest bruising. EXAM: CT ANGIOGRAPHY CHEST WITH CONTRAST TECHNIQUE: Multidetector CT imaging of the chest was performed using the standard protocol during bolus administration of intravenous contrast. Multiplanar CT image reconstructions and MIPs were obtained to evaluate the vascular anatomy. CONTRAST:  8mL OMNIPAQUE IOHEXOL 350 MG/ML SOLN COMPARISON:  Chest radiograph 11/06/2020 FINDINGS: Cardiovascular: Motion artifact limits examination. There is good opacification of the central and segmental pulmonary arteries. No focal filling defects. No evidence of significant pulmonary embolus. Normal heart  size. No pericardial effusions. Normal caliber thoracic aorta. No aortic dissection. Great vessel origins are patent. Mediastinum/Nodes: Esophagus is decompressed. No significant lymphadenopathy. Lungs/Pleura: The lungs are clear. No pleural effusions. No pneumothorax. Airways are patent. Upper Abdomen: No acute abnormalities demonstrated in the visualized upper abdomen. Musculoskeletal: Degenerative changes in the thoracic spine. Lower thoracic vertebral kyphoplasty procedure. No destructive bone lesions. Review of the MIP images confirms the above findings. IMPRESSION: 1. No evidence of significant pulmonary embolus. 2. The lungs are clear. Electronically Signed   By: Lucienne Capers M.D.   On: 11/07/2020 04:36   DG Humerus Left  Result Date: 11/07/2020 CLINICAL DATA:  Fall EXAM: LEFT HUMERUS - 2+ VIEW COMPARISON:  None. FINDINGS: Chronic fracture of the proximal left humerus with callus formation dorsally. No dislocation or acute fracture. IMPRESSION: No acute fracture or dislocation of the left humerus. Electronically Signed   By: Ulyses Jarred M.D.   On: 11/07/2020 03:40     EKG: I have personally reviewed.  Sinus rhythm, QTC 406, LAE, LAD, poor R wave progression.  Assessment/Plan Principal Problem:   NSTEMI (non-ST elevated myocardial infarction) ([place]) Active Problems:   Hyperlipidemia   Essential hypertension   Diabetes mellitus without complication (HCC)   GERD (gastroesophageal reflux disease)   Gout   Depression   CAD (coronary artery disease)   CKD (chronic kidney disease), stage IIIa   Iron deficiency anemia   Fall at home, initial encounter   Left wrist fracture   NSTEMI and hx of CAD: trop 61 -->286 -->430. CTA negative for PE. Dr. Saunders Revel is consulted.  - admit to progressive unit as inpatient - IV hearin - Trend Trop - Repeat EKG in the am  - prn Nitroglycerin, Morphine, and aspirin, lipitor  - Risk factor stratification: will check FLP and A1C  - 2d  echo  HLD: -Lipitor  Essential hypertension -IV hydralazine as needed -Amlodipine, Cozaar,  Diabetes mellitus without complication ([place]): Recent A1c 7.0, fairly controlled.  Patient is taking Metformin -Sliding scale insulin  GERD (gastroesophageal reflux disease) -Protonix  Gout -[place]  Depression -Celexa,  CKD (chronic kidney disease), stage  IIIa: Close to baseline.  Baseline creatinine 1.2-1.4.  Her creatinine is 1.25, BUN 26 -Follow-up by BMP  Iron deficiency anemia: Hemoglobin 11.2 -Continue iron supplement  Recent fall at home, initial encounter: -get CT-head due to need of IV heparin -->negative for acute intracranial abnormalities  Left wrist fracture -pain control -Wearing braces -Follow-up with orthopedic surgeon         DVT ppx: on IV  Heparin  Code Status: Full code Family Communication:  Yes, patient's daughter by phone  Disposition Plan:  Anticipate discharge back to previous environment Consults called: Dr. Saunders Revel of cardiology Admission status:  progressive unit  as inpt      Status is: Inpatient  Remains inpatient appropriate because:Inpatient level of care appropriate due to severity of illness.  Patient has multiple comorbidities, now presents with chest pain and non-STEMI.  Patient also has recent fall and left wrist fracture.  Her presentation is highly complicated.  Patient is at high risk of deteriorating given her old age.  Patient needs to be treated in hospital for at least 2 days.   Dispo: The patient is from: Home              Anticipated d/c is to: Home              Anticipated d/c date is: 2 days              Patient currently is not medically stable to d/c.          Date of Service 11/07/2020    Ivor Costa [facility]   If 7PM-7AM, please contact night-coverage [email or web address] 11/07/2020, 9:49 AM

## 2020-11-07 NOTE — Progress Notes (Signed)
ANTICOAGULATION CONSULT NOTE - Initial Consult  Pharmacy Consult for Heparin Indication: chest pain/ACS  Allergies  Allergen Reactions  . Keflex [Cephalexin] Swelling    SWELLING REACTION UNSPECIFIED     Patient Measurements: Height: 5\' 5"  (165.1 cm) Weight: 68 kg (150 lb) IBW/kg (Calculated) : 57 HEPARIN DW (KG): 68  Vital Signs: Temp: 97.7 F (36.5 C) (11/22 2353) Temp Source: Oral (11/22 2353) BP: 170/106 (11/23 0351) Pulse Rate: 66 (11/23 0351)  Labs: Recent Labs    11/06/20 2356 11/07/20 0238  HGB 11.2*  --   HCT 34.3*  --   PLT 240  --   CREATININE 1.25*  --   TROPONINIHS 61* 286*    Estimated Creatinine Clearance: 33.9 mL/min (A) (by C-G formula based on SCr of 1.25 mg/dL (H)).   Medical History: Past Medical History:  Diagnosis Date  . Anxiety   . Arthritis   . Blood clotting tendency ([place])    2006   BLOOD CLOT LEG TO GROIN/ post fall  . Cancer ([place]) 2005, 2010   MELANOMA   . Chronic kidney disease    Stage 3 Kidney Disease  . Depression   . Diabetes mellitus without complication ([place])   . DVT (deep venous thrombosis) ([place]) 2006   from fall  . 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[place]) 2006   Left leg  . GERD (gastroesophageal reflux disease)   . Gout   . Hyperlipidemia   . Hypertension   . Mental disorder   . Myocardial infarction ([place])    10+ YRS (Lexiscan: no evid of ischemia/infarct, EF 72% 06/23/12 [facility])    Medications:  (Not in a hospital admission)   Assessment: Heparin for ACS.  No anticoagulants PTA per med list.  Baseline labs ordered.   Goal of Therapy:  Heparin level 0.3-0.7 units/ml Monitor platelets by anticoagulation protocol: Yes   Plan:  Heparin bolus 4000 units x 1 then infusion at 950 units/hr Check HL ~ 8 hours after heparin initiated.  Hart Robinsons A 11/07/2020,5:09 AM

## 2020-11-07 NOTE — ED Provider Notes (Signed)
[facility] Emergency Department Provider Note   ____________________________________________   First MD Initiated Contact with Patient 11/07/20 0304     (approximate)  I have reviewed the triage vital signs and the nursing notes.   HISTORY  Chief Complaint Chest Pain    HPI Lindsey Shaw is a 79 y.o. female brought to the ED via EMS from home with a chief complaint of chest pain.  Patient reports sharp pain underneath her left breast starting approximately 10 PM.  History of MI but states this feels differently.  Associated with shortness of breath.  Patient does have a remote history of DVT status post Lovenox.  She did have a mechanical fall 1 week ago and injured her left chest wall.  Remote history of left humerus fracture and current left wrist fracture.  Denies fever, cough, abdominal pain, nausea, vomiting or diarrhea.  Patient is fully vaccinated against COVID-19.     Past Medical History:  Diagnosis Date  . Anxiety   . Arthritis   . Blood clotting tendency ([place])    2006   BLOOD CLOT LEG TO GROIN/ post fall  . Cancer ([place]) 2005, 2010   MELANOMA   . Chronic kidney disease    Stage 3 Kidney Disease  . Depression   . Diabetes mellitus without complication ([place])   . DVT (deep venous thrombosis) ([place]) 2006   from fall  . 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[place]) 2006   Left leg  . GERD (gastroesophageal reflux disease)   . Gout   . Hyperlipidemia   . Hypertension   . Mental disorder   . Myocardial infarction ([place])    10+ YRS (Lexiscan: no evid of ischemia/infarct, EF 72% 06/23/12 [facility])    Patient Active Problem List   Diagnosis Date Noted  . NSTEMI (non-ST elevated myocardial infarction) ([place]) 11/07/2020  . Right inguinal hernia 03/25/2018  . Primary localized osteoarthritis of right knee 04/09/2017  . Abnormal EKG 01/22/2016  . History of chest pain 01/22/2016  . Uncontrolled type 2 diabetes mellitus without complication, without  long-term current use of insulin 01/22/2016  . Hyperlipidemia 01/22/2016  . Essential hypertension 01/22/2016  . Intractable pain 10/21/2015    Past Surgical History:  Procedure Laterality Date  . APPENDECTOMY  1967  . BACK SURGERY     2007  . CATARACT EXTRACTION W/PHACO Left 11/05/2016   Procedure: CATARACT EXTRACTION PHACO AND INTRAOCULAR LENS PLACEMENT (IOC);  Surgeon: Birder Robson, MD;  Location: [facility] ORS;  Service: Ophthalmology;  Laterality: Left;  Lot # [id number] H [place]: 01:16.4 AP%:24.0 CDE: 18.29  . CATARACT EXTRACTION W/PHACO Right 11/26/2016   Procedure: CATARACT EXTRACTION PHACO AND INTRAOCULAR LENS PLACEMENT (IOC);  Surgeon: Birder Robson, MD;  Location: [facility] ORS;  Service: Ophthalmology;  Laterality: Right;  [place] 48.1 AP% 15.4 CDE 7.36 Fluid pack lot # [id number]  . CHOLECYSTECTOMY  1967  . EYE SURGERY    . INGUINAL HERNIA REPAIR Right 04/06/2018   Procedure: LAPAROSCOPIC INGUINAL HERNIA;  Surgeon: Robert Bellow, MD;  Location: [facility] ORS;  Service: General;  Laterality: Right;  . KYPHOPLASTY N/A 11/21/2015   Procedure: KYPHOPLASTY THORACIC 12;  Surgeon: Hessie Knows, MD;  Location: [facility] ORS;  Service: Orthopedics;  Laterality: N/A;  . LUMBAR LAMINECTOMY/DECOMPRESSION MICRODISCECTOMY Right 06/28/2013   Procedure: LUMBAR LAMINECTOMY/DECOMPRESSION MICRODISCECTOMY 1 LEVEL;  Surgeon: Elaina Hoops, MD;  Location: [place] [facility];  Service: Neurosurgery;  Laterality: Right;  Lumbar Laminectomy Decompression Lumbar Four-Five Right  . TOE SURGERY Bilateral  BIL GREAT TOE JOINT REMOVED  . TOTAL KNEE ARTHROPLASTY Right 04/09/2017  . TOTAL KNEE ARTHROPLASTY Right 04/09/2017   Procedure: RIGHT TOTAL KNEE ARTHROPLASTY;  Surgeon: Ninetta Lights, MD;  Location: [facility];  Service: Orthopedics;  Laterality: Right;  . TUBAL LIGATION     73    Prior to Admission medications   Medication Sig Start Date End Date Taking? Authorizing Provider  amLODipine (NORVASC) 5 MG tablet Take 5 mg by  mouth daily. 09/14/20  Yes [provider]  aspirin EC 81 MG tablet Take 81 mg by mouth every evening.   Yes [provider]  atorvastatin (LIPITOR) 40 MG tablet Take 40 mg by mouth every evening. 08/01/16  Yes [provider]  citalopram (CELEXA) 20 MG tablet Take 20 mg by mouth every evening.   Yes [provider]  diclofenac Sodium (VOLTAREN) 1 % GEL Apply 4 g topically 4 (four) times daily.   Yes [provider]  Febuxostat (ULORIC) 80 MG TABS Take 80 mg by mouth every evening.   Yes [provider]  ferrous sulfate 325 (65 FE) MG tablet Take 325 mg by mouth daily before supper.   Yes [provider]  gabapentin (NEURONTIN) 100 MG capsule Take 100 mg by mouth at bedtime.  09/12/20  Yes [provider]  losartan (COZAAR) 100 MG tablet Take 100 mg by mouth every evening.    Yes [provider]  metFORMIN (GLUCOPHAGE-XR) 500 MG 24 hr tablet Take 500 mg by mouth daily before supper.   Yes [provider]  omeprazole (PRILOSEC) 20 MG capsule Take 20 mg by mouth every evening.   Yes [provider]  acetaminophen (TYLENOL) 500 MG tablet Take 500-1,000 mg by mouth every 6 (six) hours as needed (for pain.). Patient not taking: Reported on 11/07/2020    [provider]  cyanocobalamin 1000 MCG tablet Take 1,000 mcg by mouth daily. Patient not taking: Reported on 11/07/2020    [provider]    Allergies Keflex [cephalexin]  Family History  Problem Relation Age of Onset  . Ovarian cancer Mother   . Pancreatic cancer Mother   . Lung cancer Father   . AAA (abdominal aortic aneurysm) Paternal Aunt   . Breast cancer Maternal Aunt        60's  . Breast cancer Maternal Uncle        60's  . Breast cancer Cousin        1 cousin-maternal side    Social History Social History   Tobacco Use  . Smoking status: Never Smoker  . Smokeless tobacco: Never Used  Vaping Use  . Vaping  Use: Never used  Substance Use Topics  . Alcohol use: No  . Drug use: No    Review of Systems  Constitutional: No fever/chills Eyes: No visual changes. ENT: No sore throat. Cardiovascular: Positive for chest pain. Respiratory: Positive for shortness of breath. Gastrointestinal: No abdominal pain.  No nausea, no vomiting.  No diarrhea.  No constipation. Genitourinary: Negative for dysuria. Musculoskeletal: Negative for back pain. Skin: Negative for rash. Neurological: Negative for headaches, focal weakness or numbness.   ____________________________________________   PHYSICAL EXAM:  VITAL SIGNS: ED Triage Vitals [11/06/20 2353]  Enc Vitals Group     BP (!) 174/106     Pulse Rate (!) 102     Resp 20     Temp 97.7 F (36.5 C)     Temp Source Oral     SpO2  94 %     Weight 150 lb (68 kg)     Height 5\' 5"  (1.651 m)     Head Circumference      Peak Flow      Pain Score 5     Pain Loc      Pain Edu?      Excl. in [place]?     Constitutional: Alert and oriented.  Elderly appearing and in no acute distress. Eyes: Conjunctivae are normal. PERRL. EOMI. Head: Atraumatic. Nose: No congestion/rhinnorhea. Mouth/Throat: Mucous membranes are moist.   Neck: No stridor.   Cardiovascular: Normal rate, regular rhythm. Grossly normal heart sounds.  Good peripheral circulation. Respiratory: Normal respiratory effort.  No retractions. Lungs CTAB.  No crepitus or splinting.  Ecchymosis to left lateral breast. Gastrointestinal: Soft and nontender to light or deep palpation.. No distention. No abdominal bruits. No CVA tenderness. Musculoskeletal: No lower extremity tenderness nor edema.  No joint effusions. Neurologic:  Normal speech and language. No gross focal neurologic deficits are appreciated.  Skin:  Skin is warm, dry and intact. No rash noted. Psychiatric: Mood and affect are normal. Speech and behavior are normal.  ____________________________________________   LABS (all labs  ordered are listed, but only abnormal results are displayed)  Labs Reviewed  CBC - Abnormal; Notable for the following components:      Result Value   RBC 3.61 (*)    Hemoglobin 11.2 (*)    HCT 34.3 (*)    All other components within normal limits  COMPREHENSIVE METABOLIC PANEL - Abnormal; Notable for the following components:   Glucose, Bld 164 (*)    BUN 26 (*)    Creatinine, Ser 1.25 (*)    GFR, Estimated 44 (*)    All other components within normal limits  TROPONIN I (HIGH SENSITIVITY) - Abnormal; Notable for the following components:   Troponin I (High Sensitivity) 61 (*)    All other components within normal limits  TROPONIN I (HIGH SENSITIVITY) - Abnormal; Notable for the following components:   Troponin I (High Sensitivity) 286 (*)    All other components within normal limits  RESP PANEL BY RT-PCR (FLU A&B, COVID) ARPGX2  MAGNESIUM  APTT  PROTIME-INR  HEPARIN LEVEL (UNFRACTIONATED)  CBC   ____________________________________________  EKG  ED ECG REPORT I, Blenda Wisecup J, the attending physician, personally viewed and interpreted this ECG.   Date: 11/07/2020  EKG Time: 2351  Rate: 90  Rhythm: normal EKG, normal sinus rhythm  Axis: LAD  Intervals:none  ST&T Change: Nonspecific  ____________________________________________  RADIOLOGY I, Gauri Galvao J, personally viewed and evaluated these images (plain radiographs) as part of my medical decision making, as well as reviewing the written report by the radiologist.  ED MD interpretation: No pneumonia, possible nondisplaced fracture left humeral neck  Official radiology report(s): DG Chest 2 View  Result Date: 11/07/2020 CLINICAL DATA:  Chest pain starting tonight under the left breast. [place] a week ago with injury to the left ribs. EXAM: CHEST - 2 VIEW COMPARISON:  06/21/2013 FINDINGS: Heart size and pulmonary vascularity are normal. Slight linear atelectasis in the left base. No airspace disease or consolidation.  Suggestion of bronchiectasis and bronchial wall thickening in the upper lungs possibly indicating bronchitis. No pleural effusions. No pneumothorax. Mediastinal contours appear intact. Visualized ribs are nondepressed. There is incomplete visualization of the left humerus and degenerative changes are present in the left shoulder but there is possible linear nondisplaced fracture of the left humeral neck. Degenerative changes in the spine.  Previous vertebral kyphoplasty. IMPRESSION: 1. Probable bronchiectasis and bronchial wall thickening in the upper lungs. No focal consolidation. 2. Possible nondisplaced fracture of the left humeral neck. Electronically Signed   By: Lucienne Capers M.D.   On: 11/07/2020 00:25   CT Angio Chest PE W/Cm &/Or Wo Cm  Result Date: 11/07/2020 CLINICAL DATA:  Pulmonary embolus suspected with high probability. History of deep venous thrombosis. Recent fall with chest bruising. EXAM: CT ANGIOGRAPHY CHEST WITH CONTRAST TECHNIQUE: Multidetector CT imaging of the chest was performed using the standard protocol during bolus administration of intravenous contrast. Multiplanar CT image reconstructions and MIPs were obtained to evaluate the vascular anatomy. CONTRAST:  72mL OMNIPAQUE IOHEXOL 350 MG/ML SOLN COMPARISON:  Chest radiograph 11/06/2020 FINDINGS: Cardiovascular: Motion artifact limits examination. There is good opacification of the central and segmental pulmonary arteries. No focal filling defects. No evidence of significant pulmonary embolus. Normal heart size. No pericardial effusions. Normal caliber thoracic aorta. No aortic dissection. Great vessel origins are patent. Mediastinum/Nodes: Esophagus is decompressed. No significant lymphadenopathy. Lungs/Pleura: The lungs are clear. No pleural effusions. No pneumothorax. Airways are patent. Upper Abdomen: No acute abnormalities demonstrated in the visualized upper abdomen. Musculoskeletal: Degenerative changes in the thoracic spine.  Lower thoracic vertebral kyphoplasty procedure. No destructive bone lesions. Review of the MIP images confirms the above findings. IMPRESSION: 1. No evidence of significant pulmonary embolus. 2. The lungs are clear. Electronically Signed   By: Lucienne Capers M.D.   On: 11/07/2020 04:36   DG Humerus Left  Result Date: 11/07/2020 CLINICAL DATA:  Fall EXAM: LEFT HUMERUS - 2+ VIEW COMPARISON:  None. FINDINGS: Chronic fracture of the proximal left humerus with callus formation dorsally. No dislocation or acute fracture. IMPRESSION: No acute fracture or dislocation of the left humerus. Electronically Signed   By: Ulyses Jarred M.D.   On: 11/07/2020 03:40    ____________________________________________   PROCEDURES  Procedure(s) performed (including Critical Care):  .1-3 Lead EKG Interpretation Performed by: Paulette Blanch, MD Authorized by: Paulette Blanch, MD     Interpretation: normal     ECG rate:  90   ECG rate assessment: normal     Rhythm: sinus rhythm     Ectopy: none     Conduction: normal   Comments:     Patient placed on cardiac monitor to evaluate for arrhythmias    CRITICAL CARE Performed by: Paulette Blanch   Total critical care time: 30 minutes  Critical care time was exclusive of separately billable procedures and treating other patients.  Critical care was necessary to treat or prevent imminent or life-threatening deterioration.  Critical care was time spent personally by me on the following activities: development of treatment plan with patient and/or surrogate as well as nursing, discussions with consultants, evaluation of patient's response to treatment, examination of patient, obtaining history from patient or surrogate, ordering and performing treatments and interventions, ordering and review of laboratory studies, ordering and review of radiographic studies, pulse oximetry and re-evaluation of patient's  condition.  ____________________________________________   INITIAL IMPRESSION / ASSESSMENT AND PLAN / ED COURSE  As part of my medical decision making, I reviewed the following data within the [place] History obtained from family, Nursing notes reviewed and incorporated, Labs reviewed, EKG interpreted, Old chart reviewed (outpatient rehabilitation for left shoulder fracture), Radiograph reviewed, Discussed with admitting physician and Notes from prior ED visits (10/07/2020 urgent care visit for lower extremity edema)     79 year old female with a history of MI  and recent fall onto left side presenting with chest pain and shortness of breath. Differential diagnosis includes, but is not limited to, ACS, aortic dissection, pulmonary embolism, cardiac tamponade, pneumothorax, pneumonia, pericarditis, myocarditis, GI-related causes including esophagitis/gastritis, and musculoskeletal chest wall pain.    Laboratory results demonstrate stable creatinine, mildly elevated troponin.  Patient declines analgesia at this time.  Awaiting repeat troponin.  Will obtain CTA chest given pleuritic nature of patient's pain.   Clinical Course as of Nov 08 631  Tue Nov 07, 2020  6948 Repeat troponin noted to be elevated.  Patient to have CTA chest.  Barring any hemorrhage, will start heparin bolus with drip.  Anticipate hospitalization.   [JS]  [id number] Updated patient on negative CTA results.  Patient took 4 baby aspirin prior to arrival.  Accepts offer for low-dose morphine.  Will initiate heparin bolus with drip and discussed case with hospitalist services for admission.   [JS]    Clinical Course User Index [JS] Paulette Blanch, MD     ____________________________________________   FINAL CLINICAL IMPRESSION(S) / ED DIAGNOSES  Final diagnoses:  NSTEMI (non-ST elevated myocardial infarction) ([place])  Chest pain due to myocardial ischemia, unspecified ischemic chest pain type     ED  Discharge Orders    None      *Please note:  Shanyah Gattuso was evaluated in Emergency Department on 11/07/2020 for the symptoms described in the history of present illness. She was evaluated in the context of the global COVID-19 pandemic, which necessitated consideration that the patient might be at risk for infection with the SARS-CoV-2 virus that causes COVID-19. Institutional protocols and algorithms that pertain to the evaluation of patients at risk for COVID-19 are in a state of rapid change based on information released by regulatory bodies including the CDC and federal and state organizations. These policies and algorithms were followed during the patient's care in the ED.  Some ED evaluations and interventions may be delayed as a result of limited staffing during and the pandemic.*   Note:  This document was prepared using Dragon voice recognition software and may include unintentional dictation errors.   Paulette Blanch, MD 11/07/20 [phone]

## 2020-11-07 NOTE — Consult Note (Addendum)
Cardiology Consultation:   Patient ID: Lindsey Shaw; [id number]; 27-Sep-1941   Admit date: 11/07/2020 Date of Consult: 11/07/2020  Primary Care Provider: Kirk Ruths, MD Primary Cardiologist: Rockey Situ (last seen as an outpatient 02/2017) Primary Electrophysiologist:  None   Patient Profile:   Lindsey Shaw is a 79 y.o. female with a hx of CKD stage III, DM2, HTN, HLD, left lower extremity DVT in the setting of a fall, and GERD who is being seen today for the evaluation of elevated troponin at the request of Dr. Blaine Hamper.  History of Present Illness:   Ms. [name] was previously evaluated by Dr. Clayborn Bigness for preoperative evaluation for back surgery.  She was noted to have an abnormal EKG at that time.  Recommendation was to proceed with surgery and undergo stress testing thereafter.  Subsequent stress test in 2013 showed no evidence of ischemia.  Following this, she was evaluated by Dr. Rockey Situ in 02/2017 for preoperative evaluation for a right knee replacement.  She has been lost to follow-up since.  More recently, she was seen by orthopedic surgery in 06/2020 following a mechanical fall going up the stairs in which she fell backwards landing on her left shoulder leading to a nondisplaced left humeral fracture which has been followed by orthopedics.  She was seen by an urgent care in late 09/2020 with bilateral lower extremity swelling with ultrasound being negative for DVT with symptoms felt to be most likely related to venous stasis.  Earlier this month she suffered a mechanical fall tripping on her dog landing on her left side injuring the left side of her rib cage and causing a left wrist fracture.  She presented to [facility] ED late on 11/22 after developing sharp left-sided chest pain that was underneath her left breast and associated with SOB.  She indicates she has been with sharp left-sided chest discomfort stemming from her fall that occurred last week.  On the evening of  11/22, she was woken up around 10 PM with sudden onset of sharp left-sided chest pain underneath her left breast with associated shortness of breath.  This pain felt different than the discomfort she had experienced previously following her fall.  Leading up to the development of this pain she denied any exertional symptoms or change in her functional status.  Due to persisting symptoms she was brought to [facility] ED.  Upon the patient's arrival to [facility] they were found to have elevated BP in the 854O to 270J systolic, temp afebrile, oxygen saturation 94-100% on room air. EKG showed NSR, 90 bpm, left axis deviation, LVH, poor R wave progression along the precordial leads, baseline wanderingg, nonspecific st/t changes. CXR showed probable bronchiectasis without focal consolidation as well as a possible nondisplaced fracture of the left humeral neck. CTA chest showed no evidence of PE. CT head showed no acute intracranial abnormality. Dedicated humerus xray showed no evidence of fracture. Labs showed HS-Tn 61 with a delta troponin of 286 trending to 430, Covid negative, BUN 26, SCr 1.25, potassium 4.0, magnesium 1.8, WBC 7.1, HGB 11.2, PLT 240. They were started on a heparin gtt. Currently, she continued to note sharp 10/10 chest pain with positional and rotational movement, otherwise she is without pain.    Past Medical History:  Diagnosis Date  . Anxiety   . Arthritis   . Cancer ([place]) 2005, 2010   MELANOMA   . Chronic kidney disease    Stage 3 Kidney Disease  . Depression   . Diabetes  mellitus without complication ([place])   . DVT (deep venous thrombosis) ([place]) 2006   from fall  . GERD (gastroesophageal reflux disease)   . Gout   . History of stress test    10+ YRS (Lexiscan: no evid of ischemia/infarct, EF 72% 06/23/12 [facility])  . Hyperlipidemia   . Hypertension   . Mental disorder     Past Surgical History:  Procedure Laterality Date  . APPENDECTOMY  1967  . BACK SURGERY     2007  . CATARACT  EXTRACTION W/PHACO Left 11/05/2016   Procedure: CATARACT EXTRACTION PHACO AND INTRAOCULAR LENS PLACEMENT (IOC);  Surgeon: Birder Robson, MD;  Location: [facility] ORS;  Service: Ophthalmology;  Laterality: Left;  Lot # [id number] H [place]: 01:16.4 AP%:24.0 CDE: 18.29  . CATARACT EXTRACTION W/PHACO Right 11/26/2016   Procedure: CATARACT EXTRACTION PHACO AND INTRAOCULAR LENS PLACEMENT (IOC);  Surgeon: Birder Robson, MD;  Location: [facility] ORS;  Service: Ophthalmology;  Laterality: Right;  [place] 48.1 AP% 15.4 CDE 7.36 Fluid pack lot # [id number]  . CHOLECYSTECTOMY  1967  . EYE SURGERY    . INGUINAL HERNIA REPAIR Right 04/06/2018   Procedure: LAPAROSCOPIC INGUINAL HERNIA;  Surgeon: Robert Bellow, MD;  Location: [facility] ORS;  Service: General;  Laterality: Right;  . KYPHOPLASTY N/A 11/21/2015   Procedure: KYPHOPLASTY THORACIC 12;  Surgeon: Hessie Knows, MD;  Location: [facility] ORS;  Service: Orthopedics;  Laterality: N/A;  . LUMBAR LAMINECTOMY/DECOMPRESSION MICRODISCECTOMY Right 06/28/2013   Procedure: LUMBAR LAMINECTOMY/DECOMPRESSION MICRODISCECTOMY 1 LEVEL;  Surgeon: Elaina Hoops, MD;  Location: [place] [facility]UR[facility] ORS;  Service: Neurosurgery;  Laterality: Right;  Lumbar Laminectomy Decompression Lumbar Four-Five Right  . TOE SURGERY Bilateral    BIL GREAT TOE JOINT REMOVED  . TOTAL KNEE ARTHROPLASTY Right 04/09/2017  . TOTAL KNEE ARTHROPLASTY Right 04/09/2017   Procedure: RIGHT TOTAL KNEE ARTHROPLASTY;  Surgeon: Ninetta Lights, MD;  Location: [place];  Service: Orthopedics;  Laterality: Right;  . TUBAL LIGATION     73     Home Meds: Prior to Admission medications   Medication Sig Start Date End Date Taking? Authorizing Provider  amLODipine (NORVASC) 5 MG tablet Take 5 mg by mouth daily. 09/14/20  Yes [provider]  aspirin EC 81 MG tablet Take 81 mg by mouth every evening.   Yes [provider]  atorvastatin (LIPITOR) 40 MG tablet Take 40 mg by mouth every evening. 08/01/16  Yes [provider]   citalopram (CELEXA) 20 MG tablet Take 20 mg by mouth every evening.   Yes [provider]  diclofenac Sodium (VOLTAREN) 1 % GEL Apply 4 g topically 4 (four) times daily.   Yes [provider]  Febuxostat (ULORIC) 80 MG TABS Take 80 mg by mouth every evening.   Yes [provider]  ferrous sulfate 325 (65 FE) MG tablet Take 325 mg by mouth daily before supper.   Yes [provider]  gabapentin (NEURONTIN) 100 MG capsule Take 100 mg by mouth at bedtime.  09/12/20  Yes [provider]  losartan (COZAAR) 100 MG tablet Take 100 mg by mouth every evening.    Yes [provider]  metFORMIN (GLUCOPHAGE-XR) 500 MG 24 hr tablet Take 500 mg by mouth daily before supper.   Yes [provider]  omeprazole (PRILOSEC) 20 MG capsule Take 20 mg by mouth every evening.   Yes [provider]  acetaminophen (TYLENOL) 500 MG tablet Take 500-1,000 mg by mouth every 6 (six) hours as needed (for pain.). Patient not taking: Reported  on 11/07/2020    [provider]  cyanocobalamin 1000 MCG tablet Take 1,000 mcg by mouth daily. Patient not taking: Reported on 11/07/2020    [provider]    Inpatient Medications: Scheduled Meds: . amLODipine  5 mg Oral Daily  . aspirin EC  81 mg Oral QPM  . atorvastatin  40 mg Oral QPM  . citalopram  20 mg Oral QPM  . diclofenac Sodium  4 g Topical QID  . febuxostat  80 mg Oral QPM  . ferrous sulfate  325 mg Oral QAC supper  . gabapentin  100 mg Oral QHS  . insulin aspart  0-5 Units Subcutaneous QHS  . insulin aspart  0-9 Units Subcutaneous TID WC  . losartan  100 mg Oral QPM  . pantoprazole  40 mg Oral Daily   Continuous Infusions: . sodium chloride 50 mL/hr at 11/07/20 1107  . heparin 950 Units/hr (11/07/20 0537)   PRN Meds: acetaminophen, albuterol, dextromethorphan-guaiFENesin, hydrALAZINE, morphine injection, nitroGLYCERIN, ondansetron (ZOFRAN) IV  Allergies:   Allergies    Allergen Reactions  . Keflex [Cephalexin] Swelling    SWELLING REACTION UNSPECIFIED     Social History:   Social History   Socioeconomic History  . Marital status: Widowed    Spouse name: Not on file  . Number of children: Not on file  . Years of education: Not on file  . Highest education level: Not on file  Occupational History  . Not on file  Tobacco Use  . Smoking status: Never Smoker  . Smokeless tobacco: Never Used  Vaping Use  . Vaping Use: Never used  Substance and Sexual Activity  . Alcohol use: No  . Drug use: No  . Sexual activity: Not on file  Other Topics Concern  . Not on file  Social History Narrative  . Not on file   Social Determinants of Health   Financial Resource Strain:   . Difficulty of Paying Living Expenses: Not on file  Food Insecurity:   . Worried About Charity fundraiser in the Last Year: Not on file  . Ran Out of Food in the Last Year: Not on file  Transportation Needs:   . Lack of Transportation (Medical): Not on file  . Lack of Transportation (Non-Medical): Not on file  Physical Activity:   . Days of Exercise per Week: Not on file  . Minutes of Exercise per Session: Not on file  Stress:   . Feeling of Stress : Not on file  Social Connections:   . Frequency of Communication with Friends and Family: Not on file  . Frequency of Social Gatherings with Friends and Family: Not on file  . Attends Religious Services: Not on file  . Active Member of Clubs or Organizations: Not on file  . Attends Archivist Meetings: Not on file  . Marital Status: Not on file  Intimate Partner Violence:   . Fear of Current or Ex-Partner: Not on file  . Emotionally Abused: Not on file  . Physically Abused: Not on file  . Sexually Abused: Not on file     Family History:   Family History  Problem Relation Age of Onset  . Ovarian cancer Mother   . Pancreatic cancer Mother   . Lung cancer Father   . AAA (abdominal aortic aneurysm) Paternal  Aunt   . Breast cancer Maternal Aunt        60's  . Breast cancer Maternal Uncle  60's  . Breast cancer Cousin        1 cousin-maternal side    ROS:  Review of Systems  Constitutional: Positive for malaise/fatigue. Negative for chills, diaphoresis, fever and weight loss.  HENT: Negative for congestion.   Eyes: Negative for discharge and redness.  Respiratory: Positive for shortness of breath. Negative for cough, hemoptysis, sputum production and wheezing.   Cardiovascular: Positive for chest pain. Negative for palpitations, orthopnea, claudication, leg swelling and PND.  Gastrointestinal: Negative for abdominal pain, blood in stool, heartburn, melena, nausea and vomiting.  Genitourinary: Negative for hematuria.  Musculoskeletal: Positive for falls and joint pain. Negative for myalgias.  Skin: Negative for rash.  Neurological: Positive for weakness. Negative for dizziness, tingling, tremors, sensory change, speech change, focal weakness and loss of consciousness.  Endo/Heme/Allergies: Does not bruise/bleed easily.  Psychiatric/Behavioral: Negative for substance abuse. The patient is not nervous/anxious.   All other systems reviewed and are negative.     Physical Exam/Data:   Vitals:   11/07/20 0700 11/07/20 0730 11/07/20 0800 11/07/20 0830  BP: (!) 163/83 (!) 173/80 (!) 189/111 (!) 165/79  Pulse: (!) 56 62 83 63  Resp: 13 12 (!) 24 14  Temp:      TempSrc:      SpO2: 97% 97% 99% 98%  Weight:      Height:       No intake or output data in the 24 hours ending 11/07/20 1331 Filed Weights   11/06/20 2353  Weight: 68 kg   Body mass index is 24.96 kg/m.   Physical Exam: General: Well developed, well nourished, in no acute distress. Head: Normocephalic, atraumatic, sclera non-icteric, no xanthomas, nares without discharge.  Neck: Negative for carotid bruits. JVD not elevated. Lungs: Clear bilaterally to auscultation without wheezes, rales, or rhonchi. Breathing is  unlabored. Heart: RRR with S1 S2. No murmurs, rubs, or gallops appreciated. Chest pain is reproducible with rotational and positional movement as well as to palpation on exam. Abdomen: Soft, non-tender, non-distended with normoactive bowel sounds. No hepatomegaly. No rebound/guarding. No obvious abdominal masses. Msk:  Strength and tone appear normal for age. Extremities: No clubbing or cyanosis. No edema. Distal pedal pulses are 2+ and equal bilaterally. Brace noted on the left wrist.  Neuro: Alert and oriented X 3. No facial asymmetry. No focal deficit. Moves all extremities spontaneously. Psych:  Responds to questions appropriately with a normal affect.   EKG:  The EKG was personally reviewed and demonstrates: NSR, 90 bpm, left axis deviation, LVH, poor R wave progression along the precordial leads, baseline wanderingg, nonspecific st/t changes Telemetry:  Telemetry was personally reviewed and demonstrates: SR with artifact   Weights: Filed Weights   11/06/20 2353  Weight: 68 kg    Relevant CV Studies:  2D echo 11/07/2020: 1. Left ventricular ejection fraction, by estimation, is 55 to 60%. The  left ventricle has normal function. Left ventricular endocardial border  not optimally defined to evaluate regional wall motion. Left ventricular  diastolic parameters are consistent  with Grade I diastolic dysfunction (impaired relaxation).  2. Right ventricular systolic function is normal. The right ventricular  size is normal. Tricuspid regurgitation signal is inadequate for assessing  PA pressure.  3. The mitral valve was not well visualized. Trivial mitral valve  regurgitation. No evidence of mitral stenosis.  4. The aortic valve was not well visualized. Aortic valve regurgitation  is not visualized. No aortic stenosis is present.  Laboratory Data:  Chemistry Recent Labs  Lab 11/06/20 2356  NA 136  K 4.0  CL 103  CO2 23  GLUCOSE 164*  BUN 26*  CREATININE 1.25*  CALCIUM  9.4  GFRNONAA 44*  ANIONGAP 10    Recent Labs  Lab 11/06/20 2356  PROT 7.7  ALBUMIN 4.1  AST 18  ALT 11  ALKPHOS 94  BILITOT 0.7   Hematology Recent Labs  Lab 11/06/20 2356  WBC 7.1  RBC 3.61*  HGB 11.2*  HCT 34.3*  MCV 95.0  MCH 31.0  MCHC 32.7  RDW 12.7  PLT 240   Cardiac EnzymesNo results for input(s): TROPONINI in the last 168 hours. No results for input(s): TROPIPOC in the last 168 hours.  BNPNo results for input(s): BNP, PROBNP in the last 168 hours.  DDimer No results for input(s): DDIMER in the last 168 hours.  Radiology/Studies:  DG Chest 2 View  Result Date: 11/07/2020 IMPRESSION: 1. Probable bronchiectasis and bronchial wall thickening in the upper lungs. No focal consolidation. 2. Possible nondisplaced fracture of the left humeral neck. Electronically Signed   By: Lucienne Capers M.D.   On: 11/07/2020 00:25   CT HEAD WO CONTRAST  Result Date: 11/07/2020 IMPRESSION: Chronic atrophic and ischemic changes stable from the prior exam. No acute abnormality noted. Electronically Signed   By: Inez Catalina M.D.   On: 11/07/2020 09:42   CT Angio Chest PE W/Cm &/Or Wo Cm  Result Date: 11/07/2020 IMPRESSION: 1. No evidence of significant pulmonary embolus. 2. The lungs are clear. Electronically Signed   By: Lucienne Capers M.D.   On: 11/07/2020 04:36   DG Humerus Left  Result Date: 11/07/2020 IMPRESSION: No acute fracture or dislocation of the left humerus. Electronically Signed   By: Ulyses Jarred M.D.   On: 11/07/2020 03:40    Assessment and Plan:   1.  NSTEMI with stuttering chest pain: -Overall, her chest pain features are quite atypical in that they are present after sustaining a mechanical fall to the outside, nonexertional, worse with positional movement/rotation, and reproducible to palpation on exam -Currently, she continues to note 10 out of 10 chest pain with movement -She does have some underlying CAD risk factors including coronary artery  calcification on noninvasive imaging, DM, HTN, and HLD -EKG without acute ischemic changes and echo with preserved LVSF and no obvious WMA -HS-Tn currently 430, continue to cycle until peak -As long as her troponin remains relatively flat trending we will plan for Lexiscan MPI on 11/24 -If there is dynamic elevation in her high-sensitivity troponin stress testing will need to be canceled and she will need to undergo diagnostic LHC at that time -ASA -Heparin gtt can be continued for now -Echo as above -Lipid and A1c for further risk stratification -N.p.o. at midnight  2. CKD stage III: -Renal function appears to be at her approximate baseline -Monitor   3. HTN: -Blood pressure mildly elevated -She remains on amlodipine and losartan -If needed, amlodipine can be titrated to 10 mg  4. HLD: -Lipids pending -PTA Lipitor  5. Falls: -Multiple falls recently  -Consider PT/OT evaluation -Management per IM    For questions or updates, please contact [place] Please consult [email or web address] for contact info under Cardiology/STEMI.   Signed, Christell Faith, PA-C [facility] Pager: [phone] 11/07/2020, 1:31 PM

## 2020-11-08 ENCOUNTER — Encounter: Payer: Self-pay | Admitting: Internal Medicine

## 2020-11-08 ENCOUNTER — Other Ambulatory Visit: Payer: Medicare Other

## 2020-11-08 ENCOUNTER — Encounter
Admission: EM | Disposition: A | Discharge status: Home / Self Care | Payer: Self-pay | Source: Home / Self Care | Attending: Family Medicine

## 2020-11-08 ENCOUNTER — Telehealth: Payer: Self-pay | Admitting: Internal Medicine

## 2020-11-08 DIAGNOSIS — I2510 Atherosclerotic heart disease of native coronary artery without angina pectoris: Secondary | ICD-10-CM | POA: Diagnosis not present

## 2020-11-08 DIAGNOSIS — I214 Non-ST elevation (NSTEMI) myocardial infarction: Secondary | ICD-10-CM | POA: Diagnosis not present

## 2020-11-08 DIAGNOSIS — R001 Bradycardia, unspecified: Secondary | ICD-10-CM | POA: Diagnosis not present

## 2020-11-08 HISTORY — DX: Atherosclerotic heart disease of native coronary artery without angina pectoris: I25.10

## 2020-11-08 HISTORY — PX: LEFT HEART CATH AND CORONARY ANGIOGRAPHY: CATH118249

## 2020-11-08 LAB — HEPARIN LEVEL (UNFRACTIONATED)
Heparin Unfractionated: 0.45 IU/mL (ref 0.30–0.70)
Heparin Unfractionated: 0.42 IU/mL (ref 0.30–0.70)

## 2020-11-08 LAB — LIPID PANEL
Cholesterol: 143 mg/dL (ref 0–200)
HDL: 63 mg/dL (ref >40)
LDL Cholesterol: 69 mg/dL (ref 0–99)
Total CHOL/HDL Ratio: 2.3 RATIO
Triglycerides: 54 mg/dL (ref <150)
VLDL: 11 mg/dL (ref 0–40)

## 2020-11-08 LAB — CBC
HCT: 31.1 % — ABNORMAL LOW (ref 36.0–46.0)
Hemoglobin: 10.2 g/dL — ABNORMAL LOW (ref 12.0–15.0)
MCH: 30.9 pg (ref 26.0–34.0)
MCHC: 32.8 g/dL (ref 30.0–36.0)
MCV: 94.2 fL (ref 80.0–100.0)
Platelets: 217 10*3/uL (ref 150–400)
RBC: 3.3 MIL/uL — ABNORMAL LOW (ref 3.87–5.11)
RDW: 12.9 % (ref 11.5–15.5)
WBC: 5 10*3/uL (ref 4.0–10.5)
nRBC: 0 % (ref 0.0–0.2)

## 2020-11-08 LAB — BASIC METABOLIC PANEL
Anion gap: 7 (ref 5–15)
BUN: 25 mg/dL — ABNORMAL HIGH (ref 8–23)
CO2: 25 mmol/L (ref 22–32)
Calcium: 8.9 mg/dL (ref 8.9–10.3)
Chloride: 105 mmol/L (ref 98–111)
Creatinine, Ser: 1.12 mg/dL — ABNORMAL HIGH (ref 0.44–1.00)
GFR, Estimated: 51 mL/min — ABNORMAL LOW (ref >60)
Glucose, Bld: 137 mg/dL — ABNORMAL HIGH (ref 70–99)
Potassium: 4.7 mmol/L (ref 3.5–5.1)
Sodium: 137 mmol/L (ref 135–145)

## 2020-11-08 LAB — GLUCOSE, CAPILLARY
Glucose-Capillary: 129 mg/dL — ABNORMAL HIGH (ref 70–99)
Glucose-Capillary: 120 mg/dL — ABNORMAL HIGH (ref 70–99)
Glucose-Capillary: 115 mg/dL — ABNORMAL HIGH (ref 70–99)
Glucose-Capillary: 152 mg/dL — ABNORMAL HIGH (ref 70–99)

## 2020-11-08 LAB — HEMOGLOBIN A1C
Hgb A1c MFr Bld: 7 % — ABNORMAL HIGH (ref 4.8–5.6)
Mean Plasma Glucose: 154.2 mg/dL

## 2020-11-08 SURGERY — LEFT HEART CATH AND CORONARY ANGIOGRAPHY
Anesthesia: Moderate Sedation

## 2020-11-08 MED ORDER — HEPARIN (PORCINE) IN NACL 1000-0.9 UT/500ML-% IV SOLN
INTRAVENOUS | Status: DC | PRN
  Administered 2020-11-08: 500 mL

## 2020-11-08 MED ORDER — FENTANYL CITRATE (PF) 100 MCG/2ML IJ SOLN
INTRAMUSCULAR | Status: DC | PRN
  Administered 2020-11-08: 25 ug via INTRAVENOUS

## 2020-11-08 MED ORDER — FENTANYL CITRATE (PF) 100 MCG/2ML IJ SOLN
INTRAMUSCULAR | Status: AC
  Filled 2020-11-08: qty 2

## 2020-11-08 MED ORDER — SODIUM CHLORIDE 0.9% FLUSH
3.0000 mL | Freq: Two times a day (BID) | INTRAVENOUS | Status: DC
  Administered 2020-11-08 – 2020-11-09 (×3): 3 mL via INTRAVENOUS

## 2020-11-08 MED ORDER — VERAPAMIL HCL 2.5 MG/ML IV SOLN
INTRAVENOUS | Status: DC | PRN
  Administered 2020-11-08: 2.5 mg via INTRAVENOUS

## 2020-11-08 MED ORDER — LIDOCAINE HCL (PF) 1 % IJ SOLN
INTRAMUSCULAR | Status: AC
  Filled 2020-11-08: qty 30

## 2020-11-08 MED ORDER — HEPARIN SODIUM (PORCINE) 5000 UNIT/ML IJ SOLN
5000.0000 [IU] | Freq: Three times a day (TID) | INTRAMUSCULAR | Status: DC
  Administered 2020-11-08 – 2020-11-09 (×2): 5000 [IU] via SUBCUTANEOUS
  Filled 2020-11-08 (×2): qty 1

## 2020-11-08 MED ORDER — MIDAZOLAM HCL 2 MG/2ML IJ SOLN
INTRAMUSCULAR | Status: AC
  Filled 2020-11-08: qty 2

## 2020-11-08 MED ORDER — VERAPAMIL HCL 2.5 MG/ML IV SOLN
INTRAVENOUS | Status: AC
  Filled 2020-11-08: qty 2

## 2020-11-08 MED ORDER — HEPARIN SODIUM (PORCINE) 1000 UNIT/ML IJ SOLN
INTRAMUSCULAR | Status: DC | PRN
  Administered 2020-11-08: 3500 [IU] via INTRAVENOUS

## 2020-11-08 MED ORDER — SODIUM CHLORIDE 0.9 % IV SOLN: INTRAVENOUS | Status: AC

## 2020-11-08 MED ORDER — IOHEXOL 300 MG/ML  SOLN
INTRAMUSCULAR | Status: DC | PRN
  Administered 2020-11-08: 65 mL

## 2020-11-08 MED ORDER — HEPARIN (PORCINE) IN NACL 1000-0.9 UT/500ML-% IV SOLN
INTRAVENOUS | Status: AC
  Filled 2020-11-08: qty 1000

## 2020-11-08 MED ORDER — LABETALOL HCL 5 MG/ML IV SOLN: 10.0000 mg | INTRAVENOUS | Status: AC | PRN

## 2020-11-08 MED ORDER — HYDRALAZINE HCL 20 MG/ML IJ SOLN: 10.0000 mg | INTRAMUSCULAR | Status: AC | PRN

## 2020-11-08 MED ORDER — MIDAZOLAM HCL 2 MG/2ML IJ SOLN
INTRAMUSCULAR | Status: DC | PRN
  Administered 2020-11-08: 1 mg via INTRAVENOUS

## 2020-11-08 MED ORDER — HEPARIN SODIUM (PORCINE) 1000 UNIT/ML IJ SOLN
INTRAMUSCULAR | Status: AC
  Filled 2020-11-08: qty 1

## 2020-11-08 MED ORDER — SODIUM CHLORIDE 0.9 % IV SOLN: 250.0000 mL | INTRAVENOUS | Status: DC | PRN

## 2020-11-08 MED ORDER — SODIUM CHLORIDE 0.9% FLUSH: 3.0000 mL | INTRAVENOUS | Status: DC | PRN

## 2020-11-08 SURGICAL SUPPLY — 9 items
CATH 5F 110X4 TIG (CATHETERS) ×2 IMPLANT
CATH INFINITI 5FR ANG PIGTAIL (CATHETERS) ×2 IMPLANT
DEVICE RAD TR BAND REGULAR (VASCULAR PRODUCTS) ×2 IMPLANT
GLIDESHEATH SLEND SS 6F .021 (SHEATH) ×2 IMPLANT
GUIDEWIRE INQWIRE 1.5J.035X260 (WIRE) IMPLANT
INQWIRE 1.5J .035X260CM (WIRE) ×3
KIT MANI 3VAL PERCEP (MISCELLANEOUS) ×3 IMPLANT
PACK CARDIAC CATH (CUSTOM PROCEDURE TRAY) ×3 IMPLANT
WIRE HITORQ VERSACORE ST 145CM (WIRE) ×2 IMPLANT

## 2020-11-08 NOTE — Progress Notes (Signed)
PROGRESS NOTE   Lindsey Shaw  WUJ:[id number] DOB: 1941/08/18 DOA: 11/07/2020 PCP: Kirk Ruths, MD  Brief Narrative:  60 HTN HLD DM TY 2 reflux Abnormal stress test 2013 CKD 3, T12 vertebral fracture 2016, L4-5 laminectomy 06/2013, right total knee 04/09/2017 Recent nondisplaced left humeral fracture. Followed by orthopedics LLE DVT secondary to fall and immobility gout depression iron deficiency anemia  Presented 11/23 left-sided chest pain 10/10, troponin bumped, cardiology consulted CT found to be negative for PE Cardiology consulted for NSTEMI  Assessment & Plan:   Principal Problem:   NSTEMI (non-ST elevated myocardial infarction) [facility]) Active Problems:   Hyperlipidemia   Essential hypertension   Diabetes mellitus without complication ([place] Shaw)   GERD (gastroesophageal reflux disease)   Gout   Depression   CAD (coronary artery disease)   CKD (chronic kidney disease), stage IIIa   Iron deficiency anemia   Fall at home, initial encounter   Left wrist fracture   1. NSTEMI [facility]) a. Status post cardiac cath--medical management recommended --aspirin given history of recurrent falls b. Continue amlodipine 5 losartan 100 atorvastatin 40 c. Further recommendations per cardiology 2. DM TY 2-HLD a. Holding Metformin XR since post-cath continue sliding scale b. Continue Lipitor 40 at bedtime  c. For neuropathy may continue low-dose gabapentin 100 at bedtime 3. CKD 3 a a. Monitor creatinine-saline lock as per cardiology b. Repeat labs in a.m. especially as is on ARB and risk for volume depletion c.  4. IDA a. Continue ferrous sulfate on discharge 325 at bedtime outpatient labs 5. Gout a. Continue Uloric 80 nightly 6. LLE DVT a. Not currently on anticoagulation presumably because of recurrent falls b. Monitor trends 7. Recent left wrist fracture 1 week prior 8. Osteoporotic fractures a. High risk of fracture-outpatient vitamin D testing and DEXA scan as per  PCP b. Needs to reschedule appointment with orthopedics that was scheduled for 11/23-May require immobility keep brace on 9. Depression a. Continue Celexa 20 at bedtime  DVT prophylaxis: Lovenox Code Status: Full Family Communication: None bedside Disposition:  Status is: Inpatient  Remains inpatient appropriate because:Persistent severe electrolyte disturbances, Unsafe d/c plan and IV treatments appropriate due to intensity of illness or inability to take PO   Dispo: The patient is from: Home              Anticipated d/c is to: Home              Anticipated d/c date is: 2 days              Patient currently is not medically stable to d/c.       Consultants:   Cardiology  Procedures:  Cardiac cath 11/25  Conclusions: 1. Mild to moderate nonobstructive coronary artery disease including sequential 40% ostial and 30% proximal/mid LAD stenoses and sequential 20% proximal and 60% mid LCx stenoses as well as 30% ostial OM1 lesion.  No significant disease noted in the RCA.  Findings are consistent with MI with nonobstructive coronary artery disease (MINOCA). 2. Normal left ventricular systolic function with mildly elevated filling pressure.   Antimicrobials: None   Subjective: Doing fair has a "catching" feeling in her left hip that is chronic Tells me had a fall recently and broke her left wrist No fever no chills No nausea no vomiting  Objective: Vitals:   11/08/20 1130 11/08/20 1145 11/08/20 1200 11/08/20 1215  BP: (!) 147/57 (!) 149/52 (!) 134/52 (!) 138/50  Pulse: 64 (!) 58 (!) 53 (!) 54  Resp: 20 18 14 15   Temp:      TempSrc:      SpO2: 98% 98% 98% 96%  Weight:      Height:        Intake/Output Summary (Last 24 hours) at 11/08/2020 1501 Last data filed at 11/08/2020 1100 Gross per 24 hour  Intake 1499.89 ml  Output 1200 ml  Net 299.89 ml   Filed Weights   11/06/20 2353 11/07/20 1504 11/08/20 0423  Weight: 68 kg 69.9 kg 70.4 kg    Examination: Awake  alert coherent no distress NCAT no focal deficit S1-S2 no murmur Chest clear Abdomen soft No rebound no guarding Neurologically intact however limited on right arm because of radial cast left arm is in a brace   Data Reviewed: I have personally reviewed following labs and imaging studies  BUN/Creatinine 26/1.25->25/1.1 (0.2  Radiology Studies: DG Chest 2 View  Result Date: 11/07/2020 CLINICAL DATA:  Chest pain starting tonight under the left breast. [place] a week ago with injury to the left ribs. EXAM: CHEST - 2 VIEW COMPARISON:  06/21/2013 FINDINGS: Heart size and pulmonary vascularity are normal. Slight linear atelectasis in the left base. No airspace disease or consolidation. Suggestion of bronchiectasis and bronchial wall thickening in the upper lungs possibly indicating bronchitis. No pleural effusions. No pneumothorax. Mediastinal contours appear intact. Visualized ribs are nondepressed. There is incomplete visualization of the left humerus and degenerative changes are present in the left shoulder but there is possible linear nondisplaced fracture of the left humeral neck. Degenerative changes in the spine. Previous vertebral kyphoplasty. IMPRESSION: 1. Probable bronchiectasis and bronchial wall thickening in the upper lungs. No focal consolidation. 2. Possible nondisplaced fracture of the left humeral neck. Electronically Signed   By: Lucienne Capers M.D.   On: 11/07/2020 00:25   CT HEAD WO CONTRAST  Result Date: 11/07/2020 CLINICAL DATA:  Recent fall with headaches, initial encounter EXAM: CT HEAD WITHOUT CONTRAST TECHNIQUE: Contiguous axial images were obtained from the base of the skull through the vertex without intravenous contrast. COMPARISON:  09/14/2020 FINDINGS: Brain: Mild atrophic changes and chronic white matter ischemic changes are seen. Prior lacunar infarct in the left basal ganglia is noted. No findings to suggest acute hemorrhage, acute infarction or space-occupying mass  lesion are seen. Vascular: No hyperdense vessel or unexpected calcification. Skull: Normal. Negative for fracture or focal lesion. Sinuses/Orbits: No acute finding. Other: None. IMPRESSION: Chronic atrophic and ischemic changes stable from the prior exam. No acute abnormality noted. Electronically Signed   By: Inez Catalina M.D.   On: 11/07/2020 09:42   CT Angio Chest PE W/Cm &/Or Wo Cm  Result Date: 11/07/2020 CLINICAL DATA:  Pulmonary embolus suspected with high probability. History of deep venous thrombosis. Recent fall with chest bruising. EXAM: CT ANGIOGRAPHY CHEST WITH CONTRAST TECHNIQUE: Multidetector CT imaging of the chest was performed using the standard protocol during bolus administration of intravenous contrast. Multiplanar CT image reconstructions and MIPs were obtained to evaluate the vascular anatomy. CONTRAST:  78mL OMNIPAQUE IOHEXOL 350 MG/ML SOLN COMPARISON:  Chest radiograph 11/06/2020 FINDINGS: Cardiovascular: Motion artifact limits examination. There is good opacification of the central and segmental pulmonary arteries. No focal filling defects. No evidence of significant pulmonary embolus. Normal heart size. No pericardial effusions. Normal caliber thoracic aorta. No aortic dissection. Great vessel origins are patent. Mediastinum/Nodes: Esophagus is decompressed. No significant lymphadenopathy. Lungs/Pleura: The lungs are clear. No pleural effusions. No pneumothorax. Airways are patent. Upper Abdomen: No acute abnormalities demonstrated in the visualized  upper abdomen. Musculoskeletal: Degenerative changes in the thoracic spine. Lower thoracic vertebral kyphoplasty procedure. No destructive bone lesions. Review of the MIP images confirms the above findings. IMPRESSION: 1. No evidence of significant pulmonary embolus. 2. The lungs are clear. Electronically Signed   By: Lucienne Capers M.D.   On: 11/07/2020 04:36   CARDIAC CATHETERIZATION  Result Date: 11/08/2020 Conclusions: 1. Mild  to moderate nonobstructive coronary artery disease including sequential 40% ostial and 30% proximal/mid LAD stenoses and sequential 20% proximal and 60% mid LCx stenoses as well as 30% ostial OM1 lesion.  No significant disease noted in the RCA.  Findings are consistent with MI with nonobstructive coronary artery disease (MINOCA). 2. Normal left ventricular systolic function with mildly elevated filling pressure. Recommendations: 1. Continue medical therapy and aggressive secondary prevention of coronary artery disease.  Given history of recurrent falls, I favor deferring dual antiplatelet therapy.  Aspirin 81 mg daily should be continued. Nelva Bush, MD [facility]   DG Humerus Left  Result Date: 11/07/2020 CLINICAL DATA:  Fall EXAM: LEFT HUMERUS - 2+ VIEW COMPARISON:  None. FINDINGS: Chronic fracture of the proximal left humerus with callus formation dorsally. No dislocation or acute fracture. IMPRESSION: No acute fracture or dislocation of the left humerus. Electronically Signed   By: Ulyses Jarred M.D.   On: 11/07/2020 03:40   ECHOCARDIOGRAM COMPLETE  Result Date: 11/07/2020    ECHOCARDIOGRAM REPORT   Patient Name:   AYMARA SASSI Date of Exam: 11/07/2020 Medical Rec #:  [id number]           Height:       65.0 in Accession #:    [id number]          Weight:       150.0 lb Date of Birth:  1941-07-31          BSA:          1.750 m Patient Age:    7 years            BP:           165/79 mmHg Patient Gender: F                   HR:           58 bpm. Exam Location:  [facility] Procedure: 2D Echo, Color Doppler and Cardiac Doppler Indications:     I21.4 NSTEMI  History:         Patient has no prior history of Echocardiogram examinations.                  CKD; Risk Factors:Hypertension, Dyslipidemia and Diabetes.  Sonographer:     Charmayne Sheer RDCS (AE) Referring Phys:  Baker Janus Soledad Gerlach NIU Diagnosing Phys: Nelva Bush MD  Sonographer Comments: Suboptimal parasternal window and no subcostal window.  IMPRESSIONS  1. Left ventricular ejection fraction, by estimation, is 55 to 60%. The left ventricle has normal function. Left ventricular endocardial border not optimally defined to evaluate regional wall motion. Left ventricular diastolic parameters are consistent with Grade I diastolic dysfunction (impaired relaxation).  2. Right ventricular systolic function is normal. The right ventricular size is normal. Tricuspid regurgitation signal is inadequate for assessing PA pressure.  3. The mitral valve was not well visualized. Trivial mitral valve regurgitation. No evidence of mitral stenosis.  4. The aortic valve was not well visualized. Aortic valve regurgitation is not visualized. No aortic stenosis is present. FINDINGS  Left Ventricle: Left ventricular ejection fraction, by  estimation, is 55 to 60%. The left ventricle has normal function. Left ventricular endocardial border not optimally defined to evaluate regional wall motion. The left ventricular internal cavity size was normal in size. There is no left ventricular hypertrophy. Left ventricular diastolic parameters are consistent with Grade I diastolic dysfunction (impaired relaxation). Right Ventricle: The right ventricular size is normal. Right vetricular wall thickness was not well visualized. Right ventricular systolic function is normal. Tricuspid regurgitation signal is inadequate for assessing PA pressure. Left Atrium: Left atrial size was normal in size. Right Atrium: Right atrial size was not well visualized. Pericardium: The pericardium was not well visualized. Mitral Valve: The mitral valve was not well visualized. Mild mitral annular calcification. Trivial mitral valve regurgitation. No evidence of mitral valve stenosis. MV peak gradient, 5.4 mmHg. The mean mitral valve gradient is 2.0 mmHg. Tricuspid Valve: The tricuspid valve is not well visualized. Tricuspid valve regurgitation is trivial. Aortic Valve: The aortic valve was not well visualized.  Aortic valve regurgitation is not visualized. No aortic stenosis is present. Aortic valve mean gradient measures 5.0 mmHg. Aortic valve peak gradient measures 8.4 mmHg. Aortic valve area, by VTI measures 1.74 cm. Pulmonic Valve: The pulmonic valve was not well visualized. Pulmonic valve regurgitation is not visualized. No evidence of pulmonic stenosis. Aorta: The aortic root is normal in size and structure. Pulmonary Artery: The pulmonary artery is not well seen. IAS/Shunts: The interatrial septum was not well visualized.  LEFT VENTRICLE PLAX 2D LVIDd:         3.95 cm  Diastology LVIDs:         2.98 cm  LV e' medial:    5.98 cm/s LV PW:         0.85 cm  LV E/e' medial:  13.3 LV IVS:        0.74 cm  LV e' lateral:   5.77 cm/s LVOT diam:     1.90 cm  LV E/e' lateral: 13.7 LV SV:         57 LV SV Index:   33 LVOT Area:     2.84 cm  RIGHT VENTRICLE RV Basal diam:  3.33 cm LEFT ATRIUM             Index LA diam:        3.50 cm 2.00 cm/m LA Vol (A2C):   41.1 ml 23.48 ml/m LA Vol (A4C):   44.0 ml 25.14 ml/m LA Biplane Vol: 42.9 ml 24.51 ml/m  AORTIC VALVE                    PULMONIC VALVE AV Area (Vmax):    1.70 cm     PV Vmax:       1.03 m/s AV Area (Vmean):   1.59 cm     PV Vmean:      70.300 cm/s AV Area (VTI):     1.74 cm     PV VTI:        0.237 m AV Vmax:           145.00 cm/s  PV Peak grad:  4.2 mmHg AV Vmean:          106.000 cm/s PV Mean grad:  2.0 mmHg AV VTI:            0.330 m AV Peak Grad:      8.4 mmHg AV Mean Grad:      5.0 mmHg LVOT Vmax:         86.80 cm/s  LVOT Vmean:        59.400 cm/s LVOT VTI:          0.202 m LVOT/AV VTI ratio: 0.61  AORTA Ao Root diam: 3.00 cm MITRAL VALVE MV Area (PHT): 3.11 cm     SHUNTS MV Peak grad:  5.4 mmHg     Systemic VTI:  0.20 m MV Mean grad:  2.0 mmHg     Systemic Diam: 1.90 cm MV Vmax:       1.16 m/s MV Vmean:      64.2 cm/s MV Decel Time: 244 msec MV E velocity: 79.30 cm/s MV A velocity: 102.00 cm/s MV E/A ratio:  0.78 Harrell Gave End MD Electronically signed by  Nelva Bush MD Signature Date/Time: 11/07/2020/1:17:20 PM    Final      Scheduled Meds: . amLODipine  5 mg Oral Daily  . aspirin EC  81 mg Oral QPM  . atorvastatin  40 mg Oral QPM  . citalopram  20 mg Oral QPM  . diclofenac Sodium  4 g Topical QID  . febuxostat  80 mg Oral QPM  . ferrous sulfate  325 mg Oral QAC supper  . gabapentin  100 mg Oral QHS  . heparin  5,000 Units Subcutaneous Q8H  . influenza vaccine adjuvanted  0.5 mL Intramuscular Tomorrow-1000  . insulin aspart  0-5 Units Subcutaneous QHS  . insulin aspart  0-9 Units Subcutaneous TID WC  . losartan  100 mg Oral QPM  . pantoprazole  40 mg Oral Daily  . sodium chloride flush  3 mL Intravenous Q12H   Continuous Infusions: . sodium chloride       LOS: 1 day    Time spent: Valier, MD [facility] To contact the attending provider between 7A-7P or the covering provider during after hours 7P-7A, please log into the web site [email or web address] and access using universal [place] password for that web site. If you do not have the password, please call the hospital operator.  11/08/2020, 3:01 PM

## 2020-11-08 NOTE — Progress Notes (Signed)
[place] for Heparin Indication: chest pain/ACS  Allergies  Allergen Reactions  . Keflex [Cephalexin] Swelling    SWELLING REACTION UNSPECIFIED    Patient Measurements: Height: 5\' 5"  (165.1 cm) Weight: 70.4 kg (155 lb 1.6 oz) IBW/kg (Calculated) : 57 HEPARIN DW (KG): 69.9  Vital Signs: Temp: 97.7 F (36.5 C) (11/24 0423) Temp Source: Oral (11/24 0423) BP: 133/76 (11/24 0423) Pulse Rate: 60 (11/24 0423)  Labs: Recent Labs    11/06/20 2356 11/06/20 2356 11/07/20 0238 11/07/20 0527 11/07/20 0818 11/07/20 1330 11/07/20 1352 11/07/20 1526 11/07/20 2322 11/08/20 0454  HGB 11.2*   < >  --   --   --  10.9*  --   --   --  10.2*  HCT 34.3*  --   --   --   --  33.2*  --   --   --  31.1*  PLT 240  --   --   --   --  219  --   --   --  217  APTT  --   --   --  31  --   --   --   --   --   --   LABPROT  --   --   --  12.5  --   --   --   --   --   --   INR  --   --   --  1.0  --   --   --   --   --   --   HEPARINUNFRC  --   --   --   --   --  0.58  --   --  0.45 0.42  CREATININE 1.25*  --   --   --   --   --   --   --   --  1.12*  TROPONINIHS 61*  --    < >  --  430*  --  304* 266*  --   --    < > = values in this interval not displayed.    Estimated Creatinine Clearance: 41.4 mL/min (A) (by C-G formula based on SCr of 1.12 mg/dL (H)).   Medical History: Past Medical History:  Diagnosis Date  . Anxiety   . Arthritis   . Cancer ([place]) 2005, 2010   MELANOMA   . Chronic kidney disease    Stage 3 Kidney Disease  . Depression   . Diabetes mellitus without complication ([place])   . DVT (deep venous thrombosis) ([place]) 2006   from fall  . GERD (gastroesophageal reflux disease)   . Gout   . History of stress test    10+ YRS (Lexiscan: no evid of ischemia/infarct, EF 72% 06/23/12 [facility])  . Hyperlipidemia   . Hypertension   . Mental disorder     Medications:  Medications Prior to Admission  Medication Sig Dispense Refill Last Dose  .  amLODipine (NORVASC) 5 MG tablet Take 5 mg by mouth daily.   11/06/2020 at Unknown time  . aspirin EC 81 MG tablet Take 81 mg by mouth every evening.   11/06/2020 at Unknown time  . atorvastatin (LIPITOR) 40 MG tablet Take 40 mg by mouth every evening.   11/06/2020 at Unknown time  . citalopram (CELEXA) 20 MG tablet Take 20 mg by mouth every evening.   11/06/2020 at Unknown time  . diclofenac Sodium (VOLTAREN) 1 % GEL Apply 4 g topically 4 (four)  times daily.   11/06/2020 at Unknown time  . Febuxostat (ULORIC) 80 MG TABS Take 80 mg by mouth every evening.   11/06/2020 at Unknown time  . ferrous sulfate 325 (65 FE) MG tablet Take 325 mg by mouth daily before supper.   11/06/2020 at Unknown time  . gabapentin (NEURONTIN) 100 MG capsule Take 100 mg by mouth at bedtime.    11/06/2020 at Unknown time  . losartan (COZAAR) 100 MG tablet Take 100 mg by mouth every evening.    11/06/2020 at Unknown time  . metFORMIN (GLUCOPHAGE-XR) 500 MG 24 hr tablet Take 500 mg by mouth daily before supper.   11/06/2020 at Unknown time  . omeprazole (PRILOSEC) 20 MG capsule Take 20 mg by mouth every evening.   11/06/2020 at Unknown time  . acetaminophen (TYLENOL) 500 MG tablet Take 500-1,000 mg by mouth every 6 (six) hours as needed (for pain.). (Patient not taking: Reported on 11/07/2020)   Not Taking at Unknown time  . cyanocobalamin 1000 MCG tablet Take 1,000 mcg by mouth daily. (Patient not taking: Reported on 11/07/2020)   Not Taking at Unknown time    Assessment: Heparin for ACS.  No anticoagulants PTA per med list.   11/23 1330 HL 0.58  11/23 2322 HL 0.45 11/24 0454 HL 0.42, therapeutic  Goal of Therapy:  Heparin level 0.3-0.7 units/ml Monitor platelets by anticoagulation protocol: Yes   Plan:  Heparin level is therapeutic. Will continue heparin infusion at 950 units/hr. Recheck heparin level in am. CBC daily while on heparin.   Ena Dawley, PharmD 11/08/2020,5:57 AM

## 2020-11-08 NOTE — Progress Notes (Signed)
[place] for Heparin Indication: chest pain/ACS  Allergies  Allergen Reactions  . Keflex [Cephalexin] Swelling    SWELLING REACTION UNSPECIFIED    Patient Measurements: Height: 5\' 5"  (165.1 cm) Weight: 69.9 kg (154 lb) IBW/kg (Calculated) : 57 HEPARIN DW (KG): 69.9  Vital Signs: Temp: 98 F (36.7 C) (11/23 1953) Temp Source: Oral (11/23 1953) BP: 132/67 (11/23 1953) Pulse Rate: 57 (11/23 1953)  Labs: Recent Labs    11/06/20 2356 11/07/20 0238 11/07/20 0527 11/07/20 0818 11/07/20 1330 11/07/20 1352 11/07/20 1526 11/07/20 2322  HGB 11.2*  --   --   --  10.9*  --   --   --   HCT 34.3*  --   --   --  33.2*  --   --   --   PLT 240  --   --   --  219  --   --   --   APTT  --   --  31  --   --   --   --   --   LABPROT  --   --  12.5  --   --   --   --   --   INR  --   --  1.0  --   --   --   --   --   HEPARINUNFRC  --   --   --   --  0.58  --   --  0.45  CREATININE 1.25*  --   --   --   --   --   --   --   TROPONINIHS 61*   < >  --  430*  --  304* 266*  --    < > = values in this interval not displayed.    Estimated Creatinine Clearance: 37 mL/min (A) (by C-G formula based on SCr of 1.25 mg/dL (H)).   Medical History: Past Medical History:  Diagnosis Date  . Anxiety   . Arthritis   . Cancer ([place]) 2005, 2010   MELANOMA   . Chronic kidney disease    Stage 3 Kidney Disease  . Depression   . Diabetes mellitus without complication ([place])   . DVT (deep venous thrombosis) ([place]) 2006   from fall  . GERD (gastroesophageal reflux disease)   . Gout   . History of stress test    10+ YRS (Lexiscan: no evid of ischemia/infarct, EF 72% 06/23/12 [facility])  . Hyperlipidemia   . Hypertension   . Mental disorder     Medications:  Medications Prior to Admission  Medication Sig Dispense Refill Last Dose  . amLODipine (NORVASC) 5 MG tablet Take 5 mg by mouth daily.   11/06/2020 at Unknown time  . aspirin EC 81 MG tablet Take 81 mg by  mouth every evening.   11/06/2020 at Unknown time  . atorvastatin (LIPITOR) 40 MG tablet Take 40 mg by mouth every evening.   11/06/2020 at Unknown time  . citalopram (CELEXA) 20 MG tablet Take 20 mg by mouth every evening.   11/06/2020 at Unknown time  . diclofenac Sodium (VOLTAREN) 1 % GEL Apply 4 g topically 4 (four) times daily.   11/06/2020 at Unknown time  . Febuxostat (ULORIC) 80 MG TABS Take 80 mg by mouth every evening.   11/06/2020 at Unknown time  . ferrous sulfate 325 (65 FE) MG tablet Take 325 mg by mouth daily before supper.   11/06/2020 at Unknown time  .  gabapentin (NEURONTIN) 100 MG capsule Take 100 mg by mouth at bedtime.    11/06/2020 at Unknown time  . losartan (COZAAR) 100 MG tablet Take 100 mg by mouth every evening.    11/06/2020 at Unknown time  . metFORMIN (GLUCOPHAGE-XR) 500 MG 24 hr tablet Take 500 mg by mouth daily before supper.   11/06/2020 at Unknown time  . omeprazole (PRILOSEC) 20 MG capsule Take 20 mg by mouth every evening.   11/06/2020 at Unknown time  . acetaminophen (TYLENOL) 500 MG tablet Take 500-1,000 mg by mouth every 6 (six) hours as needed (for pain.). (Patient not taking: Reported on 11/07/2020)   Not Taking at Unknown time  . cyanocobalamin 1000 MCG tablet Take 1,000 mcg by mouth daily. (Patient not taking: Reported on 11/07/2020)   Not Taking at Unknown time    Assessment: Heparin for ACS.  No anticoagulants PTA per med list.   11/23 1330 HL 0.58  11/23 2322 HL 0.45  Goal of Therapy:  Heparin level 0.3-0.7 units/ml Monitor platelets by anticoagulation protocol: Yes   Plan:  Heparin level is therapeutic. Will continue heparin infusion at 950 units/hr. Recheck heparin level in am. CBC daily while on heparin.   Ena Dawley, PharmD 11/08/2020,12:52 AM

## 2020-11-08 NOTE — Telephone Encounter (Signed)
-----   Message from Nelva Bush, MD sent at 11/08/2020 10:20 AM EST ----- Regarding: Hospital f/u Good morning,Could you schedule Lindsey Shaw for follow-up with me or an APP in 2 weeks?  She will likely be discharged home later today.  Thanks.Gerald Stabs

## 2020-11-08 NOTE — Progress Notes (Signed)
Progress Note  Patient Name: Lindsey Shaw Date of Encounter: 11/08/2020  Primary Cardiologist: Rockey Situ  Subjective   No further chest pain. No SOB. She is for LHC this morning. Pre-cath labs ok. BP elevated overnight, improved this morning.   Inpatient Medications    Scheduled Meds: . amLODipine  5 mg Oral Daily  . aspirin EC  81 mg Oral QPM  . atorvastatin  40 mg Oral QPM  . citalopram  20 mg Oral QPM  . diclofenac Sodium  4 g Topical QID  . febuxostat  80 mg Oral QPM  . ferrous sulfate  325 mg Oral QAC supper  . gabapentin  100 mg Oral QHS  . influenza vaccine adjuvanted  0.5 mL Intramuscular Tomorrow-1000  . insulin aspart  0-5 Units Subcutaneous QHS  . insulin aspart  0-9 Units Subcutaneous TID WC  . losartan  100 mg Oral QPM  . pantoprazole  40 mg Oral Daily  . sodium chloride flush  3 mL Intravenous Q12H   Continuous Infusions: . sodium chloride 50 mL/hr at 11/08/20 0628  . sodium chloride    . sodium chloride    . heparin 950 Units/hr (11/08/20 0628)   PRN Meds: sodium chloride, acetaminophen, albuterol, dextromethorphan-guaiFENesin, hydrALAZINE, morphine injection, nitroGLYCERIN, ondansetron (ZOFRAN) IV, sodium chloride flush   Vital Signs    Vitals:   11/07/20 1504 11/07/20 1505 11/07/20 1953 11/08/20 0423  BP:  (!) 176/72 132/67 133/76  Pulse:  60 (!) 57 60  Resp:  17    Temp:  98.4 F (36.9 C) 98 F (36.7 C) 97.7 F (36.5 C)  TempSrc:  Oral Oral Oral  SpO2:  98% 98% 97%  Weight: 69.9 kg   70.4 kg  Height: 5\' 5"  (1.651 m)       Intake/Output Summary (Last 24 hours) at 11/08/2020 0727 Last data filed at 11/08/2020 1829 Gross per 24 hour  Intake 1259.89 ml  Output 1200 ml  Net 59.89 ml   Filed Weights   11/06/20 2353 11/07/20 1504 11/08/20 0423  Weight: 68 kg 69.9 kg 70.4 kg    Telemetry    Sinus rhythm with heart rates in the 40s to 60s bpm - Personally Reviewed  ECG    Sinus bradycardia, 58 bpm, left axis deviation, poor R  wave progression along the precordial leads, nonspecific st/t changes - Personally Reviewed  Physical Exam   GEN: No acute distress.   Neck: No JVD. Cardiac: RRR, no murmurs, rubs, or gallops.  Respiratory: Clear to auscultation bilaterally.  GI: Soft, nontender, non-distended.   MS: No edema; No deformity. Neuro:  Alert and oriented x 3; Nonfocal.  Psych: Normal affect.  Labs    Chemistry Recent Labs  Lab 11/06/20 2356 11/08/20 0454  NA 136 137  K 4.0 4.7  CL 103 105  CO2 23 25  GLUCOSE 164* 137*  BUN 26* 25*  CREATININE 1.25* 1.12*  CALCIUM 9.4 8.9  PROT 7.7  --   ALBUMIN 4.1  --   AST 18  --   ALT 11  --   ALKPHOS 94  --   BILITOT 0.7  --   GFRNONAA 44* 51*  ANIONGAP 10 7     Hematology Recent Labs  Lab 11/06/20 2356 11/07/20 1330 11/08/20 0454  WBC 7.1 6.1 5.0  RBC 3.61* 3.51* 3.30*  HGB 11.2* 10.9* 10.2*  HCT 34.3* 33.2* 31.1*  MCV 95.0 94.6 94.2  MCH 31.0 31.1 30.9  MCHC 32.7 32.8 32.8  RDW  12.7 13.0 12.9  PLT 240 219 217    Cardiac EnzymesNo results for input(s): TROPONINI in the last 168 hours. No results for input(s): TROPIPOC in the last 168 hours.   BNPNo results for input(s): BNP, PROBNP in the last 168 hours.   DDimer No results for input(s): DDIMER in the last 168 hours.   Radiology    DG Chest 2 View  Result Date: 11/07/2020 IMPRESSION: 1. Probable bronchiectasis and bronchial wall thickening in the upper lungs. No focal consolidation. 2. Possible nondisplaced fracture of the left humeral neck. Electronically Signed   By: Lucienne Capers M.D.   On: 11/07/2020 00:25   CT HEAD WO CONTRAST  Result Date: 11/07/2020 IMPRESSION: Chronic atrophic and ischemic changes stable from the prior exam. No acute abnormality noted. Electronically Signed   By: Inez Catalina M.D.   On: 11/07/2020 09:42   CT Angio Chest PE W/Cm &/Or Wo Cm  Result Date: 11/07/2020 IMPRESSION: 1. No evidence of significant pulmonary embolus. 2. The lungs are clear.  Electronically Signed   By: Lucienne Capers M.D.   On: 11/07/2020 04:36   DG Humerus Left  Result Date: 11/07/2020 IMPRESSION: No acute fracture or dislocation of the left humerus. Electronically Signed   By: Ulyses Jarred M.D.   On: 11/07/2020 03:40    Cardiac Studies   2D echo 11/07/2020: 1. Left ventricular ejection fraction, by estimation, is 55 to 60%. The  left ventricle has normal function. Left ventricular endocardial border  not optimally defined to evaluate regional wall motion. Left ventricular  diastolic parameters are consistent  with Grade I diastolic dysfunction (impaired relaxation).  2. Right ventricular systolic function is normal. The right ventricular  size is normal. Tricuspid regurgitation signal is inadequate for assessing  PA pressure.  3. The mitral valve was not well visualized. Trivial mitral valve  regurgitation. No evidence of mitral stenosis.  4. The aortic valve was not well visualized. Aortic valve regurgitation  is not visualized. No aortic stenosis is present.  Patient Profile     79 y.o. female with history of CKD stage III, DM2, HTN, HLD, left lower extremity DVT in the setting of a fall, and GERD who is being seen today for the evaluation of NSTEMI at the request of Dr. Blaine Hamper.  Assessment & Plan    1. NSTEMI with stuttering chest pain: -Overall, her chest pain features are quite atypical in that they are present after sustaining a mechanical fall to the outside, nonexertional, worse with positional movement/rotation, and reproducible to palpation on exam -Initially, plans were for Lexiscan MPI, though after she discussed her case with her daughters, they wanted to proceed with diagnostic LHC -HS-Tn peaked at 22 and is down trending -Currently, chest pain free -She does have some underlying CAD risk factors including coronary artery calcification on noninvasive imaging, DM, HTN, and HLD -EKG without acute ischemic changes and echo with  preserved LVSF and no obvious WMA -NPO -LHC this morning with Dr. Saunders Revel -ASA -Heparin gtt  -Lipitor  -Sinus bradycardia precludes addition of beta blocker at this time -Risks and benefits of cardiac catheterization have been discussed with the patient including risks of bleeding, bruising, infection, kidney damage, stroke, heart attack, urgent need for bypass surgery, injury to a limb, and death. The patient understands these risks and is willing to proceed with the procedure. All questions have been answered and concerns listened to  2. CKD stage III: -Renal function improving  -Monitor   3. HTN: -Blood pressure  mildly elevated overnight, though improved this morning -She remains on amlodipine and losartan -If needed, amlodipine can be titrated to 10 mg  4. HLD: -LDL 69 this admission -PTA Lipitor  5. Falls: -Multiple falls recently  -Consider PT/OT evaluation -Management per IM  For questions or updates, please contact [place] Please consult [email or web address] for contact info under Cardiology/STEMI.    Signed, Christell Faith, PA-C [place] Pager: [phone] 11/08/2020, 7:27 AM

## 2020-11-08 NOTE — Plan of Care (Signed)
  Problem: Clinical Measurements: Goal: Ability to maintain clinical measurements within normal limits will improve Outcome: Progressing   Problem: Pain Managment: Goal: General experience of comfort will improve Outcome: Progressing  No reports of chest pain this shift.

## 2020-11-08 NOTE — Telephone Encounter (Signed)
Patient still in hospital  Family wants to wait to schedule with patient when home

## 2020-11-08 NOTE — Interval H&P Note (Signed)
History and Physical Interval Note:  11/08/2020 9:23 AM  [place]  has presented today for surgery, with the diagnosis of NSTEMI.  The various methods of treatment have been discussed with the patient and family. After consideration of risks, benefits and other options for treatment, the patient has consented to  Procedure(s): LEFT HEART CATH AND CORONARY ANGIOGRAPHY (N/A) as a surgical intervention.  The patient's history has been reviewed, patient examined, no change in status, stable for surgery.  I have reviewed the patient's chart and labs.  Questions were answered to the patient's satisfaction.    Cath Lab Visit (complete for each Cath Lab visit)  Clinical Evaluation Leading to the Procedure:   ACS: Yes.    Non-ACS:  N/A  Panzy Bubeck

## 2020-11-09 ENCOUNTER — Encounter: Payer: Self-pay | Admitting: Internal Medicine

## 2020-11-09 LAB — GLUCOSE, CAPILLARY
Glucose-Capillary: 123 mg/dL — ABNORMAL HIGH (ref 70–99)
Glucose-Capillary: 105 mg/dL — ABNORMAL HIGH (ref 70–99)

## 2020-11-09 LAB — CBC WITH DIFFERENTIAL/PLATELET
Abs Immature Granulocytes: 0.01 10*3/uL (ref 0.00–0.07)
Basophils Absolute: 0 10*3/uL (ref 0.0–0.1)
Basophils Relative: 1 %
Eosinophils Absolute: 0.1 10*3/uL (ref 0.0–0.5)
Eosinophils Relative: 3 %
HCT: 31.7 % — ABNORMAL LOW (ref 36.0–46.0)
Hemoglobin: 10.7 g/dL — ABNORMAL LOW (ref 12.0–15.0)
Immature Granulocytes: 0 %
Lymphocytes Relative: 31 %
Lymphs Abs: 1.6 10*3/uL (ref 0.7–4.0)
MCH: 31.2 pg (ref 26.0–34.0)
MCHC: 33.8 g/dL (ref 30.0–36.0)
MCV: 92.4 fL (ref 80.0–100.0)
Monocytes Absolute: 0.3 10*3/uL (ref 0.1–1.0)
Monocytes Relative: 6 %
Neutro Abs: 3.2 10*3/uL (ref 1.7–7.7)
Neutrophils Relative %: 59 %
Platelets: 224 10*3/uL (ref 150–400)
RBC: 3.43 MIL/uL — ABNORMAL LOW (ref 3.87–5.11)
RDW: 12.9 % (ref 11.5–15.5)
WBC: 5.2 10*3/uL (ref 4.0–10.5)
nRBC: 0 % (ref 0.0–0.2)

## 2020-11-09 LAB — COMPREHENSIVE METABOLIC PANEL
ALT: 13 U/L (ref 0–44)
AST: 15 U/L (ref 15–41)
Albumin: 3.6 g/dL (ref 3.5–5.0)
Alkaline Phosphatase: 91 U/L (ref 38–126)
Anion gap: 9 (ref 5–15)
BUN: 28 mg/dL — ABNORMAL HIGH (ref 8–23)
CO2: 25 mmol/L (ref 22–32)
Calcium: 9.3 mg/dL (ref 8.9–10.3)
Chloride: 102 mmol/L (ref 98–111)
Creatinine, Ser: 1.13 mg/dL — ABNORMAL HIGH (ref 0.44–1.00)
GFR, Estimated: 50 mL/min — ABNORMAL LOW (ref >60)
Glucose, Bld: 137 mg/dL — ABNORMAL HIGH (ref 70–99)
Potassium: 4.4 mmol/L (ref 3.5–5.1)
Sodium: 136 mmol/L (ref 135–145)
Total Bilirubin: 0.8 mg/dL (ref 0.3–1.2)
Total Protein: 6.6 g/dL (ref 6.5–8.1)

## 2020-11-09 NOTE — Plan of Care (Signed)
Pt ready for discharge  IV and tele removed Discharge instructions reviewed with patient and daughter Time allowed for questions and concerns Verbalizes an understanding.  Denies any additional wants or needs at this time  Pt left unit via wheelchair.    Problem: Education: Goal: Knowledge of General Education information will improve Description: Including pain rating scale, medication(s)/side effects and non-pharmacologic comfort measures Outcome: Adequate for Discharge   Problem: Health Behavior/Discharge Planning: Goal: Ability to manage health-related needs will improve Outcome: Adequate for Discharge   Problem: Clinical Measurements: Goal: Ability to maintain clinical measurements within normal limits will improve Outcome: Adequate for Discharge Goal: Will remain free from infection Outcome: Adequate for Discharge Goal: Diagnostic test results will improve Outcome: Adequate for Discharge Goal: Respiratory complications will improve Outcome: Adequate for Discharge Goal: Cardiovascular complication will be avoided Outcome: Adequate for Discharge   Problem: Activity: Goal: Risk for activity intolerance will decrease Outcome: Adequate for Discharge   Problem: Nutrition: Goal: Adequate nutrition will be maintained Outcome: Adequate for Discharge   Problem: Coping: Goal: Level of anxiety will decrease Outcome: Adequate for Discharge   Problem: Elimination: Goal: Will not experience complications related to bowel motility Outcome: Adequate for Discharge Goal: Will not experience complications related to urinary retention Outcome: Adequate for Discharge   Problem: Pain Managment: Goal: General experience of comfort will improve Outcome: Adequate for Discharge   Problem: Safety: Goal: Ability to remain free from injury will improve Outcome: Adequate for Discharge   Problem: Skin Integrity: Goal: Risk for impaired skin integrity will decrease Outcome: Adequate  for Discharge   Problem: Education: Goal: Understanding of CV disease, CV risk reduction, and recovery process will improve Outcome: Adequate for Discharge Goal: Individualized Educational Video(s) Outcome: Adequate for Discharge   Problem: Cardiovascular: Goal: Ability to achieve and maintain adequate cardiovascular perfusion will improve Outcome: Adequate for Discharge Goal: Vascular access site(s) Level 0-1 will be maintained Outcome: Adequate for Discharge

## 2020-11-09 NOTE — Progress Notes (Signed)
Physical Therapy Evaluation Patient Details Name: Lindsey Shaw MRN: [id number] DOB: 24-Aug-1941 Today's Date: 11/09/2020   History of Present Illness  Patient is s/p LEFT HEART CATH AND CORONARY ANGIOGRAPHY (N/A) as a surgical intervention.    Clinical Impression  Patient agrees to PT evaluation. Pt reports no pain. Pt lives with granddaughter with 3  steps and rail entry into her home. Pt ambulated without AD prior to this hospital admission. Pt has 4/5 strength BLE hip and knee and is I for bed mobility, I for transfers sit to stand without AD. Pt ambulates without AD 200 feet I and no reports of pain. Pt has WNL static sitting and standing balance. Patient has no skilled PT needs at this time and will be discharged from PT.      Follow Up Recommendations No PT follow up    Equipment Recommendations  None recommended by PT    Recommendations for Other Services       Precautions / Restrictions Restrictions Weight Bearing Restrictions: No      Mobility  Bed Mobility Overal bed mobility: Independent                  Transfers Overall transfer level: Independent Equipment used: None             General transfer comment:  (steady)  Ambulation/Gait Ambulation/Gait assistance: Independent Gait Distance (Feet): 200 Feet Assistive device: None Gait Pattern/deviations: Step-through pattern        Stairs            Wheelchair Mobility    Modified Rankin (Stroke Patients Only)       Balance Overall balance assessment: Independent                                           Pertinent Vitals/Pain Pain Assessment: No/denies pain    Home Living Family/patient expects to be discharged to:: Private residence Living Arrangements: Children Available Help at Discharge: Family Type of Home: House Home Access: Stairs to enter Entrance Stairs-Rails: Right Entrance Stairs-Number of Steps: 3 Home Layout: One level Home Equipment:  Cane - single point      Prior Function Level of Independence: Independent               Hand Dominance        Extremity/Trunk Assessment   Upper Extremity Assessment Upper Extremity Assessment: LUE deficits/detail LUE Deficits / Details: wrist and hand in splint    Lower Extremity Assessment Lower Extremity Assessment: Overall WFL for tasks assessed       Communication   Communication: No difficulties  Cognition Arousal/Alertness: Awake/alert Behavior During Therapy: WFL for tasks assessed/performed Overall Cognitive Status: Within Functional Limits for tasks assessed                                        General Comments      Exercises     Assessment/Plan    PT Assessment Patent does not need any further PT services  PT Problem List         PT Treatment Interventions      PT Goals (Current goals can be found in the Care Plan section)  Acute Rehab PT Goals Patient Stated Goal: to go home PT Goal Formulation: All assessment and education  complete, DC therapy    Frequency     Barriers to discharge        Co-evaluation               AM-PAC PT "6 Clicks" Mobility  Outcome Measure Help needed turning from your back to your side while in a flat bed without using bedrails?: None Help needed moving from lying on your back to sitting on the side of a flat bed without using bedrails?: None Help needed moving to and from a bed to a chair (including a wheelchair)?: None Help needed standing up from a chair using your arms (e.g., wheelchair or bedside chair)?: None Help needed to walk in hospital room?: None Help needed climbing 3-5 steps with a railing? : None 6 Click Score: 24    End of Session Equipment Utilized During Treatment: Gait belt Activity Tolerance: Patient tolerated treatment well Patient left: in bed;with bed alarm set;with nursing/sitter in room Nurse Communication: Mobility status PT Visit Diagnosis: Difficulty  in walking, not elsewhere classified (R26.2)    Time: 4730-8569 PT Time Calculation (min) (ACUTE ONLY): 15 min   Charges:   PT Evaluation $PT Eval Low Complexity: 1 Low            Brailen Macneal, Sherryl Barters, PT DPT 11/09/2020, 10:00 AM

## 2020-11-09 NOTE — Discharge Summary (Signed)
Physician Discharge Summary  Lindsey Shaw PPJ:[id number] DOB: 11-08-1941 DOA: 11/07/2020  PCP: Kirk Ruths, MD  Admit date: 11/07/2020 Discharge date: 11/09/2020  Time spent: 26 minutes  Recommendations for Outpatient Follow-up:  1. Requires Chem-12 CBC 1 week 2. Requires outpatient follow-up with Dr. Saunders Revel cardiology and has been placed on aspirin this admission without the changes to meds 3. Will require outpatient orthopedic follow-up  Discharge Diagnoses:  Principal Problem:   NSTEMI (non-ST elevated myocardial infarction) ([place]) Active Problems:   Hyperlipidemia   Essential hypertension   Diabetes mellitus without complication (HCC)   GERD (gastroesophageal reflux disease)   Gout   Depression   CAD (coronary artery disease)   CKD (chronic kidney disease), stage IIIa   Iron deficiency anemia   Fall at home, initial encounter   Left wrist fracture   Discharge Condition: Improved  Diet recommendation: Heart healthy  Filed Weights   11/07/20 1504 11/08/20 0423 11/09/20 0418  Weight: 69.9 kg 70.4 kg 70.5 kg    History of present illness:  79 HTN HLD DM TY 2 reflux Abnormal stress test 2013 CKD 3, T12 vertebral fracture 2016, L4-5 laminectomy 06/2013, right total knee 04/09/2017 Recent nondisplaced left humeral fracture. Followed by orthopedics LLE DVT secondary to fall and immobility gout depression iron deficiency anemia  Presented 11/23 left-sided chest pain 10/10, troponin bumped, cardiology consulted CT found to be negative for PE Cardiology consulted for NSTEMI  Hospital Course:  1. NSTEMI [facility]) a. Status post cardiac cath--medical management recommended --aspirin only given on discharge secondary to history of traumatic fall b. Continue amlodipine 5 losartan 100 atorvastatin 40 2. DM TY 2-HLD a. Holding Metformin XR since post-cath continue sliding scale b. Continue Lipitor 40 at bedtime  c. For neuropathy may continue low-dose gabapentin  100 at bedtime 3. CKD 3 a a. Stable during hospital stay and did not rise so continuing all meds without change 4. IDA a. Continue ferrous sulfate on discharge 325 at bedtime outpatient labs 5. Gout a. Continue Uloric 80 nightly 6. LLE DVT a. Not currently on anticoagulation presumably because of recurrent falls b. Monitor trends 7. Recent left wrist fracture 1 week prior 8. Osteoporotic fractures a. High risk of fracture-outpatient vitamin D testing and DEXA scan as per PCP b. Needs to reschedule appointment with orthopedics that was scheduled for 11/23-May require immobility keep brace on 9. Depression a. Continue Celexa 20 at bedtime  Consultants:   Cardiology  Procedures:  Cardiac cath 11/25  Conclusions: 1. Mild to moderate nonobstructive coronary artery disease including sequential 40% ostial and 30% proximal/mid LAD stenoses and sequential 20% proximal and 60% mid LCx stenoses as well as 30% ostial OM1 lesion. No significant disease noted in the RCA. Findings are consistent with MI with nonobstructive coronary artery disease (MINOCA). 2. Normal upper extremity ventricular systolic function with mildly elevated filling pressure.   Discharge Exam: Vitals:   11/09/20 0418 11/09/20 0723  BP: (!) 152/78 (!) 177/99  Pulse: (!) 58 60  Resp:    Temp: 97.9 F (36.6 C) 98.2 F (36.8 C)  SpO2: 98% 100%    General: Awake pleasant alert no distress EOMI NCAT no focal deficit Coherent without any new issues Walked around the hallway without any deficits Cardiovascular: S1-S2 no murmur no rub no gallop Respiratory: Clinically clear no added sound Right wrist does not have any hematoma Has a brace on on the left upper extremity  Discharge Instructions   Discharge Instructions    AMB Referral to Cardiac  Rehabilitation - Phase II   Complete by: As directed    Diagnosis: NSTEMI   After initial evaluation and assessments completed: Virtual Based Care may be provided  alone or in conjunction with Phase 2 Cardiac Rehab based on patient barriers.: Yes   Diet - low sodium heart healthy   Complete by: As directed    Discharge instructions   Complete by: As directed    Please follow-up with cardiology in the outpatient setting with regards to further follow-up for your heart disease The main changes to your medication are addition of aspirin-please ensure that you take this regularly and preferably with something on your stomach as it can irritate your belly There are no other new changes-please follow-up with your regular physician for lab work in about 1 week from discharge and get follow-up with orthopedics for your right hand   Increase activity slowly   Complete by: As directed      Allergies as of 11/09/2020      Reactions   Keflex [cephalexin] Swelling   SWELLING REACTION UNSPECIFIED       Medication List    TAKE these medications   acetaminophen 500 MG tablet Commonly known as: TYLENOL Take 500-1,000 mg by mouth every 6 (six) hours as needed (for pain.).   amLODipine 5 MG tablet Commonly known as: NORVASC Take 5 mg by mouth daily.   aspirin EC 81 MG tablet Take 81 mg by mouth every evening.   atorvastatin 40 MG tablet Commonly known as: LIPITOR Take 40 mg by mouth every evening.   citalopram 20 MG tablet Commonly known as: CELEXA Take 20 mg by mouth every evening.   cyanocobalamin 1000 MCG tablet Take 1,000 mcg by mouth daily.   ferrous sulfate 325 (65 FE) MG tablet Take 325 mg by mouth daily before supper.   gabapentin 100 MG capsule Commonly known as: NEURONTIN Take 100 mg by mouth at bedtime.   losartan 100 MG tablet Commonly known as: COZAAR Take 100 mg by mouth every evening.   metFORMIN 500 MG 24 hr tablet Commonly known as: GLUCOPHAGE-XR Take 500 mg by mouth daily before supper.   omeprazole 20 MG capsule Commonly known as: PRILOSEC Take 20 mg by mouth every evening.   Uloric 80 MG Tabs Generic drug:  Febuxostat Take 80 mg by mouth every evening.   Voltaren 1 % Gel Generic drug: diclofenac Sodium Apply 4 g topically 4 (four) times daily.      Allergies  Allergen Reactions  . Keflex [Cephalexin] Swelling    SWELLING REACTION UNSPECIFIED       The results of significant diagnostics from this hospitalization (including imaging, microbiology, ancillary and laboratory) are listed below for reference.    Significant Diagnostic Studies: DG Chest 2 View  Result Date: 11/07/2020 CLINICAL DATA:  Chest pain starting tonight under the left breast. [place] a week ago with injury to the left ribs. EXAM: CHEST - 2 VIEW COMPARISON:  06/21/2013 FINDINGS: Heart size and pulmonary vascularity are normal. Slight linear atelectasis in the left base. No airspace disease or consolidation. Suggestion of bronchiectasis and bronchial wall thickening in the upper lungs possibly indicating bronchitis. No pleural effusions. No pneumothorax. Mediastinal contours appear intact. Visualized ribs are nondepressed. There is incomplete visualization of the left humerus and degenerative changes are present in the left shoulder but there is possible linear nondisplaced fracture of the left humeral neck. Degenerative changes in the spine. Previous vertebral kyphoplasty. IMPRESSION: 1. Probable bronchiectasis and bronchial wall thickening in  the upper lungs. No focal consolidation. 2. Possible nondisplaced fracture of the left humeral neck. Electronically Signed   By: Lucienne Capers M.D.   On: 11/07/2020 00:25   CT HEAD WO CONTRAST  Result Date: 11/07/2020 CLINICAL DATA:  Recent fall with headaches, initial encounter EXAM: CT HEAD WITHOUT CONTRAST TECHNIQUE: Contiguous axial images were obtained from the base of the skull through the vertex without intravenous contrast. COMPARISON:  09/14/2020 FINDINGS: Brain: Mild atrophic changes and chronic white matter ischemic changes are seen. Prior lacunar infarct in the left basal  ganglia is noted. No findings to suggest acute hemorrhage, acute infarction or space-occupying mass lesion are seen. Vascular: No hyperdense vessel or unexpected calcification. Skull: Normal. Negative for fracture or focal lesion. Sinuses/Orbits: No acute finding. Other: None. IMPRESSION: Chronic atrophic and ischemic changes stable from the prior exam. No acute abnormality noted. Electronically Signed   By: Inez Catalina M.D.   On: 11/07/2020 09:42   CT Angio Chest PE W/Cm &/Or Wo Cm  Result Date: 11/07/2020 CLINICAL DATA:  Pulmonary embolus suspected with high probability. History of deep venous thrombosis. Recent fall with chest bruising. EXAM: CT ANGIOGRAPHY CHEST WITH CONTRAST TECHNIQUE: Multidetector CT imaging of the chest was performed using the standard protocol during bolus administration of intravenous contrast. Multiplanar CT image reconstructions and MIPs were obtained to evaluate the vascular anatomy. CONTRAST:  19mL OMNIPAQUE IOHEXOL 350 MG/ML SOLN COMPARISON:  Chest radiograph 11/06/2020 FINDINGS: Cardiovascular: Motion artifact limits examination. There is good opacification of the central and segmental pulmonary arteries. No focal filling defects. No evidence of significant pulmonary embolus. Normal heart size. No pericardial effusions. Normal caliber thoracic aorta. No aortic dissection. Great vessel origins are patent. Mediastinum/Nodes: Esophagus is decompressed. No significant lymphadenopathy. Lungs/Pleura: The lungs are clear. No pleural effusions. No pneumothorax. Airways are patent. Upper Abdomen: No acute abnormalities demonstrated in the visualized upper abdomen. Musculoskeletal: Degenerative changes in the thoracic spine. Lower thoracic vertebral kyphoplasty procedure. No destructive bone lesions. Review of the MIP images confirms the above findings. IMPRESSION: 1. No evidence of significant pulmonary embolus. 2. The lungs are clear. Electronically Signed   By: Lucienne Capers M.D.    On: 11/07/2020 04:36   CARDIAC CATHETERIZATION  Result Date: 11/08/2020 Conclusions: 1. Mild to moderate nonobstructive coronary artery disease including sequential 40% ostial and 30% proximal/mid LAD stenoses and sequential 20% proximal and 60% mid LCx stenoses as well as 30% ostial OM1 lesion.  No significant disease noted in the RCA.  Findings are consistent with MI with nonobstructive coronary artery disease (MINOCA). 2. Normal left ventricular systolic function with mildly elevated filling pressure. Recommendations: 1. Continue medical therapy and aggressive secondary prevention of coronary artery disease.  Given history of recurrent falls, I favor deferring dual antiplatelet therapy.  Aspirin 81 mg daily should be continued. Nelva Bush, MD [facility]   DG Humerus Left  Result Date: 11/07/2020 CLINICAL DATA:  Fall EXAM: LEFT HUMERUS - 2+ VIEW COMPARISON:  None. FINDINGS: Chronic fracture of the proximal left humerus with callus formation dorsally. No dislocation or acute fracture. IMPRESSION: No acute fracture or dislocation of the left humerus. Electronically Signed   By: Ulyses Jarred M.D.   On: 11/07/2020 03:40   ECHOCARDIOGRAM COMPLETE  Result Date: 11/07/2020    ECHOCARDIOGRAM REPORT   Patient Name:   DELIGHT BICKLE Date of Exam: 11/07/2020 Medical Rec #:  [id number]           Height:       65.0 in Accession #:  [id number]          Weight:       150.0 lb Date of Birth:  14-Oct-1941          BSA:          1.750 m Patient Age:    68 years            BP:           165/79 mmHg Patient Gender: F                   HR:           58 bpm. Exam Location:  [facility] Procedure: 2D Echo, Color Doppler and Cardiac Doppler Indications:     I21.4 NSTEMI  History:         Patient has no prior history of Echocardiogram examinations.                  CKD; Risk Factors:Hypertension, Dyslipidemia and Diabetes.  Sonographer:     Charmayne Sheer RDCS (AE) Referring Phys:  Baker Janus Soledad Gerlach NIU Diagnosing Phys:  Nelva Bush MD  Sonographer Comments: Suboptimal parasternal window and no subcostal window. IMPRESSIONS  1. Left ventricular ejection fraction, by estimation, is 55 to 60%. The left ventricle has normal function. Left ventricular endocardial border not optimally defined to evaluate regional wall motion. Left ventricular diastolic parameters are consistent with Grade I diastolic dysfunction (impaired relaxation).  2. Right ventricular systolic function is normal. The right ventricular size is normal. Tricuspid regurgitation signal is inadequate for assessing PA pressure.  3. The mitral valve was not well visualized. Trivial mitral valve regurgitation. No evidence of mitral stenosis.  4. The aortic valve was not well visualized. Aortic valve regurgitation is not visualized. No aortic stenosis is present. FINDINGS  Left Ventricle: Left ventricular ejection fraction, by estimation, is 55 to 60%. The left ventricle has normal function. Left ventricular endocardial border not optimally defined to evaluate regional wall motion. The left ventricular internal cavity size was normal in size. There is no left ventricular hypertrophy. Left ventricular diastolic parameters are consistent with Grade I diastolic dysfunction (impaired relaxation). Right Ventricle: The right ventricular size is normal. Right vetricular wall thickness was not well visualized. Right ventricular systolic function is normal. Tricuspid regurgitation signal is inadequate for assessing PA pressure. Left Atrium: Left atrial size was normal in size. Right Atrium: Right atrial size was not well visualized. Pericardium: The pericardium was not well visualized. Mitral Valve: The mitral valve was not well visualized. Mild mitral annular calcification. Trivial mitral valve regurgitation. No evidence of mitral valve stenosis. MV peak gradient, 5.4 mmHg. The mean mitral valve gradient is 2.0 mmHg. Tricuspid Valve: The tricuspid valve is not well visualized.  Tricuspid valve regurgitation is trivial. Aortic Valve: The aortic valve was not well visualized. Aortic valve regurgitation is not visualized. No aortic stenosis is present. Aortic valve mean gradient measures 5.0 mmHg. Aortic valve peak gradient measures 8.4 mmHg. Aortic valve area, by VTI measures 1.74 cm. Pulmonic Valve: The pulmonic valve was not well visualized. Pulmonic valve regurgitation is not visualized. No evidence of pulmonic stenosis. Aorta: The aortic root is normal in size and structure. Pulmonary Artery: The pulmonary artery is not well seen. IAS/Shunts: The interatrial septum was not well visualized.  LEFT VENTRICLE PLAX 2D LVIDd:         3.95 cm  Diastology LVIDs:         2.98 cm  LV e' medial:  5.98 cm/s LV PW:         0.85 cm  LV E/e' medial:  13.3 LV IVS:        0.74 cm  LV e' lateral:   5.77 cm/s LVOT diam:     1.90 cm  LV E/e' lateral: 13.7 LV SV:         57 LV SV Index:   33 LVOT Area:     2.84 cm  RIGHT VENTRICLE RV Basal diam:  3.33 cm LEFT ATRIUM             Index LA diam:        3.50 cm 2.00 cm/m LA Vol (A2C):   41.1 ml 23.48 ml/m LA Vol (A4C):   44.0 ml 25.14 ml/m LA Biplane Vol: 42.9 ml 24.51 ml/m  AORTIC VALVE                    PULMONIC VALVE AV Area (Vmax):    1.70 cm     PV Vmax:       1.03 m/s AV Area (Vmean):   1.59 cm     PV Vmean:      70.300 cm/s AV Area (VTI):     1.74 cm     PV VTI:        0.237 m AV Vmax:           145.00 cm/s  PV Peak grad:  4.2 mmHg AV Vmean:          106.000 cm/s PV Mean grad:  2.0 mmHg AV VTI:            0.330 m AV Peak Grad:      8.4 mmHg AV Mean Grad:      5.0 mmHg LVOT Vmax:         86.80 cm/s LVOT Vmean:        59.400 cm/s LVOT VTI:          0.202 m LVOT/AV VTI ratio: 0.61  AORTA Ao Root diam: 3.00 cm MITRAL VALVE MV Area (PHT): 3.11 cm     SHUNTS MV Peak grad:  5.4 mmHg     Systemic VTI:  0.20 m MV Mean grad:  2.0 mmHg     Systemic Diam: 1.90 cm MV Vmax:       1.16 m/s MV Vmean:      64.2 cm/s MV Decel Time: 244 msec MV E velocity:  79.30 cm/s MV A velocity: 102.00 cm/s MV E/A ratio:  0.78 Harrell Gave End MD Electronically signed by Nelva Bush MD Signature Date/Time: 11/07/2020/1:17:20 PM    Final     Microbiology: Recent Results (from the past 240 hour(s))  Resp Panel by RT-PCR (Flu A&B, Covid) Nasopharyngeal Swab     Status: None   Collection Time: 11/07/20  3:35 AM   Specimen: Nasopharyngeal Swab; Nasopharyngeal(NP) swabs in vial transport medium  Result Value Ref Range Status   SARS Coronavirus 2 by RT PCR NEGATIVE NEGATIVE Final    Comment: (NOTE) SARS-CoV-2 target nucleic acids are NOT DETECTED.  The SARS-CoV-2 RNA is generally detectable in upper respiratory specimens during the acute phase of infection. The lowest concentration of SARS-CoV-2 viral copies this assay can detect is 138 copies/mL. A negative result does not preclude SARS-Cov-2 infection and should not be used as the sole basis for treatment or other patient management decisions. A negative result may occur with  improper specimen collection/handling, submission of specimen other than nasopharyngeal swab, presence of viral  mutation(s) within the areas targeted by this assay, and inadequate number of viral copies(<138 copies/mL). A negative result must be combined with clinical observations, patient history, and epidemiological information. The expected result is Negative.  Fact Sheet for Patients:  EntrepreneurPulse.com.au  Fact Sheet for Healthcare Providers:  IncredibleEmployment.be  This test is no t yet approved or cleared by the [place] FDA and  has been authorized for detection and/or diagnosis of SARS-CoV-2 by FDA under an Emergency Use Authorization (EUA). This EUA will remain  in effect (meaning this test can be used) for the duration of the COVID-19 declaration under Section 564(b)(1) of the Act, 21 U.S.C.section 360bbb-3(b)(1), unless the authorization is terminated  or revoked  sooner.       Influenza A by PCR NEGATIVE NEGATIVE Final   Influenza B by PCR NEGATIVE NEGATIVE Final    Comment: (NOTE) The Xpert Xpress SARS-CoV-2/FLU/RSV plus assay is intended as an aid in the diagnosis of influenza from Nasopharyngeal swab specimens and should not be used as a sole basis for treatment. Nasal washings and aspirates are unacceptable for Xpert Xpress SARS-CoV-2/FLU/RSV testing.  Fact Sheet for Patients: EntrepreneurPulse.com.au  Fact Sheet for Healthcare Providers: IncredibleEmployment.be  This test is not yet approved or cleared by the [place] FDA and has been authorized for detection and/or diagnosis of SARS-CoV-2 by FDA under an Emergency Use Authorization (EUA). This EUA will remain in effect (meaning this test can be used) for the duration of the COVID-19 declaration under Section 564(b)(1) of the Act, 21 U.S.C. section 360bbb-3(b)(1), unless the authorization is terminated or revoked.  Performed at [facility], [address] [place], [place] [address]   MRSA PCR Screening     Status: None   Collection Time: 11/07/20  5:12 PM   Specimen: Nasopharyngeal  Result Value Ref Range Status   MRSA by PCR NEGATIVE NEGATIVE Final    Comment:        The GeneXpert MRSA Assay (FDA approved for NASAL specimens only), is one component of a comprehensive MRSA colonization surveillance program. It is not intended to diagnose MRSA infection nor to guide or monitor treatment for MRSA infections. Performed at [facility], [address] [place], [place] [address]      Labs: Basic Metabolic Panel: Recent Labs  Lab 11/06/20 2356 11/08/20 0454 11/09/20 0704  NA 136 137 136  K 4.0 4.7 4.4  CL 103 105 102  CO2 23 25 25   GLUCOSE 164* 137* 137*  BUN 26* 25* 28*  CREATININE 1.25* 1.12* 1.13*  CALCIUM 9.4 8.9 9.3  MG 1.8  --   --    Liver Function Tests: Recent Labs  Lab 11/06/20 2356  11/09/20 0704  AST 18 15  ALT 11 13  ALKPHOS 94 91  BILITOT 0.7 0.8  PROT 7.7 6.6  ALBUMIN 4.1 3.6   No results for input(s): LIPASE, AMYLASE in the last 168 hours. No results for input(s): AMMONIA in the last 168 hours. CBC: Recent Labs  Lab 11/06/20 2356 11/07/20 1330 11/08/20 0454 11/09/20 0704  WBC 7.1 6.1 5.0 5.2  NEUTROABS  --   --   --  3.2  HGB 11.2* 10.9* 10.2* 10.7*  HCT 34.3* 33.2* 31.1* 31.7*  MCV 95.0 94.6 94.2 92.4  PLT 240 219 217 224   Cardiac Enzymes: No results for input(s): CKTOTAL, CKMB, CKMBINDEX, TROPONINI in the last 168 hours. BNP: BNP (last 3 results) No results for input(s): BNP in the last 8760 hours.  ProBNP (  last 3 results) No results for input(s): PROBNP in the last 8760 hours.  CBG: Recent Labs  Lab 11/08/20 0737 11/08/20 1110 11/08/20 1656 11/08/20 2112 11/09/20 0727  GLUCAP 129* 120* 115* 152* 123*       Signed:  Nita Sells MD   [facility] 11/09/2020, 9:48 AM

## 2020-11-13 NOTE — Progress Notes (Signed)
Cardiology Office Note    Date:  11/14/2020   ID:  Lindsey, Shaw 10-25-1941, MRN [id number]  PCP:  Kirk Ruths, MD  Cardiologist:  Ida Rogue, MD  Electrophysiologist:  None   Chief Complaint: Hospital follow-up  History of Present Illness:   Lindsey Shaw is a 79 y.o. female with history of nonobstructive CAD as outlined below, CKD stage III, DM2, HTN, HLD, remote left lower extremity DVT in the setting of a fall, and GERD who presents for hospital follow-up after recent admission to [facility] from 11/23 through 11/25 for chest pain.  Ms. Gradel was previously evaluated by Dr. Clayborn Bigness for preoperative evaluation for back surgery.  She was noted to have an abnormal EKG at that time.  Recommendation was to proceed with surgery and undergo stress testing thereafter.  Subsequent stress test in 2013 showed no evidence of ischemia.  Following this, she was evaluated by Dr. Rockey Situ in 02/2017 for preoperative evaluation for a right knee replacement.  She was lost to follow-up after this.  More recently, she was seen by orthopedic surgery in 06/2020 following a mechanical fall going up the stairs in which she fell backwards landing on her left shoulder leading to a nondisplaced left humeral fracture which has been followed by orthopedics.  She was seen by an urgent care in late 09/2020 with bilateral lower extremity swelling with ultrasound being negative for DVT with symptoms felt to be most likely related to venous stasis.  Earlier this month she suffered a mechanical fall tripping on her dog landing on her left side injuring the left side of her rib cage and causing a left wrist fracture.  She was admitted to [facility] on 11/23 after developing sharp left-sided chest pain underneath her left breast with associated shortness of breath that woke her up from sleep.  Her pain was exacerbated by positional and rotational movements.  It was also reproducible to palpation.  EKG was  nonacute.  CTA of the chest showed no evidence of PE.  CT head showed no acute intracranial abnormality.  High-sensitivity troponin ultimately peaked at 430 and down trended.  Echo showed an EF of 55 to 40%, grade 1 diastolic dysfunction, normal RV systolic function and ventricular cavity size, and trivial mitral regurgitation.  Initially, plans were for her to undergo a Lexiscan MPI.  However the patient and family preferred to undergo diagnostic cath which was performed on 11/08/2020 and demonstrated mild to moderate nonobstructive CAD including sequential 40% ostial and 30% proximal/mid LAD stenoses and sequential 20% proximal and 60% mid LCx stenoses as well as 30% ostial OM1 stenosis.  There was no significant disease noted in the RCA.  Findings were consistent with MI with nonobstructive coronary artery disease (MINOCA).  Medical management was recommended and DAPT was deferred given her history of recurrent falls.  She comes in accompanied by one of her daughter today and has done well from a cardiac perspective since her hospital discharge.  She has continued to have some mild positional/rotational chest discomfort as well as some fatigue following her hospital admission.  Otherwise that she has done well.  She denies any falls, hematochezia, or melena.  She is tolerating her cardiac medications without issues.  She has follow-up with her PCP later this week.  She follows up with orthopedics tomorrow for her left wrist fracture.  No issues from her cardiac cath site.   Labs independently reviewed: 10/2020 - potassium 4.4, BUN 28, serum creatinine 1.13, BUN 3.6,  AST/ALT normal, Hgb 10.7, PLT 224, TC 143, TG 54, HDL 63, LDL 69, A1c 7.8, magnesium 1.8  Past Medical History:  Diagnosis Date  . Anxiety   . Arthritis   . Cancer ([place]) 2005, 2010   MELANOMA   . Chronic kidney disease    Stage 3 Kidney Disease  . Depression   . Diabetes mellitus without complication ([place])   . DVT (deep venous  thrombosis) ([place]) 2006   from fall  . GERD (gastroesophageal reflux disease)   . Gout   . History of stress test    10+ YRS (Lexiscan: no evid of ischemia/infarct, EF 72% 06/23/12 [facility])  . Hyperlipidemia   . Hypertension   . Mental disorder     Past Surgical History:  Procedure Laterality Date  . APPENDECTOMY  1967  . BACK SURGERY     2007  . CARDIAC CATHETERIZATION    . CATARACT EXTRACTION W/PHACO Left 11/05/2016   Procedure: CATARACT EXTRACTION PHACO AND INTRAOCULAR LENS PLACEMENT (IOC);  Surgeon: Birder Robson, MD;  Location: [facility] ORS;  Service: Ophthalmology;  Laterality: Left;  Lot # [id number] H [place]: 01:16.4 AP%:24.0 CDE: 18.29  . CATARACT EXTRACTION W/PHACO Right 11/26/2016   Procedure: CATARACT EXTRACTION PHACO AND INTRAOCULAR LENS PLACEMENT (IOC);  Surgeon: Birder Robson, MD;  Location: [facility] ORS;  Service: Ophthalmology;  Laterality: Right;  [place] 48.1 AP% 15.4 CDE 7.36 Fluid pack lot # [id number]  . CHOLECYSTECTOMY  1967  . EYE SURGERY    . INGUINAL HERNIA REPAIR Right 04/06/2018   Procedure: LAPAROSCOPIC INGUINAL HERNIA;  Surgeon: Robert Bellow, MD;  Location: [facility] ORS;  Service: General;  Laterality: Right;  . KYPHOPLASTY N/A 11/21/2015   Procedure: KYPHOPLASTY THORACIC 12;  Surgeon: Hessie Knows, MD;  Location: [facility] ORS;  Service: Orthopedics;  Laterality: N/A;  . LEFT HEART CATH AND CORONARY ANGIOGRAPHY N/A 11/08/2020   Procedure: LEFT HEART CATH AND CORONARY ANGIOGRAPHY;  Surgeon: Nelva Bush, MD;  Location: [place] CV LAB;  Service: Cardiovascular;  Laterality: N/A;  . LUMBAR LAMINECTOMY/DECOMPRESSION MICRODISCECTOMY Right 06/28/2013   Procedure: LUMBAR LAMINECTOMY/DECOMPRESSION MICRODISCECTOMY 1 LEVEL;  Surgeon: Elaina Hoops, MD;  Location: [place] [facility] ORS;  Service: Neurosurgery;  Laterality: Right;  Lumbar Laminectomy Decompression Lumbar Four-Five Right  . TOE SURGERY Bilateral    BIL GREAT TOE JOINT REMOVED  . TOTAL KNEE ARTHROPLASTY Right 04/09/2017    . TOTAL KNEE ARTHROPLASTY Right 04/09/2017   Procedure: RIGHT TOTAL KNEE ARTHROPLASTY;  Surgeon: Ninetta Lights, MD;  Location: [place];  Service: Orthopedics;  Laterality: Right;  . TUBAL LIGATION     73    Current Medications: Current Meds  Medication Sig  . acetaminophen (TYLENOL) 500 MG tablet Take 500-1,000 mg by mouth every 6 (six) hours as needed (for pain.).   Marland Kitchen aspirin EC 81 MG tablet Take 81 mg by mouth every evening.  Marland Kitchen atorvastatin (LIPITOR) 40 MG tablet Take 40 mg by mouth every evening.  . citalopram (CELEXA) 20 MG tablet Take 20 mg by mouth every evening.  . diclofenac Sodium (VOLTAREN) 1 % GEL Apply 4 g topically 4 (four) times daily.  . Febuxostat (ULORIC) 80 MG TABS Take 80 mg by mouth every evening.  . ferrous sulfate 325 (65 FE) MG tablet Take 325 mg by mouth daily before supper.  . gabapentin (NEURONTIN) 100 MG capsule Take 100 mg by mouth at bedtime.   Marland Kitchen losartan (COZAAR) 100 MG tablet Take 100 mg by mouth every evening.   . metFORMIN (GLUCOPHAGE-XR) 500 MG 24  hr tablet Take 500 mg by mouth daily before supper.  Marland Kitchen omeprazole (PRILOSEC) 20 MG capsule Take 20 mg by mouth every evening.  . [DISCONTINUED] amLODipine (NORVASC) 5 MG tablet Take 5 mg by mouth daily.    Allergies:   Keflex [cephalexin]   Social History   Socioeconomic History  . Marital status: Widowed    Spouse name: Not on file  . Number of children: Not on file  . Years of education: Not on file  . Highest education level: Not on file  Occupational History  . Not on file  Tobacco Use  . Smoking status: Never Smoker  . Smokeless tobacco: Never Used  Vaping Use  . Vaping Use: Never used  Substance and Sexual Activity  . Alcohol use: No  . Drug use: No  . Sexual activity: Not on file  Other Topics Concern  . Not on file  Social History Narrative  . Not on file   Social Determinants of Health   Financial Resource Strain:   . Difficulty of Paying Living Expenses: Not on file  Food  Insecurity:   . Worried About Charity fundraiser in the Last Year: Not on file  . Ran Out of Food in the Last Year: Not on file  Transportation Needs:   . Lack of Transportation (Medical): Not on file  . Lack of Transportation (Non-Medical): Not on file  Physical Activity:   . Days of Exercise per Week: Not on file  . Minutes of Exercise per Session: Not on file  Stress:   . Feeling of Stress : Not on file  Social Connections:   . Frequency of Communication with Friends and Family: Not on file  . Frequency of Social Gatherings with Friends and Family: Not on file  . Attends Religious Services: Not on file  . Active Member of Clubs or Organizations: Not on file  . Attends Archivist Meetings: Not on file  . Marital Status: Not on file     Family History:  The patient's family history includes AAA (abdominal aortic aneurysm) in her paternal aunt; Breast cancer in her cousin, maternal aunt, and maternal uncle; Lung cancer in her father; Ovarian cancer in her mother; Pancreatic cancer in her mother.  ROS:   Review of Systems  Constitutional: Positive for malaise/fatigue. Negative for chills, diaphoresis, fever and weight loss.  HENT: Negative for congestion.   Eyes: Negative for discharge and redness.  Respiratory: Negative for cough, sputum production, shortness of breath and wheezing.   Cardiovascular: Positive for chest pain. Negative for palpitations, orthopnea, claudication, leg swelling and PND.  Gastrointestinal: Negative for abdominal pain, blood in stool, heartburn, melena, nausea and vomiting.  Musculoskeletal: Negative for falls and myalgias.  Skin: Negative for rash.  Neurological: Positive for weakness. Negative for dizziness, tingling, tremors, sensory change, speech change, focal weakness and loss of consciousness.  Endo/Heme/Allergies: Does not bruise/bleed easily.  Psychiatric/Behavioral: Negative for substance abuse. The patient is not nervous/anxious.     All other systems reviewed and are negative.    EKGs/Labs/Other Studies Reviewed:    Studies reviewed were summarized above. The additional studies were reviewed today:  2D echo 11/07/2020: 1. Left ventricular ejection fraction, by estimation, is 55 to 60%. The  left ventricle has normal function. Left ventricular endocardial border  not optimally defined to evaluate regional wall motion. Left ventricular  diastolic parameters are consistent  with Grade I diastolic dysfunction (impaired relaxation).  2. Right ventricular systolic function is normal. The  right ventricular  size is normal. Tricuspid regurgitation signal is inadequate for assessing  PA pressure.  3. The mitral valve was not well visualized. Trivial mitral valve  regurgitation. No evidence of mitral stenosis.  4. The aortic valve was not well visualized. Aortic valve regurgitation  is not visualized. No aortic stenosis is present. ___________  [facility] 11/08/2020: Conclusions: 1. Mild to moderate nonobstructive coronary artery disease including sequential 40% ostial and 30% proximal/mid LAD stenoses and sequential 20% proximal and 60% mid LCx stenoses as well as 30% ostial OM1 lesion.  No significant disease noted in the RCA.  Findings are consistent with MI with nonobstructive coronary artery disease (MINOCA). 2. Normal left ventricular systolic function with mildly elevated filling pressure.  Recommendations: 1. Continue medical therapy and aggressive secondary prevention of coronary artery disease.  Given history of recurrent falls, I favor deferring dual antiplatelet therapy.  Aspirin 81 mg daily should be continued.    EKG:  EKG is ordered today.  The EKG ordered today demonstrates sinus bradycardia, 56 bpm, left axis deviation, no acute ST-T changes  Recent Labs: 11/06/2020: Magnesium 1.8 11/09/2020: ALT 13; BUN 28; Creatinine, Ser 1.13; Hemoglobin 10.7; Platelets 224; Potassium 4.4; Sodium 136  Recent Lipid  Panel    Component Value Date/Time   CHOL 143 11/08/2020 0454   TRIG 54 11/08/2020 0454   HDL 63 11/08/2020 0454   CHOLHDL 2.3 11/08/2020 0454   VLDL 11 11/08/2020 0454   LDLCALC 69 11/08/2020 0454    PHYSICAL EXAM:    VS:  BP (!) 150/80 (BP Location: Left Arm, Patient Position: Sitting, Cuff Size: Normal)   Pulse 89   Ht 5\' 5"  (1.651 m)   Wt 151 lb (68.5 kg)   SpO2 98%   BMI 25.13 kg/m   BMI: Body mass index is 25.13 kg/m.  Physical Exam Vitals reviewed.  Constitutional:      Appearance: She is well-developed.  HENT:     Head: Normocephalic and atraumatic.  Eyes:     General:        Right eye: No discharge.        Left eye: No discharge.  Neck:     Vascular: No JVD.  Cardiovascular:     Rate and Rhythm: Normal rate and regular rhythm.     Pulses: No midsystolic click and no opening snap.          Radial pulses are 2+ on the right side.       Posterior tibial pulses are 2+ on the right side and 2+ on the left side.     Heart sounds: Normal heart sounds, S1 normal and S2 normal. Heart sounds not distant. No murmur heard.  No friction rub.     Comments: Right radial cardiac cath site is well-healing without active bleeding, swelling, warmth, erythema, or tenderness to palpation.  Radial pulse 2+.   Pulmonary:     Effort: Pulmonary effort is normal. No respiratory distress.     Breath sounds: Normal breath sounds. No decreased breath sounds, wheezing or rales.  Chest:     Chest wall: No tenderness.  Abdominal:     General: There is no distension.     Palpations: Abdomen is soft.     Tenderness: There is no abdominal tenderness.  Musculoskeletal:     Cervical back: Normal range of motion.     Comments: Brace noted along the left wrist.  Skin:    General: Skin is warm and dry.  Nails: There is no clubbing.  Neurological:     Mental Status: She is alert and oriented to person, place, and time.  Psychiatric:        Speech: Speech normal.        Behavior:  Behavior normal.        Thought Content: Thought content normal.        Judgment: Judgment normal.     Wt Readings from Last 3 Encounters:  11/14/20 151 lb (68.5 kg)  11/09/20 155 lb 6.4 oz (70.5 kg)  10/07/20 155 lb (70.3 kg)     ASSESSMENT & PLAN:   1. Nonobstructive CAD: She is doing well without any symptoms concerning for angina.  She has not been placed on DAPT secondary to recurrent and recent falls.  She will continue secondary prevention with aspirin, amlodipine, atorvastatin, and losartan.  Not on a beta-blocker secondary to bradycardic heart rates.  Post-cath instructions.  Check CBC.  2. HTN: Blood pressure is mildly elevated in the office today with a reading of 150/80 at triage and a recheck at the end of her visit of 148/78.  Titrate amlodipine to 10 mg daily.  Otherwise she will continue losartan 100 mg daily.  3. HLD: LDL 69 from 10/2020 with normal LFT at that time.  She remains on atorvastatin.  4. CKD stage III: Stable at time of discharge.  Check BMP.  5. Remote left lower extremity DVT: No longer requiring anticoagulation.  6. Recurrent falls: Consider PT/OT referral.  This is deferred to patient's PCP.  Disposition: F/u with Dr. Rockey Situ or an APP in 3 months.   Medication Adjustments/Labs and Tests Ordered: Current medicines are reviewed at length with the patient today.  Concerns regarding medicines are outlined above. Medication changes, Labs and Tests ordered today are summarized above and listed in the Patient Instructions accessible in Encounters.   Signed, Christell Faith, PA-C 11/14/2020 8:37 AM     [place] [address] [place] [address] [place], [place] [address] [phone]

## 2020-11-14 ENCOUNTER — Other Ambulatory Visit: Payer: Self-pay

## 2020-11-14 ENCOUNTER — Encounter: Payer: Self-pay | Admitting: Physician Assistant

## 2020-11-14 ENCOUNTER — Ambulatory Visit (INDEPENDENT_AMBULATORY_CARE_PROVIDER_SITE_OTHER): Payer: Medicare Other | Admitting: Physician Assistant

## 2020-11-14 VITALS — BP 150/80 | HR 56 | Ht 65.0 in | Wt 151.0 lb

## 2020-11-14 DIAGNOSIS — I10 Essential (primary) hypertension: Secondary | ICD-10-CM | POA: Diagnosis not present

## 2020-11-14 DIAGNOSIS — R296 Repeated falls: Secondary | ICD-10-CM

## 2020-11-14 DIAGNOSIS — N183 Chronic kidney disease, stage 3 unspecified: Secondary | ICD-10-CM

## 2020-11-14 DIAGNOSIS — Z86718 Personal history of other venous thrombosis and embolism: Secondary | ICD-10-CM

## 2020-11-14 DIAGNOSIS — I2510 Atherosclerotic heart disease of native coronary artery without angina pectoris: Secondary | ICD-10-CM

## 2020-11-14 DIAGNOSIS — E785 Hyperlipidemia, unspecified: Secondary | ICD-10-CM

## 2020-11-14 MED ORDER — AMLODIPINE BESYLATE 10 MG PO TABS: 10.0000 mg | ORAL_TABLET | Freq: Every day | ORAL | 3 refills | Status: AC

## 2020-11-14 NOTE — Patient Instructions (Signed)
Medication Instructions:  Your physician has recommended you make the following change in your medication:   1.  INCREASE your Amlodipine to 10 MG: take one tab by mouth daily.  *If you need a refill on your cardiac medications before your next appointment, please call your pharmacy*   Lab Work:  BMP and CBC drawn today.  If you have labs (blood work) drawn today and your tests are completely normal, you will receive your results only by: Marland Kitchen MyChart Message (if you have MyChart) OR . A paper copy in the mail If you have any lab test that is abnormal or we need to change your treatment, we will call you to review the results.   Testing/Procedures: None Ordered   Follow-Up: At [facility], you and your health needs are our priority.  As part of our continuing mission to provide you with exceptional heart care, we have created designated Provider Care Teams.  These Care Teams include your primary Cardiologist (physician) and Advanced Practice Providers (APPs -  Physician Assistants and Nurse Practitioners) who all work together to provide you with the care you need, when you need it.  We recommend signing up for the patient portal called "MyChart".  Sign up information is provided on this After Visit Summary.  MyChart is used to connect with patients for Virtual Visits (Telemedicine).  Patients are able to view lab/test results, encounter notes, upcoming appointments, etc.  Non-urgent messages can be sent to your provider as well.   To learn more about what you can do with MyChart, go to [email or web address].    Your next appointment:   3 month(s)  The format for your next appointment:   In Person  Provider:   You may see Ida Rogue, MD or one of the following Advanced Practice Providers on your designated Care Team:    Murray Hodgkins, NP  Christell Faith, PA-C  Marrianne Mood, PA-C  Cadence Cheriton, [place]  Laurann Montana, NP    Other Instructions

## 2020-11-15 LAB — BASIC METABOLIC PANEL
BUN/Creatinine Ratio: 18 (ref 12–28)
BUN: 24 mg/dL (ref 8–27)
CO2: 23 mmol/L (ref 20–29)
Calcium: 10.1 mg/dL (ref 8.7–10.3)
Chloride: 102 mmol/L (ref 96–106)
Creatinine, Ser: 1.34 mg/dL — ABNORMAL HIGH (ref 0.57–1.00)
GFR calc Af Amer: 44 mL/min/{1.73_m2} — ABNORMAL LOW (ref >59)
GFR calc non Af Amer: 38 mL/min/{1.73_m2} — ABNORMAL LOW (ref >59)
Glucose: 140 mg/dL — ABNORMAL HIGH (ref 65–99)
Potassium: 5.1 mmol/L (ref 3.5–5.2)
Sodium: 137 mmol/L (ref 134–144)

## 2020-11-15 LAB — CBC
Hematocrit: 33.8 % — ABNORMAL LOW (ref 34.0–46.6)
Hemoglobin: 11.5 g/dL (ref 11.1–15.9)
MCH: 31.1 pg (ref 26.6–33.0)
MCHC: 34 g/dL (ref 31.5–35.7)
MCV: 91 fL (ref 79–97)
Platelets: 306 10*3/uL (ref 150–450)
RBC: 3.7 x10E6/uL — ABNORMAL LOW (ref 3.77–5.28)
RDW: 12.4 % (ref 11.7–15.4)
WBC: 5.4 10*3/uL (ref 3.4–10.8)

## 2020-11-16 ENCOUNTER — Telehealth: Payer: Self-pay

## 2020-11-16 NOTE — Telephone Encounter (Signed)
Able to reach pt regarding her recent lab work, provider Christell Faith, PA-C had a chance to review her results and advised   "Renal function is mildly elevated, though consistent with her prior baseline. Follow up with PCPas directed for her CKD.  Random glucose is mildly elevated with known diabetes.  Potassium is high normal. Please ensure she is not using salt substitutes that are high in potassium.  Blood count is normal. No changes."  Pt verbalized understanding, states she will keep a watch on blood sugars and potassium intake, she will keep in touch with her PCP regarding her labs with her known CKD. Otherwise. all questions and concerns were address and no additional concerns at this time. Agreeable to plan, will call back for anything further.

## 2020-11-16 NOTE — Telephone Encounter (Signed)
-----   Message from Rise Mu, PA-C sent at 11/16/2020  8:48 AM EST ----- Renal function is mildly elevated, though consistent with her prior baseline. Follow up with PCPas directed for her CKD.  Random glucose is mildly elevated with known diabetes.  Potassium is high normal. Please ensure she is not using salt substitutes that are high in potassium.  Blood count is normal. No changes.

## 2020-11-16 NOTE — Telephone Encounter (Signed)
LVM to schedule hosp fu

## 2020-12-16 DEATH — deceased

## 2021-01-15 ENCOUNTER — Other Ambulatory Visit: Payer: Self-pay | Admitting: Internal Medicine

## 2021-01-15 DIAGNOSIS — Z1231 Encounter for screening mammogram for malignant neoplasm of breast: Secondary | ICD-10-CM

## 2021-02-11 NOTE — Progress Notes (Signed)
Cardiology Office Note  Date:  02/12/2021   ID:  Lindsey Shaw, Lindsey Shaw 02-28-1941, MRN [id number]  PCP:  Lindsey Ruths, MD   Chief Complaint  Patient presents with  . Follow-up    3 Months follow up. Medications verbally reviewed with patient.     HPI:  Lindsey Shaw is a very pleasant 80 year old woman with history of  CAD diabetes,  hyperlipidemia,  Depression, Chronic kidney disease, back pain November 2016 requiring vertebroplasty,  abnormal EKG,  presenting for f/u of her chest pain and CAD, cath 11/21  LOV with myself 02/2017 Seen by one of our providers 10/2020  admission to [facility] from 11/23 through 11/09/20 for chest pain. Records reviewed CTA of the chest showed no evidence of PE  Echo showed an EF of 55 to 37%, grade 1 diastolic dysfunction, normal RV systolic function and ventricular cavity size, and trivial mitral regurgitation.    cath which was performed on 11/08/2020 and demonstrated mild to moderate nonobstructive CAD including sequential 40% ostial and 30% proximal/mid LAD stenoses and sequential 20% proximal and 60% mid LCx stenoses as well as 30% ostial OM1 stenosis.  There was no significant disease noted in the RCA.  Findings were consistent with MI with nonobstructive coronary artery disease   nonsmoker Labs reviewed HGBA1C 7.0  Lab Results  Component Value Date   CHOL 143 11/08/2020   HDL 63 11/08/2020   LDLCALC 69 11/08/2020   TRIG 54 11/08/2020    Hip and knee hurts Sedentary Stress, grandaughter with medical issues, back problem, lives with her  Other past medical history reviewed Previously seen by Dr. Clayborn Bigness Several years agofor preoperative evaluation prior to back surgery. She was cleared for surgery the recommendation was made to have stress test  following the surgery based on abnormal EKG  surgery went uneventfully. Stress test 2013: normal  Reports having severe chest pain back in 2007, stress test at that time, was  told this was normal  Previous episodes of sweating    PMH:   has a past medical history of Anxiety; Arthritis; Blood clotting tendency ([place]); Cancer ([place]); Chronic kidney disease; Depression; Diabetes mellitus without complication ([place]); 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[place]) (2006); GERD (gastroesophageal reflux disease); Gout; Hyperlipidemia; Hypertension;   PSH:    Past Surgical History:  Procedure Laterality Date  . APPENDECTOMY  1967  . BACK SURGERY     2007  . CARDIAC CATHETERIZATION    . CATARACT EXTRACTION W/PHACO Left 11/05/2016   Procedure: CATARACT EXTRACTION PHACO AND INTRAOCULAR LENS PLACEMENT (IOC);  Surgeon: Birder Robson, MD;  Location: [facility];  Service: Ophthalmology;  Laterality: Left;  Lot # [id number] H [place]: 01:16.4 AP%:24.0 CDE: 18.29  . CATARACT EXTRACTION W/PHACO Right 11/26/2016   Procedure: CATARACT EXTRACTION PHACO AND INTRAOCULAR LENS PLACEMENT (IOC);  Surgeon: Birder Robson, MD;  Location: [facility];  Service: Ophthalmology;  Laterality: Right;  [place] 48.1 AP% 15.4 CDE 7.36 Fluid pack lot # [id number]  . CHOLECYSTECTOMY  1967  . EYE SURGERY    . INGUINAL HERNIA REPAIR Right 04/06/2018   Procedure: LAPAROSCOPIC INGUINAL HERNIA;  Surgeon: Robert Bellow, MD;  Location: [facility];  Service: General;  Laterality: Right;  . KYPHOPLASTY N/A 11/21/2015   Procedure: KYPHOPLASTY THORACIC 12;  Surgeon: Hessie Knows, MD;  Location: [facility];  Service: Orthopedics;  Laterality: N/A;  . LEFT HEART CATH AND CORONARY ANGIOGRAPHY N/A 11/08/2020   Procedure: LEFT HEART CATH AND CORONARY ANGIOGRAPHY;  Surgeon: Nelva Bush, MD;  Location: [facility];  Service: Cardiovascular;  Laterality: N/A;  . LUMBAR LAMINECTOMY/DECOMPRESSION MICRODISCECTOMY Right 06/28/2013   Procedure: LUMBAR LAMINECTOMY/DECOMPRESSION MICRODISCECTOMY 1 LEVEL;  Surgeon: Elaina Hoops, MD;  Location: [facility] [facility];  Service: Neurosurgery;  Laterality: Right;  Lumbar Laminectomy Decompression Lumbar  Four-Five Right  . TOE SURGERY Bilateral    BIL GREAT TOE JOINT REMOVED  . TOTAL KNEE ARTHROPLASTY Right 04/09/2017  . TOTAL KNEE ARTHROPLASTY Right 04/09/2017   Procedure: RIGHT TOTAL KNEE ARTHROPLASTY;  Surgeon: Ninetta Lights, MD;  Location: [facility];  Service: Orthopedics;  Laterality: Right;  . TUBAL LIGATION     71    Current Outpatient Medications  Medication Sig Dispense Refill  . acetaminophen (TYLENOL) 500 MG tablet Take 500-1,000 mg by mouth every 6 (six) hours as needed (for pain.).     Marland Kitchen amLODipine (NORVASC) 10 MG tablet Take 1 tablet (10 mg total) by mouth daily. 180 tablet 3  . aspirin EC 81 MG tablet Take 81 mg by mouth every evening.    Marland Kitchen atorvastatin (LIPITOR) 40 MG tablet Take 40 mg by mouth every evening.    . citalopram (CELEXA) 20 MG tablet Take 20 mg by mouth every evening.    . diclofenac Sodium (VOLTAREN) 1 % GEL Apply 4 g topically 4 (four) times daily.    . Febuxostat 80 MG TABS Take 80 mg by mouth every evening.    . ferrous sulfate 325 (65 FE) MG tablet Take 325 mg by mouth daily before supper.    . gabapentin (NEURONTIN) 100 MG capsule Take 100 mg by mouth at bedtime.     Marland Kitchen losartan (COZAAR) 100 MG tablet Take 100 mg by mouth every evening.     . metFORMIN (GLUCOPHAGE-XR) 500 MG 24 hr tablet Take 500 mg by mouth daily before supper.    Marland Kitchen omeprazole (PRILOSEC) 20 MG capsule Take 20 mg by mouth every evening.     No current facility-administered medications for this visit.   Facility-Administered Medications Ordered in Other Visits  Medication Dose Route Frequency Provider Last Rate Last Admin  . fentaNYL (SUBLIMAZE) injection 25 mcg  25 mcg Intravenous Q5 min PRN Alvin Critchley, MD   25 mcg at 04/06/18 1227  . ondansetron (ZOFRAN) injection 4 mg  4 mg Intravenous Once PRN Alvin Critchley, MD         Allergies:   Keflex [cephalexin]   Social History:  The patient  reports that she has never smoked. She has never used smokeless tobacco. She reports that she  does not drink alcohol and does not use drugs.   Family History:   family history includes AAA (abdominal aortic aneurysm) in her paternal aunt; Breast cancer in her cousin, maternal aunt, and maternal uncle; Lung cancer in her father; Ovarian cancer in her mother; Pancreatic cancer in her mother.    Review of Systems: Review of Systems  Constitutional: Negative.   Respiratory: Negative.   Cardiovascular: Negative.   Gastrointestinal: Negative.   Musculoskeletal: Positive for joint pain.  Neurological: Negative.   Psychiatric/Behavioral: Negative.   All other systems reviewed and are negative.    PHYSICAL EXAM: VS:  BP 124/70 (BP Location: Left Arm, Patient Position: Sitting, Cuff Size: Normal)   Pulse 72   Ht 5\' 5"  (1.651 m)   Wt 154 lb (69.9 kg)   SpO2 98%   BMI 25.63 kg/m  , BMI Body mass index is 25.63 kg/m. Constitutional:  oriented to person, place, and time. No distress.  HENT:  Head:  Grossly normal Eyes:  no discharge. No scleral icterus.  Neck: No JVD, no carotid bruits  Cardiovascular: Regular rate and rhythm, no murmurs appreciated Pulmonary/Chest: Clear to auscultation bilaterally, no wheezes or rails Abdominal: Soft.  no distension.  no tenderness.  Musculoskeletal: Normal range of motion Neurological:  normal muscle tone. Coordination normal. No atrophy Skin: Skin warm and dry Psychiatric: normal affect, pleasant   Recent Labs: 11/06/2020: Magnesium 1.8 11/09/2020: ALT 13 11/14/2020: BUN 24; Creatinine, Ser 1.34; Hemoglobin 11.5; Platelets 306; Potassium 5.1; Sodium 137    Lipid Panel Lab Results  Component Value Date   CHOL 143 11/08/2020   HDL 63 11/08/2020   LDLCALC 69 11/08/2020   TRIG 54 11/08/2020      Wt Readings from Last 3 Encounters:  02/12/21 154 lb (69.9 kg)  11/14/20 151 lb (68.5 kg)  11/09/20 155 lb 6.4 oz (70.5 kg)       ASSESSMENT AND PLAN:  Mixed hyperlipidemia Cholesterol is at goal on the current lipid regimen. No  changes to the medications were made.  Essential hypertension - Plan: EKG 12-Lead Blood pressure is well controlled on today's visit. No changes made to the medications.  Uncontrolled type 2 diabetes mellitus without complication, without long-term current use of insulin ([place]) -  We have encouraged continued exercise, careful diet management in an effort to lose weight. A1c averaging around 7 Strong recommendation for walking program, notes that she is limited by left knee pain  Anxiety Lots of stress last year, 2021 Continues to have stress this year, granddaughter with health issues   Total encounter time more than 25 minutes  Greater than 50% was spent in counseling and coordination of care with the patient    No orders of the defined types were placed in this encounter.    Signed, Esmond Plants, M.D., Ph.D. 02/12/2021  [place], [place]

## 2021-02-12 ENCOUNTER — Encounter: Payer: Self-pay | Admitting: Cardiovascular Disease

## 2021-02-12 ENCOUNTER — Ambulatory Visit (INDEPENDENT_AMBULATORY_CARE_PROVIDER_SITE_OTHER): Payer: Medicare Other | Admitting: Cardiovascular Disease

## 2021-02-12 ENCOUNTER — Other Ambulatory Visit: Payer: Self-pay

## 2021-02-12 VITALS — BP 124/70 | HR 72 | Ht 65.0 in | Wt 154.0 lb

## 2021-02-12 DIAGNOSIS — I10 Essential (primary) hypertension: Secondary | ICD-10-CM | POA: Diagnosis not present

## 2021-02-12 DIAGNOSIS — E785 Hyperlipidemia, unspecified: Secondary | ICD-10-CM | POA: Diagnosis not present

## 2021-02-12 DIAGNOSIS — I25118 Atherosclerotic heart disease of native coronary artery with other forms of angina pectoris: Secondary | ICD-10-CM

## 2021-02-12 DIAGNOSIS — N183 Chronic kidney disease, stage 3 unspecified: Secondary | ICD-10-CM

## 2021-02-12 DIAGNOSIS — R296 Repeated falls: Secondary | ICD-10-CM

## 2021-02-12 NOTE — Patient Instructions (Signed)
Medication Instructions:  No changes  If you need a refill on your cardiac medications before your next appointment, please call your pharmacy.    Lab work: No new labs needed   If you have labs (blood work) drawn today and your tests are completely normal, you will receive your results only by: . MyChart Message (if you have MyChart) OR . A paper copy in the mail If you have any lab test that is abnormal or we need to change your treatment, we will call you to review the results.   Testing/Procedures: No new testing needed   Follow-Up: At [facility], you and your health needs are our priority.  As part of our continuing mission to provide you with exceptional heart care, we have created designated Provider Care Teams.  These Care Teams include your primary Cardiologist (physician) and Advanced Practice Providers (APPs -  Physician Assistants and Nurse Practitioners) who all work together to provide you with the care you need, when you need it.  . You will need a follow up appointment in 12 months  . Providers on your designated Care Team:   . Christopher Berge, NP . Ryan Dunn, PA-C . Jacquelyn Visser, PA-C  Any Other Special Instructions Will Be Listed Below (If Applicable).  COVID-19 Vaccine Information can be found at: [email or web address] For questions related to vaccine distribution or appointments, please email [email or web address] or call [phone].     

## 2021-02-26 ENCOUNTER — Ambulatory Visit
Admission: RE | Admit: 2021-02-26 | Discharge: 2021-02-26 | Disposition: A | Discharge status: Home / Self Care | Payer: Medicare Other | Source: Ambulatory Visit | Attending: Internal Medicine | Admitting: Internal Medicine

## 2021-02-26 ENCOUNTER — Other Ambulatory Visit: Payer: Self-pay

## 2021-02-26 DIAGNOSIS — Z1231 Encounter for screening mammogram for malignant neoplasm of breast: Secondary | ICD-10-CM | POA: Insufficient documentation

## 2021-05-03 ENCOUNTER — Other Ambulatory Visit: Payer: Self-pay | Admitting: Family Medicine

## 2021-05-03 ENCOUNTER — Ambulatory Visit
Admission: RE | Admit: 2021-05-03 | Discharge: 2021-05-03 | Disposition: A | Discharge status: Home / Self Care | Payer: Medicare Other | Source: Ambulatory Visit | Attending: Family Medicine | Admitting: Family Medicine

## 2021-05-03 ENCOUNTER — Other Ambulatory Visit (HOSPITAL_COMMUNITY): Payer: Self-pay | Admitting: Family Medicine

## 2021-05-03 ENCOUNTER — Other Ambulatory Visit: Payer: Self-pay

## 2021-05-03 DIAGNOSIS — M79605 Pain in left leg: Secondary | ICD-10-CM | POA: Insufficient documentation

## 2021-07-03 ENCOUNTER — Other Ambulatory Visit: Payer: Self-pay | Admitting: Physical Medicine and Rehabilitation

## 2021-07-03 ENCOUNTER — Other Ambulatory Visit: Payer: Self-pay | Admitting: Family Medicine

## 2021-07-03 ENCOUNTER — Other Ambulatory Visit: Payer: Self-pay

## 2021-07-03 ENCOUNTER — Ambulatory Visit
Admission: RE | Admit: 2021-07-03 | Discharge: 2021-07-03 | Disposition: A | Discharge status: Home / Self Care | Payer: Medicare Other | Source: Ambulatory Visit | Attending: Family Medicine | Admitting: Family Medicine

## 2021-07-03 DIAGNOSIS — M79662 Pain in left lower leg: Secondary | ICD-10-CM

## 2021-07-03 DIAGNOSIS — Z86718 Personal history of other venous thrombosis and embolism: Secondary | ICD-10-CM

## 2021-11-29 IMAGING — MG MM DIGITAL SCREENING BILAT W/ TOMO AND CAD
8 series · 9 of 24 positions shown · non-contrast
Comparison: Previous exam(s).

CLINICAL DATA: Screening.

EXAM:
DIGITAL SCREENING BILATERAL MAMMOGRAM WITH TOMOSYNTHESIS AND CAD
TECHNIQUE: Bilateral screening digital craniocaudal and mediolateral oblique
mammograms were obtained. Bilateral screening digital breast
tomosynthesis was performed. The images were evaluated with
computer-aided detection.

[R MLO synth-2D]
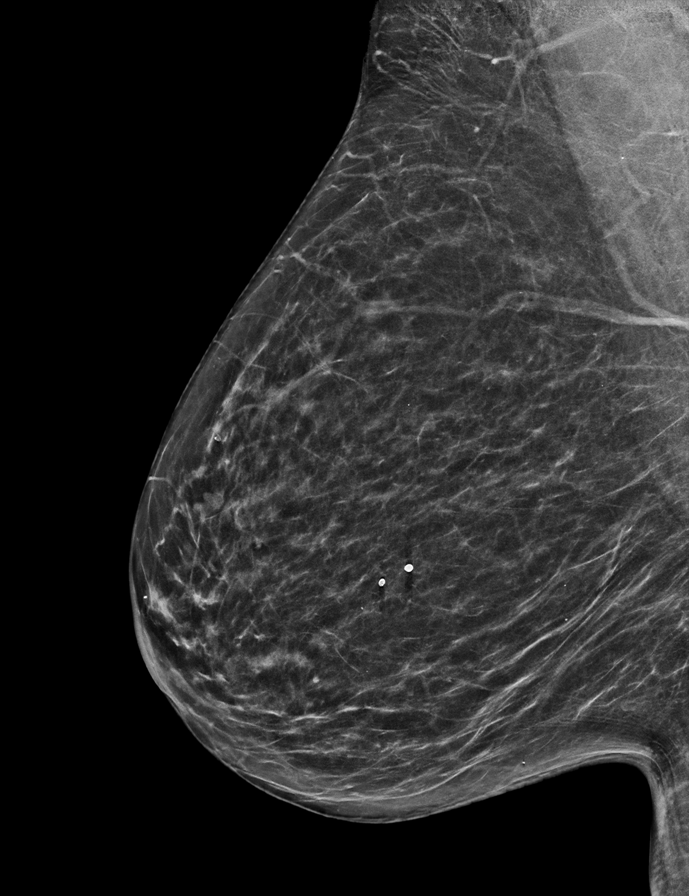

[L MLO synth-2D]
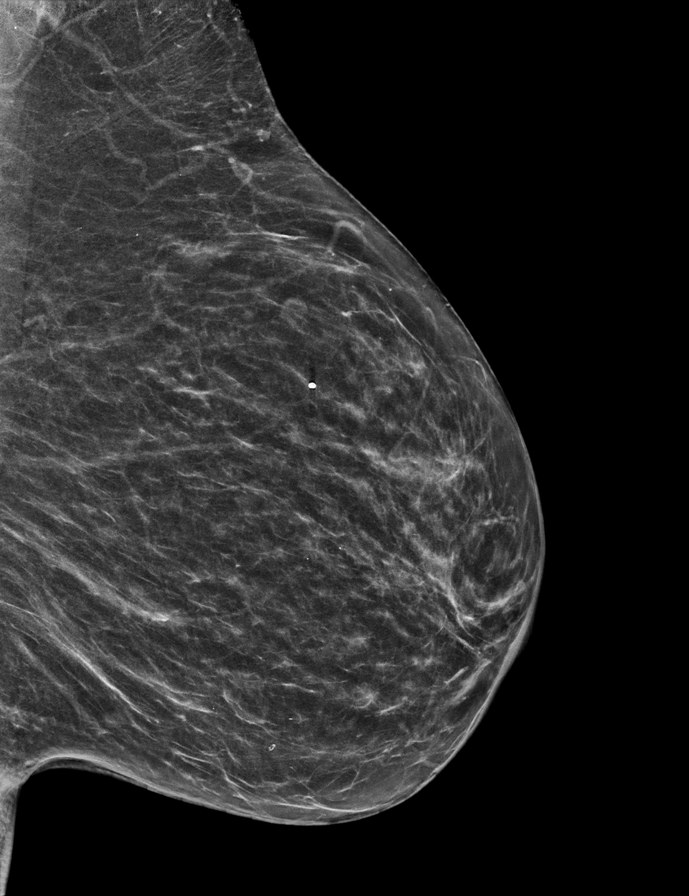

[L CC synth-2D]
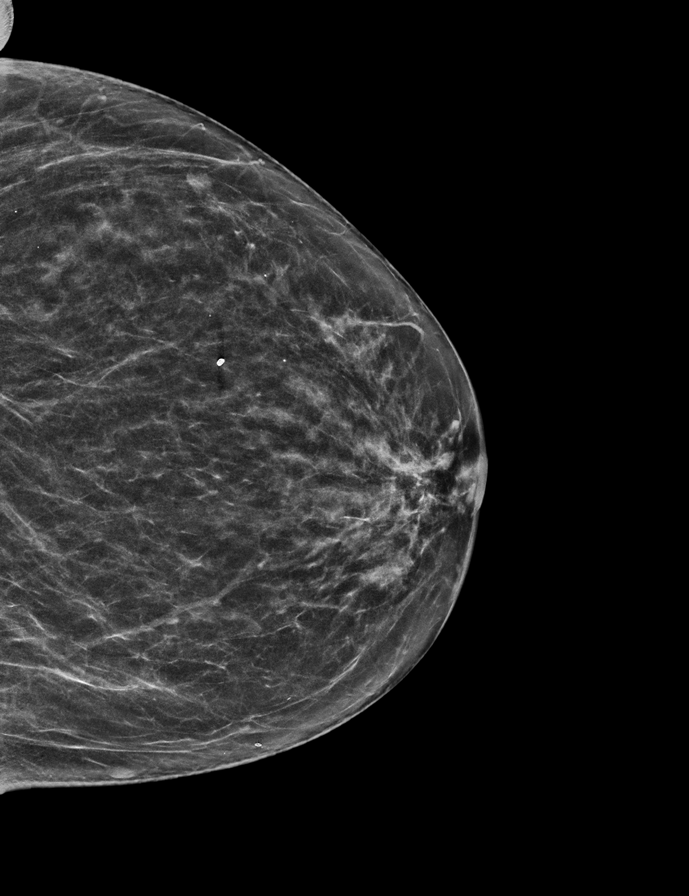

[R CC synth-2D]
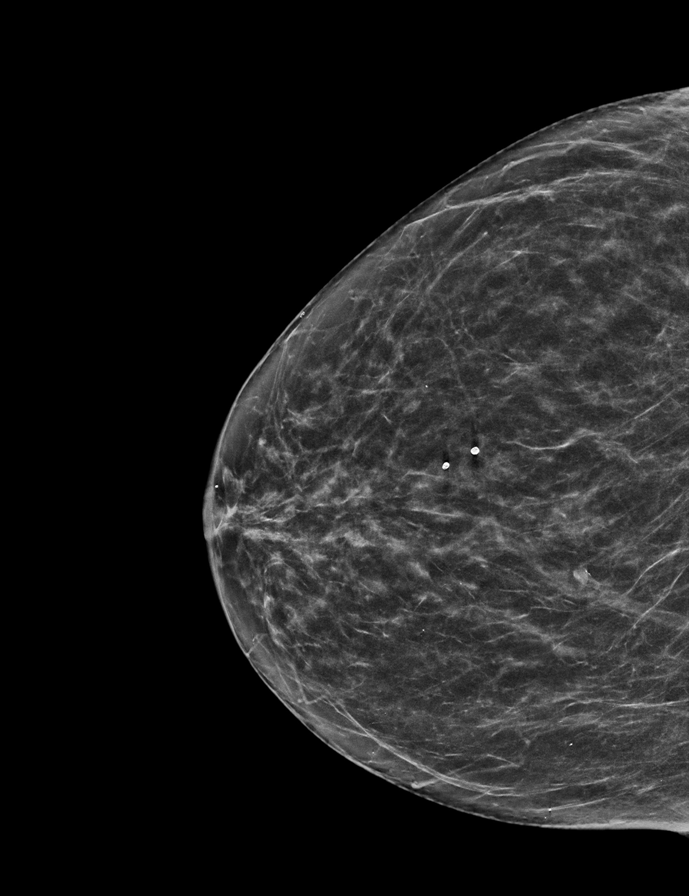

[L MLO tomo · 2 of 57 frames shown]
[frame 19/57]
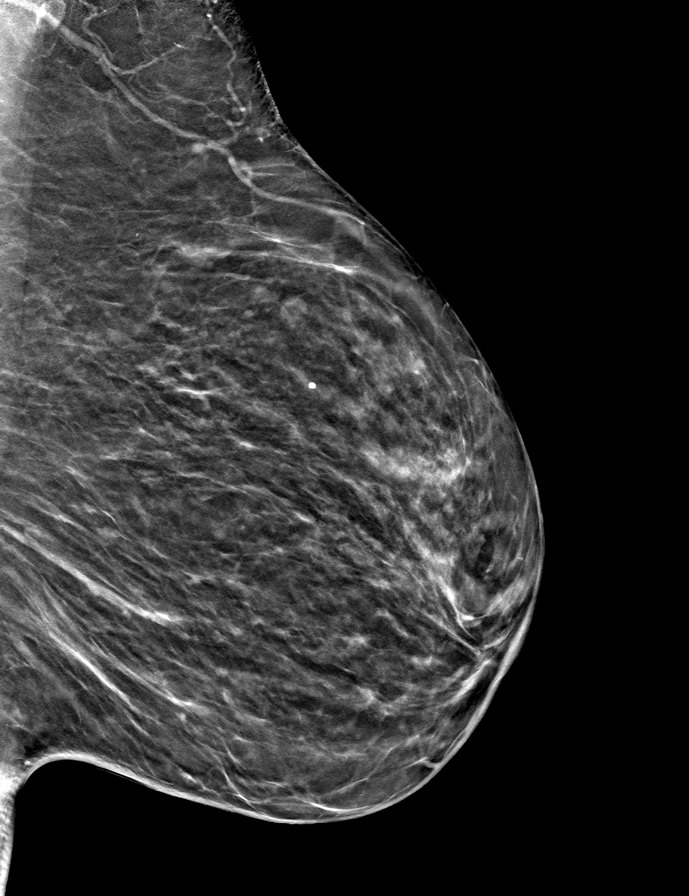
[frame 29/57]
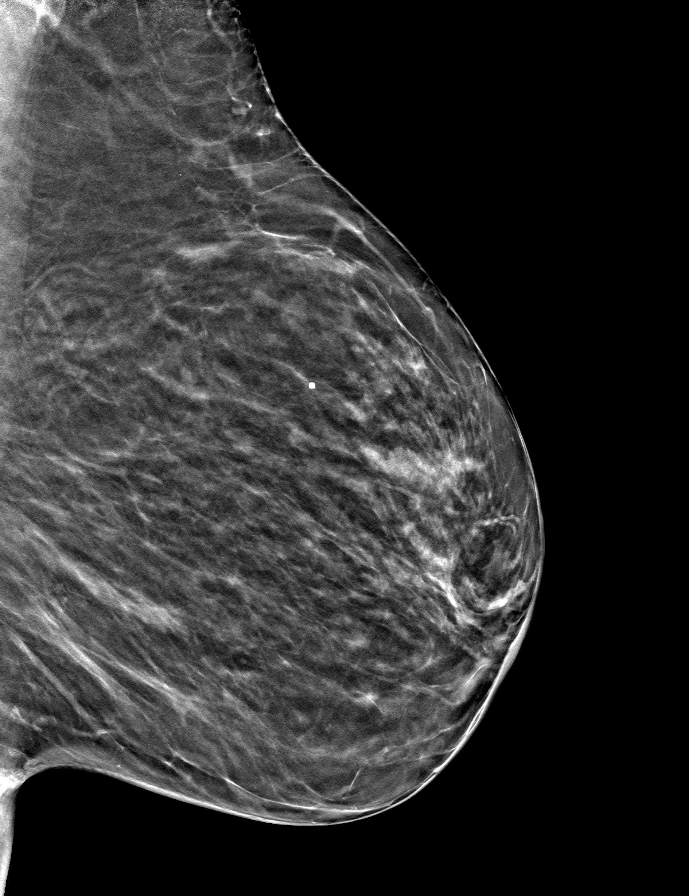

[L CC tomo · tomo slice 27/52.0]
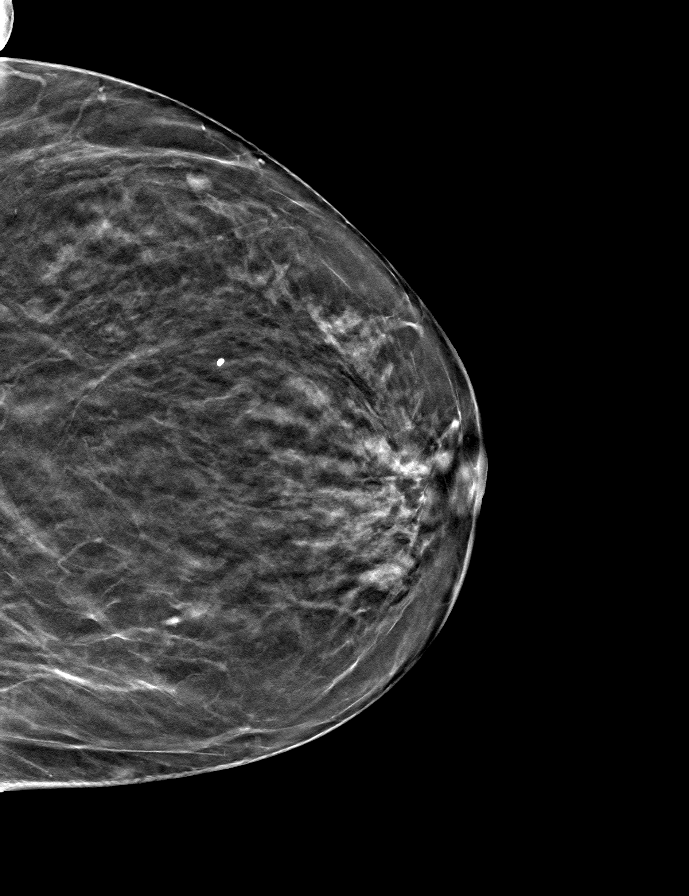

[R CC tomo · tomo slice 29/57.0]
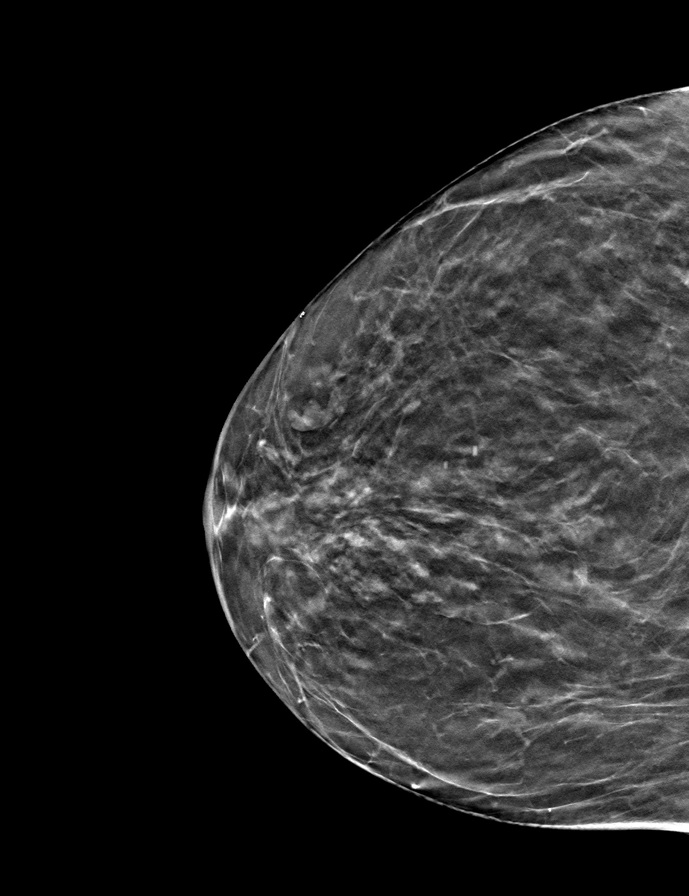

[R MLO tomo · tomo slice 31/60.0]
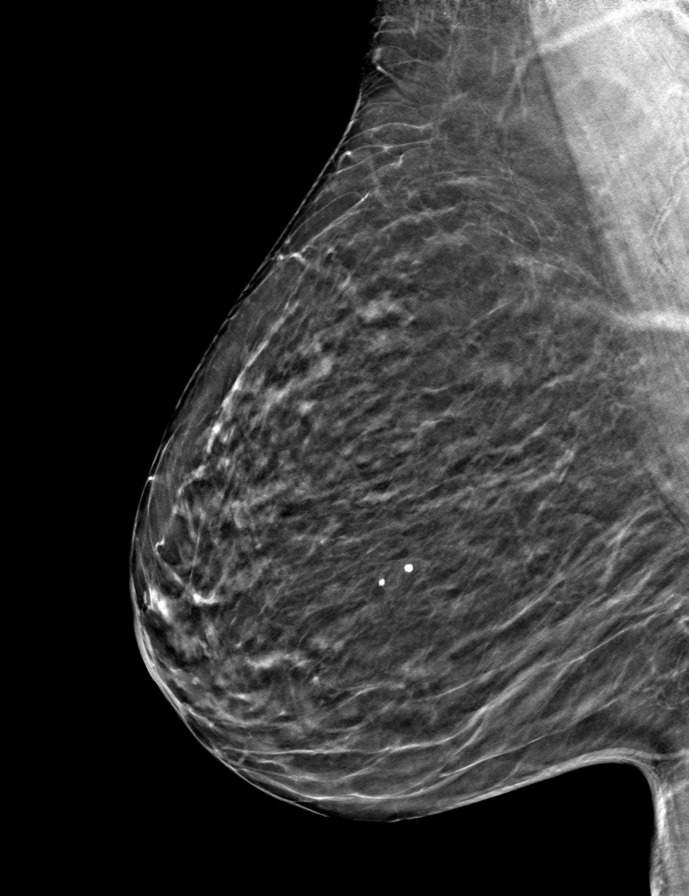

[9 of 24 positions shown; findings below may reference images not displayed]

ACR Breast Density Category b: There are scattered areas of
fibroglandular density.
FINDINGS: There are no findings suspicious for malignancy. The images were
evaluated with computer-aided detection.
IMPRESSION: No mammographic evidence of malignancy. A result letter of this
screening mammogram will be mailed directly to the patient.

RECOMMENDATION:
Screening mammogram in one year. (Code:[id number]-[id number])

BI-RADS CATEGORY  1: Negative.

## 2021-12-28 ENCOUNTER — Telehealth: Payer: Self-pay | Admitting: Cardiovascular Disease

## 2021-12-31 ENCOUNTER — Other Ambulatory Visit: Payer: Self-pay

## 2021-12-31 ENCOUNTER — Encounter: Payer: Self-pay | Admitting: Cardiovascular Disease

## 2021-12-31 ENCOUNTER — Ambulatory Visit (INDEPENDENT_AMBULATORY_CARE_PROVIDER_SITE_OTHER): Payer: Medicare Other | Admitting: Cardiovascular Disease

## 2021-12-31 VITALS — BP 138/60 | HR 65 | Ht 65.0 in | Wt 144.1 lb

## 2021-12-31 DIAGNOSIS — E782 Mixed hyperlipidemia: Secondary | ICD-10-CM | POA: Diagnosis not present

## 2021-12-31 DIAGNOSIS — E119 Type 2 diabetes mellitus without complications: Secondary | ICD-10-CM

## 2021-12-31 DIAGNOSIS — I10 Essential (primary) hypertension: Secondary | ICD-10-CM | POA: Diagnosis not present

## 2021-12-31 DIAGNOSIS — I25118 Atherosclerotic heart disease of native coronary artery with other forms of angina pectoris: Secondary | ICD-10-CM | POA: Diagnosis not present

## 2021-12-31 NOTE — Telephone Encounter (Signed)
Primary Cardiologist:Timothy Rockey Situ, MD  Chart reviewed as part of pre-operative protocol coverage. Because of Lindsey Shaw's past medical history and time since last visit, he/she will require a follow-up visit in order to better assess preoperative cardiovascular risk.  Pre-op covering staff: - Please schedule appointment and call patient to inform them. - Please contact requesting surgeon's office via preferred method (i.e, phone, fax) to inform them of need for appointment prior to surgery.  If applicable, this message will also be routed to pharmacy pool and/or primary cardiologist for input on holding anticoagulant/antiplatelet agent as requested below so that this information is available at time of patient's appointment.   Deberah Pelton, NP  12/31/2021, 11:26 AM

## 2021-12-31 NOTE — Telephone Encounter (Signed)
Left message for the pt on her [phone] # to call the office for pre op appt. Did leave on vm that Dr. Rockey Situ has a 2:40 opening today, though may not be available when she calls back. Procedure is scheduled for 01/15/22. Pt can see Dr. Rockey Situ or any APP.

## 2021-12-31 NOTE — Progress Notes (Signed)
Cardiology Office Note  Date:  12/31/2021   ID:  Lindsey Shaw, [name] [birth year], MRN [id number]  PCP:  Kirk Ruths, MD   Chief Complaint  Patient presents with   Cardiac clearance for left hip replacement.     Patient is scheduled for a left hip replacement on January 31st with Dr. Roland Rack. "Doing well." Medications reviewed by the patient verbally.     HPI:  Lindsey Shaw is a very pleasant 81 year old woman with history of  CAD, nonobstructive by catheterization 2021 diabetes,  hyperlipidemia,  Depression, Chronic kidney disease, back pain November 2016 requiring vertebroplasty,  abnormal EKG,  presenting for f/u of her chest pain and CAD, cath 11/21  In follow-up today reports that cardiac issues have been stable Having severe left hip pain Needs surgery, Jan 31st, Dr. Roland Rack Frequently using a heating pad, rare NSIADS for her hip Uses voltaren gel  Denies any chest pain or shortness of breath concerning for angina Apart from her hip, has been active  Labs reviewed CR 1.4 A1C 6.5 Total chol 152, LDL 69  EKG personally reviewed by myself on todays visit NSR rate LAFB rate 65 bpm  Other past medical history reviewed  admission to [facility] from 11/07/20 through 11/09/20 for chest pain. CTA of the chest showed no evidence of PE  Echo showed an EF of 55 to 40%, grade 1 diastolic dysfunction, normal RV systolic function and ventricular cavity size, and trivial mitral regurgitation.    cath which was performed on 11/08/2020  mild to moderate nonobstructive CAD including sequential 40% ostial and 30% proximal/mid LAD stenoses and sequential 20% proximal and 60% mid LCx stenoses as well as 30% ostial OM1 stenosis.  There was no significant disease noted in the RCA.  Findings were consistent with MI with nonobstructive coronary artery disease    Lab Results  Component Value Date   CHOL 143 11/08/2020   HDL 63 11/08/2020   LDLCALC 69 11/08/2020   TRIG 54 11/08/2020      Hip and knee hurts Sedentary Stress, grandaughter with medical issues, back problem, lives with her   PMH:   has a past medical history of Anxiety; Arthritis; Blood clotting tendency ([place]); Cancer ([place]); Chronic kidney disease; Depression; Diabetes mellitus without complication ([place]); 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[place]) (2006); GERD (gastroesophageal reflux disease); Gout; Hyperlipidemia; Hypertension;   PSH:    Past Surgical History:  Procedure Laterality Date   APPENDECTOMY  1967   BACK SURGERY     2007   CARDIAC CATHETERIZATION     CATARACT EXTRACTION W/PHACO Left 11/05/2016   Procedure: CATARACT EXTRACTION PHACO AND INTRAOCULAR LENS PLACEMENT ([place]);  Surgeon: Birder Robson, MD;  Location: [facility];  Service: Ophthalmology;  Laterality: Left;  Lot # [id number] H [place]: 01:16.4 AP%:24.0 CDE: 18.29   CATARACT EXTRACTION W/PHACO Right 11/26/2016   Procedure: CATARACT EXTRACTION PHACO AND INTRAOCULAR LENS PLACEMENT (IOC);  Surgeon: Birder Robson, MD;  Location: [facility];  Service: Ophthalmology;  Laterality: Right;  [place] 48.1 AP% 15.4 CDE 7.36 Fluid pack lot # [id number]   CHOLECYSTECTOMY  1967   EYE SURGERY     INGUINAL HERNIA REPAIR Right 04/06/2018   Procedure: LAPAROSCOPIC INGUINAL HERNIA;  Surgeon: Robert Bellow, MD;  Location: [facility];  Service: General;  Laterality: Right;   KYPHOPLASTY N/A 11/21/2015   Procedure: ZHYQMVHQION THORACIC 12;  Surgeon: Hessie Knows, MD;  Location: [facility];  Service: Orthopedics;  Laterality: N/A;   LEFT HEART CATH AND CORONARY ANGIOGRAPHY N/A 11/08/2020  Procedure: LEFT HEART CATH AND CORONARY ANGIOGRAPHY;  Surgeon: Nelva Bush, MD;  Location: [facility];  Service: Cardiovascular;  Laterality: N/A;   LUMBAR LAMINECTOMY/DECOMPRESSION MICRODISCECTOMY Right 06/28/2013   Procedure: LUMBAR LAMINECTOMY/DECOMPRESSION MICRODISCECTOMY 1 LEVEL;  Surgeon: Elaina Hoops, MD;  Location: [facility];  Service: Neurosurgery;  Laterality: Right;   Lumbar Laminectomy Decompression Lumbar Four-Five Right   TOE SURGERY Bilateral    BIL GREAT TOE JOINT REMOVED   TOTAL KNEE ARTHROPLASTY Right 04/09/2017   TOTAL KNEE ARTHROPLASTY Right 04/09/2017   Procedure: RIGHT TOTAL KNEE ARTHROPLASTY;  Surgeon: Ninetta Lights, MD;  Location: [facility];  Service: Orthopedics;  Laterality: Right;   TUBAL LIGATION     71    Current Outpatient Medications  Medication Sig Dispense Refill   amLODipine (NORVASC) 10 MG tablet Take 1 tablet (10 mg total) by mouth daily. 180 tablet 3   aspirin EC 81 MG tablet Take 81 mg by mouth every evening.     atorvastatin (LIPITOR) 40 MG tablet Take 40 mg by mouth every evening.     citalopram (CELEXA) 20 MG tablet Take 20 mg by mouth every evening.     diclofenac Sodium (VOLTAREN) 1 % GEL Apply 4 g topically 4 (four) times daily.     Febuxostat 80 MG TABS Take 80 mg by mouth every evening.     gabapentin (NEURONTIN) 100 MG capsule Take 100 mg by mouth at bedtime.      ibuprofen (ADVIL) 200 MG tablet Take 400 mg by mouth every 6 (six) hours as needed for moderate pain.     losartan (COZAAR) 100 MG tablet Take 100 mg by mouth every evening.      metFORMIN (GLUCOPHAGE-XR) 500 MG 24 hr tablet Take 500 mg by mouth in the morning and at bedtime.     omeprazole (PRILOSEC) 20 MG capsule Take 20 mg by mouth every evening.     traMADol (ULTRAM) 50 MG tablet Take 50 mg by mouth daily as needed for severe pain.     No current facility-administered medications for this visit.   Facility-Administered Medications Ordered in Other Visits  Medication Dose Route Frequency Provider Last Rate Last Admin   fentaNYL (SUBLIMAZE) injection 25 mcg  25 mcg Intravenous Q5 min PRN Alvin Critchley, MD   25 mcg at 04/06/18 1227   ondansetron (ZOFRAN) injection 4 mg  4 mg Intravenous Once PRN Alvin Critchley, MD         Allergies:   Keflex [cephalexin]   Social History:  The patient  reports that she has never smoked. She has never used smokeless  tobacco. She reports that she does not drink alcohol and does not use drugs.   Family History:   family history includes AAA (abdominal aortic aneurysm) in her paternal aunt; Breast cancer in her cousin, maternal aunt, and maternal uncle; Lung cancer in her father; Ovarian cancer in her mother; Pancreatic cancer in her mother.    Review of Systems: Review of Systems  Constitutional: Negative.   Respiratory: Negative.    Cardiovascular: Negative.   Gastrointestinal: Negative.   Musculoskeletal:  Positive for joint pain.  Neurological: Negative.   Psychiatric/Behavioral: Negative.    All other systems reviewed and are negative.   PHYSICAL EXAM: VS:  BP 138/60 (BP Location: Left Arm, Patient Position: Sitting, Cuff Size: Normal)    Pulse 65    Ht 5\' 5"  (1.651 m)    Wt 144 lb 2 oz (65.4 kg)    SpO2  98%    BMI 23.98 kg/m  , BMI Body mass index is 23.98 kg/m. Constitutional:  oriented to person, place, and time. No distress.  HENT:  Head: Grossly normal Eyes:  no discharge. No scleral icterus.  Neck: No JVD, no carotid bruits  Cardiovascular: Regular rate and rhythm, no murmurs appreciated Pulmonary/Chest: Clear to to auscultation bilaterally, no wheezes or rails Abdominal: Soft.  no distension.  no tenderness.  Musculoskeletal: Normal range of motion Neurological:  normal muscle tone. Coordination normal. No atrophy Skin: Skin warm and dry Psychiatric: normal affect, pleasant  Recent Labs: No results found for requested labs within last 8760 hours.    Lipid Panel Lab Results  Component Value Date   CHOL 143 11/08/2020   HDL 63 11/08/2020   LDLCALC 69 11/08/2020   TRIG 54 11/08/2020      Wt Readings from Last 3 Encounters:  12/31/21 144 lb 2 oz (65.4 kg)  02/12/21 154 lb (69.9 kg)  11/14/20 151 lb (68.5 kg)     ASSESSMENT AND PLAN:  Preopcardiovascular eval Acceptable risk for hip surgery, on left No further cardiac workup needed Stay on same meds  Mixed  hyperlipidemia Cholesterol is at goal on the current lipid regimen. No changes to the medications were made.  Essential hypertension -  Blood pressure is well controlled on today's visit. No changes made to the medications.  Type 2 diabetes mellitus without complication, without long-term current use of insulin ([place]) -  We have encouraged continued exercise, careful diet management in an effort to lose weight.    Total encounter time more than 25 minutes  Greater than 50% was spent in counseling and coordination of care with the patient    No orders of the defined types were placed in this encounter.    Signed, Esmond Plants, M.D., Ph.D. 12/31/2021  [place], [place]

## 2021-12-31 NOTE — Telephone Encounter (Signed)
° °  Pre-operative Risk Assessment    Patient Name: Lindsey Shaw  DOB: Mar 04, 1941 MRN: [id number]      Request for Surgical Clearance    Procedure:   Left Total Hip Arthroplasty   Date of Surgery:  Clearance 01/15/22                                 Surgeon:  Poggi Surgeon's Group or Practice Name:  [facility]  Phone number:  [phone] Fax number:  [phone]   Type of Clearance Requested:   - Medical  - Pharmacy:  Hold please  advise   Type of Anesthesia:  Not Indicated   Additional requests/questions:    Jonathon Jordan   12/31/2021, 8:35 AM

## 2021-12-31 NOTE — Telephone Encounter (Signed)
Patient returned call to office. Patient accepted appt to see Dr. Rockey Situ 12/31/21 at 2:40 pm.

## 2021-12-31 NOTE — Patient Instructions (Signed)
Medication Instructions:  No changes  If you need a refill on your cardiac medications before your next appointment, please call your pharmacy.   Lab work: No new labs needed  Testing/Procedures: No new testing needed  Follow-Up: At [facility], you and your health needs are our priority.  As part of our continuing mission to provide you with exceptional heart care, we have created designated Provider Care Teams.  These Care Teams include your primary Cardiologist (physician) and Advanced Practice Providers (APPs -  Physician Assistants and Nurse Practitioners) who all work together to provide you with the care you need, when you need it.  You will need a follow up appointment in 12 months  Providers on your designated Care Team:   Christopher Berge, NP Ryan Dunn, PA-C Cadence Furth, PA-C  COVID-19 Vaccine Information can be found at: [email or web address] For questions related to vaccine distribution or appointments, please email [email or web address] or call [phone].   

## 2021-12-31 NOTE — Telephone Encounter (Signed)
Pt called back and is going to see Dr. Rockey Situ today at 2:40 for pre op clearance. I will forward notes to MD for appt today. Will send FYI to requesting office pt has appt today with Dr. Rockey Situ. Once Dr. Rockey Situ has cleared the pt he will fax over his ov note with clearance and recommendations if any.

## 2022-01-02 ENCOUNTER — Other Ambulatory Visit: Payer: Self-pay | Admitting: Surgery

## 2022-01-03 ENCOUNTER — Encounter
Admission: RE | Admit: 2022-01-03 | Discharge: 2022-01-03 | Disposition: A | Discharge status: Home / Self Care | Payer: Medicare Other | Source: Ambulatory Visit | Attending: Surgery | Admitting: Surgery

## 2022-01-03 ENCOUNTER — Other Ambulatory Visit: Payer: Self-pay

## 2022-01-03 VITALS — BP 132/66 | HR 63 | Resp 16 | Ht 65.0 in | Wt 142.0 lb

## 2022-01-03 DIAGNOSIS — M1612 Unilateral primary osteoarthritis, left hip: Secondary | ICD-10-CM | POA: Insufficient documentation

## 2022-01-03 DIAGNOSIS — N179 Acute kidney failure, unspecified: Secondary | ICD-10-CM | POA: Insufficient documentation

## 2022-01-03 DIAGNOSIS — Z01812 Encounter for preprocedural laboratory examination: Secondary | ICD-10-CM | POA: Insufficient documentation

## 2022-01-03 HISTORY — DX: Atherosclerotic heart disease of native coronary artery without angina pectoris: I25.10

## 2022-01-03 HISTORY — DX: Anemia, unspecified: D64.9

## 2022-01-03 LAB — CBC WITH DIFFERENTIAL/PLATELET
Abs Immature Granulocytes: 0.02 10*3/uL (ref 0.00–0.07)
Basophils Absolute: 0 10*3/uL (ref 0.0–0.1)
Basophils Relative: 1 %
Eosinophils Absolute: 0.1 10*3/uL (ref 0.0–0.5)
Eosinophils Relative: 2 %
HCT: 32.8 % — ABNORMAL LOW (ref 36.0–46.0)
Hemoglobin: 10.8 g/dL — ABNORMAL LOW (ref 12.0–15.0)
Immature Granulocytes: 0 %
Lymphocytes Relative: 38 %
Lymphs Abs: 2 10*3/uL (ref 0.7–4.0)
MCH: 30.8 pg (ref 26.0–34.0)
MCHC: 32.9 g/dL (ref 30.0–36.0)
MCV: 93.4 fL (ref 80.0–100.0)
Monocytes Absolute: 0.4 10*3/uL (ref 0.1–1.0)
Monocytes Relative: 7 %
Neutro Abs: 2.8 10*3/uL (ref 1.7–7.7)
Neutrophils Relative %: 52 %
Platelets: 269 10*3/uL (ref 150–400)
RBC: 3.51 MIL/uL — ABNORMAL LOW (ref 3.87–5.11)
RDW: 12.8 % (ref 11.5–15.5)
WBC: 5.3 10*3/uL (ref 4.0–10.5)
nRBC: 0 % (ref 0.0–0.2)

## 2022-01-03 LAB — SURGICAL PCR SCREEN
MRSA, PCR: POSITIVE — AB
Staphylococcus aureus: POSITIVE — AB

## 2022-01-03 LAB — COMPREHENSIVE METABOLIC PANEL WITH GFR
ALT: 14 U/L (ref 0–44)
AST: 19 U/L (ref 15–41)
Albumin: 4.4 g/dL (ref 3.5–5.0)
Alkaline Phosphatase: 92 U/L (ref 38–126)
Anion gap: 10 (ref 5–15)
BUN: 16 mg/dL (ref 8–23)
CO2: 25 mmol/L (ref 22–32)
Calcium: 9.7 mg/dL (ref 8.9–10.3)
Chloride: 101 mmol/L (ref 98–111)
Creatinine, Ser: 0.99 mg/dL (ref 0.44–1.00)
GFR, Estimated: 58 mL/min — ABNORMAL LOW
Glucose, Bld: 100 mg/dL — ABNORMAL HIGH (ref 70–99)
Potassium: 4.1 mmol/L (ref 3.5–5.1)
Sodium: 136 mmol/L (ref 135–145)
Total Bilirubin: 0.5 mg/dL (ref 0.3–1.2)
Total Protein: 7.9 g/dL (ref 6.5–8.1)

## 2022-01-03 LAB — URINALYSIS, ROUTINE W REFLEX MICROSCOPIC
Bilirubin Urine: NEGATIVE
Glucose, UA: NEGATIVE mg/dL
Hgb urine dipstick: NEGATIVE
Ketones, ur: NEGATIVE mg/dL
Leukocytes,Ua: NEGATIVE
Nitrite: NEGATIVE
Protein, ur: NEGATIVE mg/dL
Specific Gravity, Urine: 1.009 (ref 1.005–1.030)
pH: 5 (ref 5.0–8.0)

## 2022-01-03 LAB — TYPE AND SCREEN
ABO/RH(D): A POS
Antibody Screen: NEGATIVE

## 2022-01-03 NOTE — Progress Notes (Signed)
Perioperative Services Pre-Admission/Anesthesia Testing   Date: 01/03/22 Name: Lindsey Shaw MRN:   [id number]  Re: Consideration of preoperative prophylactic antibiotic change   Request sent to: Poggi, Marshall Cork, MD (routed and/or faxed via [facility])  Planned Surgical Procedure(s):    Case: [id number] Date/Time: 01/15/22 0715   Procedure: TOTAL HIP ARTHROPLASTY (Left: Hip)   Anesthesia type: Choice   Pre-op diagnosis: PRIMARY OSTEOARTHRITIS OF LEFT HIP.   Location: [facility] OR ROOM 03 / [place] [facility]   Surgeons: Corky Mull, MD   Clinical Notes:  Patient has a documented allergy to CEPHALEXIN  Advising that this medication has caused her to experience swelling in the past.   Received cephalosporin with no documented complications CEFAZOLIN received 06/28/2013 with no documented ADRs.   Screened as appropriate for cephalosporin use during medication reconciliation No immediate angioedema, dysphagia, SOB, anaphylaxis symptoms. No severe rash involving mucous membranes or skin necrosis. No hospital admissions related to side effects of PCN/cephalosporin use.  No documented reaction to PCN or cephalosporin in the last 10 years.  Request:  As an evidence based approach to reducing the rate of incidence for post-operative SSI and the development of MDROs, could an agent with narrower coverage for preoperative prophylaxis in this patient's upcoming surgical course be considered?   Currently ordered preoperative prophylactic ABX: clindamycin.   Specifically requesting change to cephalosporin (CEFAZOLIN).   Please communicate decision with me and I will change the orders in Epic as per your direction.   Things to consider: Many patients report that they were "allergic" to PCN earlier in life, however this does not translate into a true lifelong allergy. Patients can lose sensitivity to specific IgE antibodies over time if PCN is avoided (Kleris & Lugar, 2019).  Up to 10%  of the adult population and 15% of hospitalized patients report an allergy to PCN, however clinical studies suggest that 90% of those reporting an allergy can tolerate PCN antibiotics (Kleris & Lugar, 2019).  Cross-sensitivity between PCN and cephalosporins has been documented as being as high as 10%, however this estimation included data believed to have been collected in a setting where there was contamination. Newer data suggests that the prevalence of cross-sensitivity between PCN and cephalosporins is actually estimated to be closer to 1% (Hermanides et al., 2018).   Patients labeled as PCN allergic, whether they are truly allergic or not, have been found to have inferior outcomes in terms of rates of serious infection, and these patients tend to have longer hospital stays ([place], 2019).  Treatment related secondary infections, such as Clostridioides difficile, have been linked to the improper use of broad spectrum antibiotics in patients improperly labeled as PCN allergic (Kleris & Lugar, 2019).  Anaphylaxis from cephalosporins is rare and the evidence suggests that there is no increased risk of an anaphylactic type reaction when cephalosporins are used in a PCN allergic patient (Pichichero, 2006).  Citations: Hermanides J, Lemkes BA, Prins Pearla Dubonnet MW, Terreehorst I. Presumed ?-Lactam Allergy and Cross-reactivity in the Operating Theater: A Practical Approach. Anesthesiology. 2018 Aug;129(2):335-342. doi: 10.1097/ALN.0000000000002252. PMID: 26712458.  Kleris, [place]., & Lugar, P. L. (2019). Things We Do For No Reason: Failing to Question a Penicillin Allergy History. Journal of hospital medicine, 14(10), [phone]. Advance online publication. https://www.wallace-m[place]leton.info/  Pichichero, M. E. (2006). Cephalosporins can be prescribed safely for penicillin-allergic patients. Journal of family medicine, 55(2), 106-112. Accessed:  https://cdn.mdedge.com/files/s60fs-public/Document/September-2017/5502JFP_AppliedEvidence1.pdf   Honor Loh, MSN, APRN, FNP-C, [place]  Peri-operative Services  Nurse Practitioner FAX: [phone] 01/03/22 11:15 AM

## 2022-01-03 NOTE — Progress Notes (Signed)
°  Perioperative Services  Abnormal Lab Notification  Date: 01/03/22  Name: Lindsey Shaw MRN:   [id number]  Re: Abnormal labs noted during PAT appointment  Provider Notified: Corky Mull, MD Notification mode: Routed and/or faxed via [facility] of concern: Lab Results  Component Value Date   STAPHAUREUS POSITIVE (A) 01/03/2022   MRSAPCR POSITIVE (A) 01/03/2022    Notes: Patient is scheduled for a TOTAL HIP ARTHROPLASTY (Left: Hip) on 01/15/2022. She is scheduled to receive CLINDAMYCIN pre-operatively. Surgical PCR (+) for MRSA; see above.  PLANS:  Review renal function. Estimated Creatinine Clearance: 42.1 mL/min (by C-G formula based on SCr of 0.99 mg/dL). Review allergies. No documented allergy to vancomycin. CEFAZOLIN discontinued. Order added for VANCOMYCIN 1 GRAM IV to preoperative prophylactic regimen.  Notify primary attending surgeon of (+) MRSA result and additional order placed for antibiotic by perioperative APP.   This is a Community education officer; no formal response is required.  Honor Loh, MSN, APRN, FNP-C, CEN [facility]  Peri-operative Services Nurse Practitioner Phone: [phone] 01/03/22 4:14 PM

## 2022-01-03 NOTE — Patient Instructions (Addendum)
Your procedure is scheduled on: 01/15/22 - Tuesday Report to the Registration Desk on the 1st floor of the [facility]. To find out your arrival time, please call [phone] between 1PM - 3PM on: 01/14/22 - Monday Report to [facility] for Covid Test on 01/11/22 between 8 am and 12 noon.  REMEMBER: Instructions that are not followed completely may result in serious medical risk, up to and including death; or upon the discretion of your surgeon and anesthesiologist your surgery may need to be rescheduled.  Do not eat food after midnight the night before surgery.  No gum chewing, lozengers or hard candies.  You may however, drink CLEAR liquids up to 2 hours before you are scheduled to arrive for your surgery. Do not drink anything within 2 hours of your scheduled arrival time.  Clear liquids include: - water  Type 1 and Type 2 diabetics should only drink water.  TAKE THESE MEDICATIONS THE MORNING OF SURGERY WITH A SIP OF WATER:  - omeprazole (PRILOSEC) 20 MG capsule,  (take one the night before and one on the morning of surgery - helps to prevent nausea after surgery.)  Follow recommendations from Cardiologist, Pulmonologist or PCP regarding stopping Aspirin, Coumadin, Plavix, Eliquis, Pradaxa, or Pletal. Beginning 01/10/22 stop taking aspirin.  - Do Not Take MetFORMIN (GLUCOPHAGE-XR) 500 MG 24 hr tablet on 01/29, 01/30 and Do Not Take The Day Of Surgery.  One week prior to surgery beginning 01/07/22. Stop Anti-inflammatories (NSAIDS) such as Advil, Aleve, Ibuprofen, Motrin, Naproxen, Naprosyn and Aspirin based products such as Excedrin, Goodys Powder, BC Powder.  Stop ANY OVER THE COUNTER supplements until after surgery.  You may however, continue to take Tylenol if needed for pain up until the day of surgery.  No Alcohol for 24 hours before or after surgery.  No Smoking including e-cigarettes for 24 hours prior to surgery.  No chewable tobacco products for at least 6  hours prior to surgery.  No nicotine patches on the day of surgery.  Do not use any "recreational" drugs for at least a week prior to your surgery.  Please be advised that the combination of cocaine and anesthesia may have negative outcomes, up to and including death. If you test positive for cocaine, your surgery will be cancelled.  On the morning of surgery brush your teeth with toothpaste and water, you may rinse your mouth with mouthwash if you wish. Do not swallow any toothpaste or mouthwash.  Use CHG Soap or wipes as directed on instruction sheet.  Do not wear jewelry, make-up, hairpins, clips or nail polish.  Do not wear lotions, powders, or perfumes.   Do not shave body from the neck down 48 hours prior to surgery just in case you cut yourself which could leave a site for infection.  Also, freshly shaved skin may become irritated if using the CHG soap.  Contact lenses, hearing aids and dentures may not be worn into surgery.  Do not bring valuables to the hospital. [facility] is not responsible for any missing/lost belongings or valuables.   Notify your doctor if there is any change in your medical condition (cold, fever, infection).  Wear comfortable clothing (specific to your surgery type) to the hospital.  After surgery, you can help prevent lung complications by doing breathing exercises.  Take deep breaths and cough every 1-2 hours. Your doctor may order a device called an Incentive Spirometer to help you take deep breaths. When coughing or sneezing, hold a pillow firmly  against your incision with both hands. This is called splinting. Doing this helps protect your incision. It also decreases belly discomfort.  If you are being admitted to the hospital overnight, leave your suitcase in the car. After surgery it may be brought to your room.  If you are being discharged the day of surgery, you will not be allowed to drive home. You will need a responsible adult (18  years or older) to drive you home and stay with you that night.   If you are taking public transportation, you will need to have a responsible adult (18 years or older) with you. Please confirm with your physician that it is acceptable to use public transportation.   Please call the [facility]. at [phone] if you have any questions about these instructions.  Surgery Visitation Policy:  Patients undergoing a surgery or procedure may have one family member or support person with them as long as that person is not COVID-19 positive or experiencing its symptoms.  That person may remain in the waiting area during the procedure and may rotate out with other people.  Inpatient Visitation:    Visiting hours are 7 a.m. to 8 p.m. Up to two visitors ages 16+ are allowed at one time in a patient room. The visitors may rotate out with other people during the day. Visitors must check out when they leave, or other visitors will not be allowed. One designated support person may remain overnight. The visitor must pass COVID-19 screenings, use hand sanitizer when entering and exiting the patients room and wear a mask at all times, including in the patients room. Patients must also wear a mask when staff or their visitor are in the room. Masking is required regardless of vaccination status.

## 2022-01-09 ENCOUNTER — Encounter: Payer: Self-pay | Admitting: Surgery

## 2022-01-09 NOTE — Progress Notes (Signed)
Perioperative Services  Pre-Admission/Anesthesia Testing Clinical Review  Date: 01/09/22  Patient Demographics:  Name: Lindsey Shaw DOB:   Dec 25, 1940 MRN:   [id number]  Planned Surgical Procedure(s):    Case: [id number] Date/Time: 01/15/22 0715   Procedure: TOTAL HIP ARTHROPLASTY (Left: Hip)   Anesthesia type: Choice   Pre-op diagnosis: PRIMARY OSTEOARTHRITIS OF LEFT HIP.   Location: [facility] OR ROOM 03 / [place] ORS FOR ANESTHESIA GROUP   Surgeons: Corky Mull, MD   NOTE: Available PAT nursing documentation and vital signs have been reviewed. Clinical nursing staff has updated patient's PMH/PSHx, current medication list, and drug allergies/intolerances to ensure comprehensive history available to assist in medical decision making as it pertains to the aforementioned surgical procedure and anticipated anesthetic course. Extensive review of available clinical information performed. [place] PMH and PSHx updated with any diagnoses/procedures that  may have been inadvertently omitted during her intake with the pre-admission testing department's nursing staff.  Clinical Discussion:  Lindsey Shaw is a 81 y.o. female who is submitted for pre-surgical anesthesia review and clearance prior to her undergoing the above procedure. Patient has never been a smoker. Pertinent PMH includes: CAD, NSTEMI/MINOCA, angina, diastolic dysfunction, DVT, HTN, HLD, T2DM, GERD (on daily PPI), CKD-III, IDA, OA, anxiety, depression.   Patient is followed by cardiology Rockey Situ, MD). She was last seen in the cardiology clinic on 12/31/2021; notes reviewed. At the time of her clinic visit, the patient denied any chest pain, shortness of breath, PND, orthopnea, palpitations, significant peripheral edema, vertiginous symptoms, or presyncope/syncope.  Patient with a PMH significant for cardiovascular diagnoses.  Patient developed a traumatic DVT in her LEFT lower extremity following a mechanical fall back in  2006.  Patient suffered an NSTEMI on 11/07/2020.  She subsequently underwent diagnostic left heart catheterization on 11/08/2020 showing sequential 40 to 30% stenoses of the proximal to mid LAD, sequential 20 and 60% stenosis of the mid LCx, and 30% stenosis of the ostial OM1.  Findings consistent with MINOCA.  Intervention was deferred opting for medical management.  TTE was performed on 11/07/2020 revealing a normal left ventricular systolic function with an EF of 55 to 60%.  There was trivial mitral valve regurgitation.  Diastolic Doppler parameters consistent with impaired relaxation (G1DD).  There was no evidence of a significant transvalvular gradient to suggest mitral or aortic valve stenosis.  Blood pressure reasonably controlled at 138/60 on currently prescribed CCB and ARB therapies.  Patient is on a statin for her HLD and further ASCVD prevention.  T2DM well-controlled on currently prescribed regimen; last Hgb A1c was 6.5% when checked on 11/27/2019.  Functional capacity limited by hip pain and age-related debility, however patient still felt to be able to achieve at least 4 METS of activity without angina/anginal equivalent symptoms.  No changes were made to patient's medication regimen.  Patient to follow-up with outpatient cardiology in 1 year or sooner if needed.  Lindsey Shaw is scheduled for an elective LEFT TOTAL HIP ARTHROPLASTY on 01/15/2022 with Dr. Milagros Evener, MD.  Given patient's past medical history significant for cardiovascular diagnoses, presurgical cardiac clearance was sought by the PAT team. Per cardiology, "based ACC/AHA guidelines, the patient's past medical history, and the amount of time since her last clinic visit, this patient would be at an overall ACCEPTABLE risk for the planned procedure without further cardiovascular testing or intervention at this time".  Of note, this patient is on daily antiplatelet therapy.  She has been encouraged by her primary  cardiologist to  continue this medication throughout the perioperative period.  Patient denies previous perioperative complications with anesthesia in the past. In review of the available records, it is noted that patient underwent a general anesthetic course here (ASA III) in 03/2018 without documented complications.   Vitals with BMI 01/03/2022 12/31/2021 02/12/2021  Height 5\' 5"  5\' 5"  5\' 5"   Weight 142 lbs 144 lbs 2 oz 154 lbs  BMI 23.63 31.54 00.86  Systolic 761 950 932  Diastolic 66 60 70  Pulse 63 65 72    Providers/Specialists:   NOTE: Primary physician provider listed below. Patient may have been seen by APP or partner within same practice.   PROVIDER ROLE / SPECIALTY LAST OV  Poggi, Marshall Cork, MD Orthopedics (Surgeon) 12/26/2021  Kirk Ruths, MD Primary Care Provider 12/03/2021  Ida Rogue, MD Cardiology 12/31/2021   Allergies:  Keflex [cephalexin]  Current Home Medications:   No current facility-administered medications for this encounter.    amLODipine (NORVASC) 10 MG tablet   aspirin EC 81 MG tablet   atorvastatin (LIPITOR) 40 MG tablet   citalopram (CELEXA) 20 MG tablet   diclofenac Sodium (VOLTAREN) 1 % GEL   Febuxostat 80 MG TABS   gabapentin (NEURONTIN) 100 MG capsule   ibuprofen (ADVIL) 200 MG tablet   losartan (COZAAR) 100 MG tablet   metFORMIN (GLUCOPHAGE-XR) 500 MG 24 hr tablet   omeprazole (PRILOSEC) 20 MG capsule   traMADol (ULTRAM) 50 MG tablet    fentaNYL (SUBLIMAZE) injection 25 mcg   ondansetron (ZOFRAN) injection 4 mg   History:   Past Medical History:  Diagnosis Date   Anginal pain (HCC)    Anxiety    Arthritis    CKD (chronic kidney disease), stage III ([place])    Coronary artery disease 11/08/2020   a.) LHC 11/08/2020: no significant/obstructive disease.   Deep vein thrombosis (DVT) of left lower extremity ([place]) 2006   a.) secondary to soft tissue trauma following mechanical fall   Depression    Diastolic dysfunction  67/11/4579   a.) TTE 11/07/2020: EF 55-60%; trivial MR; G1DD   GERD (gastroesophageal reflux disease)    Gout    History of stress test 06/23/2012   a.) Lexiscan: EF 72%; no evidience of ischemia/infarct   Hyperlipidemia    Hypertension    IDA (iron deficiency anemia)    Melanoma ([place]) 2005, 2010   NSTEMI (non-ST elevated myocardial infarction) ([place]) 11/07/2020   a.) LHC 11/08/2020: sequential 40 and 30% stenoses of the p-mLAD, sequential 20 and 60% stenoses of the mLCx, 30% stenosis oOM1; findings consistent with MINOCA; intervention deferred opting for medical management.   Recurrent falls    T2DM (type 2 diabetes mellitus) [facility])    Past Surgical History:  Procedure Laterality Date   APPENDECTOMY  1967   BACK SURGERY     2007   CARDIAC CATHETERIZATION     CATARACT EXTRACTION W/PHACO Left 11/05/2016   Procedure: CATARACT EXTRACTION PHACO AND INTRAOCULAR LENS PLACEMENT ([place]);  Surgeon: Birder Robson, MD;  Location: [facility] ORS;  Service: Ophthalmology;  Laterality: Left;  Lot # [id number] H [place]: 01:16.4 AP%:24.0 CDE: 18.29   CATARACT EXTRACTION W/PHACO Right 11/26/2016   Procedure: CATARACT EXTRACTION PHACO AND INTRAOCULAR LENS PLACEMENT (I[facility]);  Surgeon: Birder Robson, MD;  Location: [facility] ORS;  Service: Ophthalmology;  Laterality: Right;  [place] 48.1 AP% 15.4 CDE 7.36 Fluid pack lot # [id number]   CHOLECYSTECTOMY  1967   COLONOSCOPY     EYE SURGERY     INGUINAL HERNIA REPAIR Right  04/06/2018   Procedure: LAPAROSCOPIC INGUINAL HERNIA;  Surgeon: Robert Bellow, MD;  Location: [facility] ORS;  Service: General;  Laterality: Right;   KYPHOPLASTY N/A 11/21/2015   Procedure: [id number] THORACIC 12;  Surgeon: Hessie Knows, MD;  Location: [facility] ORS;  Service: Orthopedics;  Laterality: N/A;   LEFT HEART CATH AND CORONARY ANGIOGRAPHY N/A 11/08/2020   Procedure: LEFT HEART CATH AND CORONARY ANGIOGRAPHY;  Surgeon: Nelva Bush, MD;  Location: [place] [facility];  Service: Cardiovascular;   Laterality: N/A;   LUMBAR LAMINECTOMY/DECOMPRESSION MICRODISCECTOMY Right 06/28/2013   Procedure: LUMBAR LAMINECTOMY/DECOMPRESSION MICRODISCECTOMY 1 LEVEL;  Surgeon: Elaina Hoops, MD;  Location: [place] [facility] ORS;  Service: Neurosurgery;  Laterality: Right;  Lumbar Laminectomy Decompression Lumbar Four-Five Right   TOE SURGERY Bilateral    BIL GREAT TOE JOINT REMOVED   TOTAL KNEE ARTHROPLASTY Right 04/09/2017   TOTAL KNEE ARTHROPLASTY Right 04/09/2017   Procedure: RIGHT TOTAL KNEE ARTHROPLASTY;  Surgeon: Ninetta Lights, MD;  Location: [place];  Service: Orthopedics;  Laterality: Right;   TUBAL LIGATION     4   Family History  Problem Relation Age of Onset   Ovarian cancer Mother    Pancreatic cancer Mother    Lung cancer Father    AAA (abdominal aortic aneurysm) Paternal Aunt    Breast cancer Maternal Aunt        60's   Breast cancer Maternal Uncle        60's   Breast cancer Cousin        1 cousin-maternal side   Social History   Tobacco Use   Smoking status: Never   Smokeless tobacco: Never  Vaping Use   Vaping Use: Never used  Substance Use Topics   Alcohol use: No   Drug use: No    Pertinent Clinical Results:  LABS: Labs reviewed: Acceptable for surgery.  No visits with results within 3 Day(s) from this visit.  Latest known visit with results is:  Hospital Outpatient Visit on 01/03/2022  Component Date Value Ref Range Status   MRSA, PCR 01/03/2022 POSITIVE (A)  NEGATIVE Final   Comment: RESULT CALLED TO, READ BACK BY AND VERIFIED WITH: Frantz Quattrone 01/03/22 1530 KLW    Staphylococcus aureus 01/03/2022 POSITIVE (A)  NEGATIVE Final   Comment: (NOTE) The Xpert SA Assay (FDA approved for NASAL specimens in patients 27 years of age and older), is one component of a comprehensive surveillance program. It is not intended to diagnose infection nor to guide or monitor treatment. Performed at [facility], [address] [place], [place] [address]    WBC 01/03/2022  5.3  4.0 - 10.5 K/uL Final   RBC 01/03/2022 3.51 (L)  3.87 - 5.11 MIL/uL Final   Hemoglobin 01/03/2022 10.8 (L)  12.0 - 15.0 g/dL Final   HCT 01/03/2022 32.8 (L)  36.0 - 46.0 % Final   MCV 01/03/2022 93.4  80.0 - 100.0 fL Final   MCH 01/03/2022 30.8  26.0 - 34.0 pg Final   MCHC 01/03/2022 32.9  30.0 - 36.0 g/dL Final   RDW 01/03/2022 12.8  11.5 - 15.5 % Final   Platelets 01/03/2022 269  150 - 400 K/uL Final   nRBC 01/03/2022 0.0  0.0 - 0.2 % Final   Neutrophils Relative % 01/03/2022 52  % Final   Neutro Abs 01/03/2022 2.8  1.7 - 7.7 K/uL Final   Lymphocytes Relative 01/03/2022 38  % Final   Lymphs Abs 01/03/2022 2.0  0.7 - 4.0 K/uL Final  Monocytes Relative 01/03/2022 7  % Final   Monocytes Absolute 01/03/2022 0.4  0.1 - 1.0 K/uL Final   Eosinophils Relative 01/03/2022 2  % Final   Eosinophils Absolute 01/03/2022 0.1  0.0 - 0.5 K/uL Final   Basophils Relative 01/03/2022 1  % Final   Basophils Absolute 01/03/2022 0.0  0.0 - 0.1 K/uL Final   Immature Granulocytes 01/03/2022 0  % Final   Abs Immature Granulocytes 01/03/2022 0.02  0.00 - 0.07 K/uL Final   Performed at [facility], [place], [place] [address]   Sodium 01/03/2022 136  135 - 145 mmol/L Final   Potassium 01/03/2022 4.1  3.5 - 5.1 mmol/L Final   Chloride 01/03/2022 101  98 - 111 mmol/L Final   CO2 01/03/2022 25  22 - 32 mmol/L Final   Glucose, Bld 01/03/2022 100 (H)  70 - 99 mg/dL Final   Glucose reference range applies only to samples taken after fasting for at least 8 hours.   BUN 01/03/2022 16  8 - 23 mg/dL Final   Creatinine, Ser 01/03/2022 0.99  0.44 - 1.00 mg/dL Final   Calcium 01/03/2022 9.7  8.9 - 10.3 mg/dL Final   Total Protein 01/03/2022 7.9  6.5 - 8.1 g/dL Final   Albumin 01/03/2022 4.4  3.5 - 5.0 g/dL Final   AST 01/03/2022 19  15 - 41 U/L Final   ALT 01/03/2022 14  0 - 44 U/L Final   Alkaline Phosphatase 01/03/2022 92  38 - 126 U/L Final   Total Bilirubin 01/03/2022 0.5  0.3 - 1.2  mg/dL Final   GFR, Estimated 01/03/2022 58 (L)  >60 mL/min Final   Comment: (NOTE) Calculated using the CKD-EPI Creatinine Equation (2021)    Anion gap 01/03/2022 10  5 - 15 Final   Performed at [facility], [place], [place] [address]   Color, Urine 01/03/2022 YELLOW (A)  YELLOW Final   APPearance 01/03/2022 CLEAR (A)  CLEAR Final   Specific Gravity, Urine 01/03/2022 1.009  1.005 - 1.030 Final   pH 01/03/2022 5.0  5.0 - 8.0 Final   Glucose, UA 01/03/2022 NEGATIVE  NEGATIVE mg/dL Final   Hgb urine dipstick 01/03/2022 NEGATIVE  NEGATIVE Final   Bilirubin Urine 01/03/2022 NEGATIVE  NEGATIVE Final   Ketones, ur 01/03/2022 NEGATIVE  NEGATIVE mg/dL Final   Protein, ur 01/03/2022 NEGATIVE  NEGATIVE mg/dL Final   Nitrite 01/03/2022 NEGATIVE  NEGATIVE Final   Leukocytes,Ua 01/03/2022 NEGATIVE  NEGATIVE Final   Performed at [facility], [address] [place], [place] [address]   ABO/RH(D) 01/03/2022 A POS   Final   Antibody Screen 01/03/2022 NEG   Final   Sample Expiration 01/03/2022 01/17/2022,2359   Final   Extend sample reason 01/03/2022    Final                   Value:NO TRANSFUSIONS OR PREGNANCY IN THE PAST 3 MONTHS Performed at [facility], [address] [place], [place] [address]     ECG: Date: 12/31/2021 Time ECG obtained: 1436 PM Rate: 65 bpm Rhythm: normal sinus; LAFB Axis (leads I and aVF): Normal Intervals: PR 65 ms. QRS 82 ms. QTc 426 ms. ST segment and T wave changes: No evidence of acute ST segment elevation or depression Comparison: Similar to previous tracing obtained on 11/14/2020   IMAGING / PROCEDURES: DIAGNOSTIC RADIOGRAPHS OF LEFT HIP 2-3 VIEWS WITH OR WITHOUT PELVIS performed on 12/26/2021 Moderate to severe degenerative  changes as manifested by medial joint space narrowing and moderate osteophytes along the femoral head. No lytic lesions or fractures are identified. Mild degenerative changes of the RIGHT  hip are also noted.  DIAGNOSTIC RADIOGRAPHS OF LUMBAR SPINE 2-3 VIEWS performed on 12/06/2021 LEFT hip with severe degenerative joint changes with bone-on-bone pathology and irregularity of the femoral head. There is osteophyte formation. The lumbar spine reveals mild L5-S1 disc space narrowing with no subluxation or abnormal curvature.  LEFT HEART CATHETERIZATION AND CORONARY ANGIOGRAPHY performed on 11/08/2020 Mild to moderate nonobstructive coronary artery disease including sequential 40% ostial and 30% proximal/mid LAD stenoses and sequential 20% proximal and 60% mid LCx stenoses as well as 30% ostial OM1 lesion.   No significant disease noted in the RCA.   Findings are consistent with MI with nonobstructive coronary artery disease (MINOCA). Normal left ventricular systolic function with mildly elevated filling pressure. Recommendations: Continue medical therapy and aggressive secondary prevention of coronary artery disease.   Given history of recurrent falls, I favor deferring dual antiplatelet therapy.   Aspirin 81 mg daily should be continued.    TRANSTHORACIC ECHOCARDIOGRAM performed on 11/07/2020 Left ventricular ejection fraction, by estimation, is 55 to 60%. The left ventricle has normal function. Left ventricular endocardial border not optimally defined to evaluate regional wall motion. Left ventricular diastolic parameters are consistent with Grade I diastolic dysfunction (impaired relaxation).  Right ventricular systolic function is normal. The right ventricular size is normal. Tricuspid regurgitation signal is inadequate for assessing PA pressure. The mitral valve was not well visualized. Trivial mitral valve regurgitation. No evidence of mitral stenosis.  The aortic valve was not well visualized. Aortic valve regurgitation is not visualized. No aortic stenosis is present.   Impression and Plan:  Lindsey Shaw has been referred for pre-anesthesia review and clearance  prior to her undergoing the planned anesthetic and procedural courses. Available labs, pertinent testing, and imaging results were personally reviewed by me.  Surgical PCR revealed (+) MRSA.  Preoperative antibiotic selection changed from clindamycin to intravenous vancomycin; surgeon notified.  This patient has been appropriately cleared by cardiology with an overall ACCEPTABLE risk of significant perioperative cardiovascular complications.  Based on clinical review performed today (01/09/22), barring any significant acute changes in the patient's overall condition, it is anticipated that she will be able to proceed with the planned surgical intervention. Any acute changes in clinical condition may necessitate her procedure being postponed and/or cancelled. Patient will meet with anesthesia team (MD and/or CRNA) on the day of her procedure for preoperative evaluation/assessment. Questions regarding anesthetic course will be fielded at that time.   Pre-surgical instructions were reviewed with the patient during her PAT appointment and questions were fielded by PAT clinical staff. Patient was advised that if any questions or concerns arise prior to her procedure then she should return a call to PAT and/or her surgeon's office to discuss.  Honor Loh, MSN, APRN, FNP-C, CEN [facility]  Peri-operative Services Nurse Practitioner Phone: [phone] Fax: [phone] 01/09/22 11:34 AM  NOTE: This note has been prepared using Dragon dictation software. Despite my best ability to proofread, there is always the potential that unintentional transcriptional errors may still occur from this process.

## 2022-01-11 ENCOUNTER — Other Ambulatory Visit
Admission: RE | Admit: 2022-01-11 | Discharge: 2022-01-11 | Disposition: A | Discharge status: Home / Self Care | Payer: Medicare Other | Source: Ambulatory Visit | Attending: Surgery | Admitting: Surgery

## 2022-01-11 ENCOUNTER — Other Ambulatory Visit: Payer: Self-pay

## 2022-01-11 DIAGNOSIS — Z20822 Contact with and (suspected) exposure to covid-19: Secondary | ICD-10-CM | POA: Diagnosis not present

## 2022-01-11 DIAGNOSIS — Z01812 Encounter for preprocedural laboratory examination: Secondary | ICD-10-CM | POA: Insufficient documentation

## 2022-01-11 LAB — SARS CORONAVIRUS 2 (TAT 6-24 HRS): SARS Coronavirus 2: NEGATIVE

## 2022-01-15 ENCOUNTER — Other Ambulatory Visit: Payer: Self-pay

## 2022-01-15 ENCOUNTER — Ambulatory Visit: Payer: Medicare Other | Admitting: Urgent Care

## 2022-01-15 ENCOUNTER — Encounter
Admission: RE | Disposition: A | Discharge status: Home / Self Care | Payer: Self-pay | Source: Home / Self Care | Attending: Surgery

## 2022-01-15 ENCOUNTER — Encounter: Payer: Self-pay | Admitting: Surgery

## 2022-01-15 ENCOUNTER — Observation Stay: Payer: Medicare Other

## 2022-01-15 ENCOUNTER — Observation Stay
Admission: RE | Admit: 2022-01-15 | Discharge: 2022-01-16 | Disposition: A | Discharge status: Home / Self Care | Payer: Medicare Other | Attending: Surgery | Admitting: Surgery

## 2022-01-15 DIAGNOSIS — N1832 Chronic kidney disease, stage 3b: Secondary | ICD-10-CM | POA: Insufficient documentation

## 2022-01-15 DIAGNOSIS — I129 Hypertensive chronic kidney disease with stage 1 through stage 4 chronic kidney disease, or unspecified chronic kidney disease: Secondary | ICD-10-CM | POA: Insufficient documentation

## 2022-01-15 DIAGNOSIS — E1122 Type 2 diabetes mellitus with diabetic chronic kidney disease: Secondary | ICD-10-CM | POA: Diagnosis not present

## 2022-01-15 DIAGNOSIS — Z79899 Other long term (current) drug therapy: Secondary | ICD-10-CM | POA: Insufficient documentation

## 2022-01-15 DIAGNOSIS — I2510 Atherosclerotic heart disease of native coronary artery without angina pectoris: Secondary | ICD-10-CM | POA: Insufficient documentation

## 2022-01-15 DIAGNOSIS — Z7982 Long term (current) use of aspirin: Secondary | ICD-10-CM | POA: Diagnosis not present

## 2022-01-15 DIAGNOSIS — Z7984 Long term (current) use of oral hypoglycemic drugs: Secondary | ICD-10-CM | POA: Insufficient documentation

## 2022-01-15 DIAGNOSIS — Z96642 Presence of left artificial hip joint: Secondary | ICD-10-CM

## 2022-01-15 DIAGNOSIS — M1612 Unilateral primary osteoarthritis, left hip: Principal | ICD-10-CM | POA: Insufficient documentation

## 2022-01-15 HISTORY — PX: TOTAL HIP ARTHROPLASTY: SHX124

## 2022-01-15 HISTORY — DX: Type 2 diabetes mellitus without complications: E11.9

## 2022-01-15 HISTORY — DX: Repeated falls: R29.6

## 2022-01-15 HISTORY — DX: Malignant melanoma of skin, unspecified: C43.9

## 2022-01-15 HISTORY — DX: Chronic kidney disease, stage 3 unspecified: N18.30

## 2022-01-15 HISTORY — DX: Iron deficiency anemia, unspecified: D50.9

## 2022-01-15 LAB — GLUCOSE, CAPILLARY
Glucose-Capillary: 140 mg/dL — ABNORMAL HIGH (ref 70–99)
Glucose-Capillary: 182 mg/dL — ABNORMAL HIGH (ref 70–99)
Glucose-Capillary: 313 mg/dL — ABNORMAL HIGH (ref 70–99)
Glucose-Capillary: 246 mg/dL — ABNORMAL HIGH (ref 70–99)

## 2022-01-15 SURGERY — ARTHROPLASTY, HIP, TOTAL,POSTERIOR APPROACH
Anesthesia: Spinal | Site: Hip | Laterality: Left

## 2022-01-15 MED ORDER — OXYCODONE HCL 5 MG PO TABS
ORAL_TABLET | ORAL | Status: AC
  Administered 2022-01-15: 5 mg via ORAL
  Filled 2022-01-15: qty 1

## 2022-01-15 MED ORDER — PROPOFOL 10 MG/ML IV BOLUS
INTRAVENOUS | Status: DC | PRN
  Administered 2022-01-15 (×2): 10 mg via INTRAVENOUS
  Administered 2022-01-15: 20 mg via INTRAVENOUS

## 2022-01-15 MED ORDER — ONDANSETRON HCL 4 MG/2ML IJ SOLN
4.0000 mg | Freq: Four times a day (QID) | INTRAMUSCULAR | Status: DC | PRN
  Administered 2022-01-15: 4 mg via INTRAVENOUS

## 2022-01-15 MED ORDER — DEXAMETHASONE SODIUM PHOSPHATE 10 MG/ML IJ SOLN
INTRAMUSCULAR | Status: DC | PRN
  Administered 2022-01-15: 10 mg via INTRAVENOUS

## 2022-01-15 MED ORDER — METOCLOPRAMIDE HCL 10 MG PO TABS: 5.0000 mg | ORAL_TABLET | Freq: Three times a day (TID) | ORAL | Status: DC | PRN

## 2022-01-15 MED ORDER — ONDANSETRON HCL 4 MG/2ML IJ SOLN
INTRAMUSCULAR | Status: AC
  Filled 2022-01-15: qty 2

## 2022-01-15 MED ORDER — CHLORHEXIDINE GLUCONATE 0.12 % MT SOLN
OROMUCOSAL | Status: AC
  Administered 2022-01-15: 15 mL via OROMUCOSAL
  Filled 2022-01-15: qty 15

## 2022-01-15 MED ORDER — VANCOMYCIN HCL IN DEXTROSE 1-5 GM/200ML-% IV SOLN
INTRAVENOUS | Status: AC
  Administered 2022-01-15: 1000 mg via INTRAVENOUS
  Filled 2022-01-15: qty 200

## 2022-01-15 MED ORDER — INSULIN ASPART 100 UNIT/ML IJ SOLN
INTRAMUSCULAR | Status: AC
  Administered 2022-01-15: 11 [IU] via SUBCUTANEOUS
  Filled 2022-01-15: qty 1

## 2022-01-15 MED ORDER — DEXMEDETOMIDINE HCL IN NACL 200 MCG/50ML IV SOLN
INTRAVENOUS | Status: DC | PRN
  Administered 2022-01-15 (×2): 10 ug via INTRAVENOUS

## 2022-01-15 MED ORDER — PANTOPRAZOLE SODIUM 40 MG PO TBEC
40.0000 mg | DELAYED_RELEASE_TABLET | Freq: Every day | ORAL | Status: DC
  Administered 2022-01-16: 40 mg via ORAL
  Filled 2022-01-15: qty 1

## 2022-01-15 MED ORDER — PROPOFOL 500 MG/50ML IV EMUL
INTRAVENOUS | Status: DC | PRN
  Administered 2022-01-15: 50 ug/kg/min via INTRAVENOUS

## 2022-01-15 MED ORDER — STERILE WATER FOR IRRIGATION IR SOLN
Status: DC | PRN
Start: 2022-01-15 — End: 2022-01-15
  Administered 2022-01-15: 1000 mL

## 2022-01-15 MED ORDER — GABAPENTIN 100 MG PO CAPS: 100.0000 mg | ORAL_CAPSULE | Freq: Every day | ORAL | Status: DC

## 2022-01-15 MED ORDER — ACETAMINOPHEN 500 MG PO TABS
ORAL_TABLET | ORAL | Status: AC
  Filled 2022-01-15: qty 2

## 2022-01-15 MED ORDER — TRANEXAMIC ACID 1000 MG/10ML IV SOLN
INTRAVENOUS | Status: AC
  Filled 2022-01-15: qty 10

## 2022-01-15 MED ORDER — SODIUM CHLORIDE 0.9 % IV SOLN: INTRAVENOUS | Status: DC

## 2022-01-15 MED ORDER — ATORVASTATIN CALCIUM 20 MG PO TABS
40.0000 mg | ORAL_TABLET | Freq: Every evening | ORAL | Status: DC
  Administered 2022-01-15: 40 mg via ORAL
  Filled 2022-01-15 (×2): qty 2

## 2022-01-15 MED ORDER — LOSARTAN POTASSIUM 50 MG PO TABS
100.0000 mg | ORAL_TABLET | Freq: Every evening | ORAL | Status: DC
  Filled 2022-01-15: qty 2

## 2022-01-15 MED ORDER — CHLORHEXIDINE GLUCONATE 0.12 % MT SOLN: 15.0000 mL | Freq: Once | OROMUCOSAL | Status: AC

## 2022-01-15 MED ORDER — DOCUSATE SODIUM 100 MG PO CAPS
ORAL_CAPSULE | ORAL | Status: AC
  Filled 2022-01-15: qty 1

## 2022-01-15 MED ORDER — METFORMIN HCL ER 500 MG PO TB24
500.0000 mg | ORAL_TABLET | Freq: Two times a day (BID) | ORAL | Status: DC
  Administered 2022-01-15 – 2022-01-16 (×2): 500 mg via ORAL
  Filled 2022-01-15 (×3): qty 1

## 2022-01-15 MED ORDER — APIXABAN 2.5 MG PO TABS
2.5000 mg | ORAL_TABLET | Freq: Two times a day (BID) | ORAL | Status: DC
  Administered 2022-01-16: 2.5 mg via ORAL
  Filled 2022-01-15 (×2): qty 1

## 2022-01-15 MED ORDER — INSULIN ASPART 100 UNIT/ML IJ SOLN: 0.0000 [IU] | Freq: Three times a day (TID) | INTRAMUSCULAR | Status: DC

## 2022-01-15 MED ORDER — KETAMINE HCL 50 MG/5ML IJ SOSY
PREFILLED_SYRINGE | INTRAMUSCULAR | Status: AC
  Filled 2022-01-15: qty 5

## 2022-01-15 MED ORDER — SODIUM CHLORIDE 0.9 % IV BOLUS
500.0000 mL | Freq: Once | INTRAVENOUS | Status: AC
  Administered 2022-01-15: 500 mL via INTRAVENOUS

## 2022-01-15 MED ORDER — KETOROLAC TROMETHAMINE 15 MG/ML IJ SOLN
INTRAMUSCULAR | Status: AC
  Filled 2022-01-15: qty 1

## 2022-01-15 MED ORDER — TRANEXAMIC ACID 1000 MG/10ML IV SOLN
INTRAVENOUS | Status: DC | PRN
  Administered 2022-01-15: 1000 mg via TOPICAL

## 2022-01-15 MED ORDER — FLEET ENEMA 7-19 GM/118ML RE ENEM: 1.0000 | ENEMA | Freq: Once | RECTAL | Status: DC | PRN

## 2022-01-15 MED ORDER — CITALOPRAM HYDROBROMIDE 20 MG PO TABS
20.0000 mg | ORAL_TABLET | Freq: Every evening | ORAL | Status: DC
  Administered 2022-01-15: 20 mg via ORAL
  Filled 2022-01-15 (×2): qty 1

## 2022-01-15 MED ORDER — PHENYLEPHRINE HCL (PRESSORS) 10 MG/ML IV SOLN
INTRAVENOUS | Status: AC
  Filled 2022-01-15: qty 1

## 2022-01-15 MED ORDER — KETOROLAC TROMETHAMINE 15 MG/ML IJ SOLN
INTRAMUSCULAR | Status: AC
  Administered 2022-01-15: 7.5 mg via INTRAVENOUS
  Filled 2022-01-15: qty 1

## 2022-01-15 MED ORDER — PROPOFOL 1000 MG/100ML IV EMUL
INTRAVENOUS | Status: AC
  Filled 2022-01-15: qty 100

## 2022-01-15 MED ORDER — 0.9 % SODIUM CHLORIDE (POUR BTL) OPTIME
TOPICAL | Status: DC | PRN
  Administered 2022-01-15: 500 mL

## 2022-01-15 MED ORDER — KETOROLAC TROMETHAMINE 15 MG/ML IJ SOLN
7.5000 mg | Freq: Four times a day (QID) | INTRAMUSCULAR | Status: AC
  Administered 2022-01-15 – 2022-01-16 (×2): 7.5 mg via INTRAVENOUS

## 2022-01-15 MED ORDER — KETAMINE HCL 10 MG/ML IJ SOLN
INTRAMUSCULAR | Status: DC | PRN
  Administered 2022-01-15 (×2): 10 mg via INTRAVENOUS

## 2022-01-15 MED ORDER — PHENYLEPHRINE HCL-NACL 20-0.9 MG/250ML-% IV SOLN
INTRAVENOUS | Status: DC | PRN
  Administered 2022-01-15: 24 ug/min via INTRAVENOUS

## 2022-01-15 MED ORDER — ORAL CARE MOUTH RINSE: 15.0000 mL | Freq: Once | OROMUCOSAL | Status: AC

## 2022-01-15 MED ORDER — SODIUM CHLORIDE 0.9 % IR SOLN
Status: DC | PRN
  Administered 2022-01-15: 1000 mL
  Administered 2022-01-15: 3000 mL

## 2022-01-15 MED ORDER — OXYCODONE HCL 5 MG PO TABS: 5.0000 mg | ORAL_TABLET | ORAL | Status: DC | PRN

## 2022-01-15 MED ORDER — ONDANSETRON HCL 4 MG/2ML IJ SOLN
INTRAMUSCULAR | Status: DC | PRN
Start: 2022-01-15 — End: 2022-01-15
  Administered 2022-01-15: 4 mg via INTRAVENOUS

## 2022-01-15 MED ORDER — ONDANSETRON HCL 4 MG/2ML IJ SOLN: 4.0000 mg | Freq: Once | INTRAMUSCULAR | Status: DC | PRN

## 2022-01-15 MED ORDER — EPHEDRINE SULFATE (PRESSORS) 50 MG/ML IJ SOLN
INTRAMUSCULAR | Status: DC | PRN
  Administered 2022-01-15 (×2): 5 mg via INTRAVENOUS
  Administered 2022-01-15: 10 mg via INTRAVENOUS
  Administered 2022-01-15: 5 mg via INTRAVENOUS

## 2022-01-15 MED ORDER — TRAMADOL HCL 50 MG PO TABS: 50.0000 mg | ORAL_TABLET | Freq: Four times a day (QID) | ORAL | Status: DC | PRN

## 2022-01-15 MED ORDER — GABAPENTIN 100 MG PO CAPS
ORAL_CAPSULE | ORAL | Status: AC
  Administered 2022-01-15: 100 mg via ORAL
  Filled 2022-01-15: qty 1

## 2022-01-15 MED ORDER — KETOROLAC TROMETHAMINE 15 MG/ML IJ SOLN: 15.0000 mg | Freq: Once | INTRAMUSCULAR | Status: AC

## 2022-01-15 MED ORDER — OXYCODONE HCL 5 MG PO TABS
5.0000 mg | ORAL_TABLET | ORAL | Status: DC | PRN
  Administered 2022-01-16: 5 mg via ORAL

## 2022-01-15 MED ORDER — VANCOMYCIN HCL IN DEXTROSE 1-5 GM/200ML-% IV SOLN: 1000.0000 mg | Freq: Two times a day (BID) | INTRAVENOUS | Status: AC

## 2022-01-15 MED ORDER — DIPHENHYDRAMINE HCL 12.5 MG/5ML PO ELIX
12.5000 mg | ORAL_SOLUTION | ORAL | Status: DC | PRN
  Filled 2022-01-15: qty 10

## 2022-01-15 MED ORDER — VANCOMYCIN HCL IN DEXTROSE 1-5 GM/200ML-% IV SOLN
1000.0000 mg | INTRAVENOUS | Status: AC
Start: 2022-01-15 — End: 2022-01-15

## 2022-01-15 MED ORDER — ACETAMINOPHEN 500 MG PO TABS
1000.0000 mg | ORAL_TABLET | Freq: Four times a day (QID) | ORAL | Status: AC
  Administered 2022-01-15 – 2022-01-16 (×3): 1000 mg via ORAL

## 2022-01-15 MED ORDER — MAGNESIUM HYDROXIDE 400 MG/5ML PO SUSP: 30.0000 mL | Freq: Every day | ORAL | Status: DC | PRN

## 2022-01-15 MED ORDER — FENTANYL CITRATE (PF) 100 MCG/2ML IJ SOLN: 25.0000 ug | INTRAMUSCULAR | Status: DC | PRN

## 2022-01-15 MED ORDER — BUPIVACAINE HCL (PF) 0.5 % IJ SOLN
INTRAMUSCULAR | Status: DC | PRN
  Administered 2022-01-15: 3 mL via INTRATHECAL

## 2022-01-15 MED ORDER — SODIUM CHLORIDE (PF) 0.9 % IJ SOLN
INTRAMUSCULAR | Status: DC | PRN
  Administered 2022-01-15: 90 mL

## 2022-01-15 MED ORDER — LIDOCAINE HCL (CARDIAC) PF 100 MG/5ML IV SOSY
PREFILLED_SYRINGE | INTRAVENOUS | Status: DC | PRN
  Administered 2022-01-15: 100 mg via INTRAVENOUS

## 2022-01-15 MED ORDER — HYDROMORPHONE HCL 1 MG/ML IJ SOLN
INTRAMUSCULAR | Status: DC | PRN
  Administered 2022-01-15 (×2): .5 mg via INTRAVENOUS

## 2022-01-15 MED ORDER — FEBUXOSTAT 40 MG PO TABS
80.0000 mg | ORAL_TABLET | Freq: Every evening | ORAL | Status: DC
  Administered 2022-01-15: 80 mg via ORAL
  Filled 2022-01-15 (×2): qty 2

## 2022-01-15 MED ORDER — EPHEDRINE SULFATE (PRESSORS) 50 MG/ML IJ SOLN
INTRAMUSCULAR | Status: AC
  Filled 2022-01-15: qty 1

## 2022-01-15 MED ORDER — BUPIVACAINE-EPINEPHRINE (PF) 0.5% -1:200000 IJ SOLN
INTRAMUSCULAR | Status: AC
  Filled 2022-01-15: qty 30

## 2022-01-15 MED ORDER — HYDROMORPHONE HCL 1 MG/ML IJ SOLN
INTRAMUSCULAR | Status: AC
  Filled 2022-01-15: qty 1

## 2022-01-15 MED ORDER — HYDROMORPHONE HCL 1 MG/ML IJ SOLN: 0.2500 mg | INTRAMUSCULAR | Status: DC | PRN

## 2022-01-15 MED ORDER — FENTANYL CITRATE (PF) 100 MCG/2ML IJ SOLN
INTRAMUSCULAR | Status: AC
  Filled 2022-01-15: qty 2

## 2022-01-15 MED ORDER — ASPIRIN EC 81 MG PO TBEC
81.0000 mg | DELAYED_RELEASE_TABLET | Freq: Every evening | ORAL | Status: DC
  Filled 2022-01-15: qty 1

## 2022-01-15 MED ORDER — SODIUM CHLORIDE FLUSH 0.9 % IV SOLN
INTRAVENOUS | Status: AC
  Filled 2022-01-15: qty 40

## 2022-01-15 MED ORDER — BISACODYL 10 MG RE SUPP
10.0000 mg | Freq: Every day | RECTAL | Status: DC | PRN
  Filled 2022-01-15: qty 1

## 2022-01-15 MED ORDER — ONDANSETRON HCL 4 MG PO TABS: 4.0000 mg | ORAL_TABLET | Freq: Four times a day (QID) | ORAL | Status: DC | PRN

## 2022-01-15 MED ORDER — KETOROLAC TROMETHAMINE 15 MG/ML IJ SOLN
INTRAMUSCULAR | Status: AC
  Administered 2022-01-15: 15 mg via INTRAVENOUS
  Filled 2022-01-15: qty 1

## 2022-01-15 MED ORDER — DOCUSATE SODIUM 100 MG PO CAPS
100.0000 mg | ORAL_CAPSULE | Freq: Two times a day (BID) | ORAL | Status: DC
  Administered 2022-01-15: 100 mg via ORAL

## 2022-01-15 MED ORDER — METOCLOPRAMIDE HCL 5 MG/ML IJ SOLN: 5.0000 mg | Freq: Three times a day (TID) | INTRAMUSCULAR | Status: DC | PRN

## 2022-01-15 MED ORDER — AMLODIPINE BESYLATE 10 MG PO TABS
10.0000 mg | ORAL_TABLET | Freq: Every day | ORAL | Status: DC
  Administered 2022-01-16: 10 mg via ORAL
  Filled 2022-01-15: qty 1

## 2022-01-15 MED ORDER — ACETAMINOPHEN 500 MG PO TABS
ORAL_TABLET | ORAL | Status: AC
  Filled 2022-01-15: qty 1

## 2022-01-15 MED ORDER — ACETAMINOPHEN 325 MG PO TABS: 325.0000 mg | ORAL_TABLET | Freq: Four times a day (QID) | ORAL | Status: DC | PRN

## 2022-01-15 MED ORDER — VANCOMYCIN HCL 1000 MG IV SOLR
1000.0000 mg | Freq: Once | INTRAVENOUS | Status: DC
  Filled 2022-01-15: qty 20

## 2022-01-15 MED ORDER — BUPIVACAINE LIPOSOME 1.3 % IJ SUSP
INTRAMUSCULAR | Status: AC
  Filled 2022-01-15: qty 20

## 2022-01-15 SURGICAL SUPPLY — 63 items
BIT DRILL JC 5IN 2.4M 127 24FL (BIT) IMPLANT
BLADE SAGITTAL WIDE XTHICK NO (BLADE) ×2 IMPLANT
BLADE SURG SZ20 CARB STEEL (BLADE) ×2 IMPLANT
CHLORAPREP W/TINT 26 (MISCELLANEOUS) ×2 IMPLANT
DRAPE 3/4 80X56 (DRAPES) ×2 IMPLANT
DRAPE IMP U-DRAPE 54X76 (DRAPES) IMPLANT
DRAPE INCISE IOBAN 66X60 STRL (DRAPES) ×2 IMPLANT
DRAPE SURG 17X11 SM STRL (DRAPES) ×4 IMPLANT
DRSG MEPILEX SACRM 8.7X9.8 (GAUZE/BANDAGES/DRESSINGS) ×2 IMPLANT
DRSG OPSITE POSTOP 4X10 (GAUZE/BANDAGES/DRESSINGS) ×2 IMPLANT
ELECT BLADE 6.5 EXT (BLADE) ×1 IMPLANT
ELECT CAUTERY BLADE 6.4 (BLADE) ×1 IMPLANT
GAUZE 4X4 16PLY ~~LOC~~+RFID DBL (SPONGE) ×1 IMPLANT
GAUZE XEROFORM 1X8 LF (GAUZE/BANDAGES/DRESSINGS) ×2 IMPLANT
GLOVE SRG 8 PF TXTR STRL LF DI (GLOVE) ×1 IMPLANT
GLOVE SURG ENC MOIS LTX SZ7.5 (GLOVE) ×8 IMPLANT
GLOVE SURG ENC MOIS LTX SZ8 (GLOVE) ×8 IMPLANT
GLOVE SURG UNDER LTX SZ8 (GLOVE) ×2 IMPLANT
GLOVE SURG UNDER POLY LF SZ8 (GLOVE) ×2
GOWN STRL REUS W/ TWL LRG LVL3 (GOWN DISPOSABLE) ×1 IMPLANT
GOWN STRL REUS W/ TWL XL LVL3 (GOWN DISPOSABLE) ×1 IMPLANT
GOWN STRL REUS W/TWL LRG LVL3 (GOWN DISPOSABLE) ×2
GOWN STRL REUS W/TWL XL LVL3 (GOWN DISPOSABLE) ×2
HEAD CERAMIC BIOLOX 36 (Head) ×1 IMPLANT
HIP SHELL ACETAB 3H 50MM (Hips) ×2 IMPLANT
HOLSTER ELECTROSUGICAL PENCIL (MISCELLANEOUS) ×2 IMPLANT
IV NS IRRIG 3000ML ARTHROMATIC (IV SOLUTION) ×2 IMPLANT
KIT TURNOVER KIT A (KITS) ×2 IMPLANT
LINER ACE G7 36 SZ D HIGH WALL (Liner) ×1 IMPLANT
MANIFOLD NEPTUNE II (INSTRUMENTS) ×2 IMPLANT
MAT ABSORB  FLUID 56X50 GRAY (MISCELLANEOUS) ×2
MAT ABSORB FLUID 56X50 GRAY (MISCELLANEOUS) ×1 IMPLANT
NDL FILTER BLUNT 18X1 1/2 (NEEDLE) ×1 IMPLANT
NDL SAFETY ECLIPSE 18X1.5 (NEEDLE) ×2 IMPLANT
NDL SPNL 20GX3.5 QUINCKE YW (NEEDLE) ×1 IMPLANT
NEEDLE FILTER BLUNT 18X 1/2SAF (NEEDLE) ×1
NEEDLE FILTER BLUNT 18X1 1/2 (NEEDLE) ×1 IMPLANT
NEEDLE HYPO 18GX1.5 SHARP (NEEDLE) ×4
NEEDLE SPNL 20GX3.5 QUINCKE YW (NEEDLE) ×2 IMPLANT
NS IRRIG 500ML POUR BTL (IV SOLUTION) ×1 IMPLANT
PACK HIP PROSTHESIS (MISCELLANEOUS) ×2 IMPLANT
PENCIL SMOKE EVACUATOR (MISCELLANEOUS) ×2 IMPLANT
PIN STEIN SMOOTH 3/16X9 (Pin) IMPLANT
PIN STEIN SMOOTH 4.8X9 (Pin) ×2 IMPLANT
PIN STEINMAN 3/16 (PIN) ×2 IMPLANT
PULSAVAC PLUS IRRIG FAN TIP (DISPOSABLE) ×2
SHELL ACETAB HIP 3H 50MM (Hips) IMPLANT
SPONGE T-LAP 18X18 ~~LOC~~+RFID (SPONGE) ×8 IMPLANT
ST PIN 3/16X9 BAY 6PK (Pin) ×1 IMPLANT
STAPLER SKIN PROX 35W (STAPLE) ×2 IMPLANT
STEM COLLARLESS FULL 14X150MM (Stem) ×1 IMPLANT
SUT TICRON 2-0 30IN 311381 (SUTURE) ×7 IMPLANT
SUT VIC AB 0 CT1 36 (SUTURE) ×2 IMPLANT
SUT VIC AB 1 CT1 36 (SUTURE) ×2 IMPLANT
SUT VIC AB 2-0 CT1 (SUTURE) ×6 IMPLANT
SYR 10ML LL (SYRINGE) ×2 IMPLANT
SYR 20ML LL LF (SYRINGE) ×2 IMPLANT
SYR 30ML LL (SYRINGE) ×4 IMPLANT
TAPE TRANSPORE STRL 2 31045 (GAUZE/BANDAGES/DRESSINGS) ×2 IMPLANT
TIP FAN IRRIG PULSAVAC PLUS (DISPOSABLE) ×1 IMPLANT
TRAP FLUID SMOKE EVACUATOR (MISCELLANEOUS) ×2 IMPLANT
WATER STERILE IRR 1000ML POUR (IV SOLUTION) ×2 IMPLANT
WATER STERILE IRR 500ML POUR (IV SOLUTION) ×1 IMPLANT

## 2022-01-15 NOTE — H&P (Signed)
History of Present Illness:  Lindsey Shaw is a 81 y.o. female who has been referred by Reche Dixon, PA-C, for left hip pain secondary to degenerative joint disease. The patient notes that her symptoms have been present for many years, but have been progressively worsening over the past 6 months or so. She saw Reche Dixon, PA-C, who diagnosed her with progressive degenerative joint disease of the left hip and referred her to me for further evaluation and treatment. She rates her symptoms at 5/10 on today's visit, and has been taking ibuprofen and an occasional tramadol as necessary with limited benefit. She also has been applying heat and Voltaren gel, also with limited benefit. She denies any specific injury which may have contributed to the onset of her symptoms, but does recall several falls over the past year or so and wonders if any of these incidents may have contributed to her symptoms. She does have a history of back issues and has undergone a kyphoplasty in the past. She localizes her pain to the groin region with occasional radiation down toward her knee and intermittent stabbing pains into her left buttock region. She also notes that her pain interferes with her ability to sleep. She denies any numbness or paresthesias down her leg to her foot, and denies any bowel or bladder complaints.  Current Outpatient Medications:  acetaminophen (TYLENOL) 500 MG tablet Take 1-2 tablets (500-1,000 mg total) by mouth every 6 (six) hours as needed   amLODIPine (NORVASC) 10 MG tablet Take 1 tablet (10 mg total) by mouth once daily 90 tablet 3   aspirin 81 MG EC tablet Take 1 tablet (81 mg total) by mouth once daily   atorvastatin (LIPITOR) 40 MG tablet TAKE 1 TABLET BY MOUTH ONCE DAILY 90 tablet 3   blood glucose meter kit by XX route as directed Dx E11.22 1 each 0   citalopram (CELEXA) 20 MG tablet TAKE 1 TABLET BY MOUTH ONCE DAILY 90 tablet 3   colchicine (COLCRYS) 0.6 mg tablet Take 1 tablet (0.6 mg total) by  mouth 2 (two) times daily as needed. 180 tablet 3   cyanocobalamin (VITAMIN B12) 1000 MCG tablet Take 1 tablet (1,000 mcg total) by mouth once daily   cyclobenzaprine (FLEXERIL) 5 MG tablet Take 1 tablet by mouth every 8 hours as needed, may also take tylenol as needed. 40 tablet 3   Febuxostat (ULORIC) 80 mg tablet TAKE 1 TABLET BY MOUTH ONCE DAILY 90 tablet 3   ferrous sulfate 325 (65 FE) MG tablet Take 1 tablet (325 mg total) by mouth once daily   FUROsemide (LASIX) 20 MG tablet Take 1 tablet (20 mg total) by mouth once daily as needed for Edema 30 tablet 11   gabapentin (NEURONTIN) 100 MG capsule Take 1 capsule (100 mg total) by mouth 3 (three) times daily 270 capsule 3   lancets Use 1 each as directed Use as instructed. Dx E11.22 100 each 1   losartan (COZAAR) 100 MG tablet TAKE 1 TABLET BY MOUTH ONCE DAILY 90 tablet 3   metFORMIN (GLUCOPHAGE-XR) 500 MG XR tablet Take 1 tablet (500 mg total) by mouth 2 (two) times daily 180 tablet 3   omeprazole (PRILOSEC) 20 MG DR capsule TAKE 1 CAPSULE BY MOUTH ONCE DAILY 90 capsule 3   ONETOUCH ULTRA TEST test strip TEST BLOOD SUGAR ONCE DAILY AS DIRECTED**NEED PART B 100 strip 1   traMADoL (ULTRAM) 50 mg tablet Take 1 tablet (50 mg total) by mouth every 6 (six)  hours as needed for Pain for up to 30 doses 30 tablet 0   Allergies:   Keflex [Cephalexin] Hives, Itching and Swelling   Past Medical History:   Allergic rhinitis   Anemia (chronic with negative work-up, borderline B-12 levels on oral replacement)   Arthritis   Arthritis, senescent 05/10/2014   B12 deficiency 10/16/2015 (Starting po 10-16)   Cancer (CMS-HCC) - melanoma   Depression   Diabetes mellitus type 2, uncomplicated (CMS-HCC) - mild with albuminuria   DVT (deep venous thrombosis) (CMS-HCC)  to the left lower extremity, diagnosed on 02/01/2005   GERD (gastroesophageal reflux disease)   Gout 05/22/2014   History of bone density study 01/02/2006   History of colonoscopy 11/01/1999  ((RTE) Normal Colon)   History of tetanus, diphtheria, and acellular pertussis booster vaccination (Tdap) 08/07/2011   HTN (hypertension), benign 05/22/2014   Hyperlipidemia   Hyperlipidemia associated with type 2 diabetes mellitus (CMS-HCC) 05/22/2014   Hypertension   Influenza 10/10/2010   Influenza 09/02/2013   NSTEMI (CMS-HCC) 11/07/2020   Postmenopausal   Traumatic closed displaced left proximal humerus fracture 06/22/2020   Type 2 diabetes mellitus with stage 3 chronic kidney disease (CMS-HCC) 05/22/2014   Past Surgical History:   Joseph (bilateral)   toe surgery 1972 (both great toe joint removed)   COLONOSCOPY 11/01/1999 (Normal Colon)   BACK SURGERY 2007 (lumbar disc surgery, Dr. Saintclair Halsted, [facility])   [place] 2014   COLONOSCOPY 05/23/2014 (Int Hemorrhoids, Diverticulosis: CBF 05/2024)   CATARACT EXTRACTION Left 10/2016   CATARACT EXTRACTION Right 11/2016   KNEE ARTHROSCOPY Right 2018   APPENDECTOMY   Melanoma excision Left (left arm)   Family History:   Ovarian cancer Mother   Pancreatic cancer Mother   Lung cancer Father   Breast cancer Maternal Aunt   Diabetes type II Maternal Aunt   Social History:   Socioeconomic History:   Marital status: Widowed  Tobacco Use   Smoking status: Never   Smokeless tobacco: Never  Substance and Sexual Activity   Alcohol use: No   Drug use: No   Sexual activity: Defer   Review of Systems:  A comprehensive 14 point ROS was performed, reviewed, and the pertinent orthopaedic findings are documented in the HPI.  Physical Exam: Vitals:  12/26/21 1114  BP: 118/66  Weight: 65 kg (143 lb 6.4 oz)  Height: 162.6 cm ('5\' 4"' )  PainSc: 5  PainLoc: Hip   General/Constitutional: The patient appears to be well-nourished, well-developed, and in no acute distress. Neuro/Psych: Normal mood and affect, oriented to person, place and time. Eyes: Non-icteric. Pupils are equal, round, and reactive to light,  and exhibit synchronous movement. ENT: Unremarkable. Lymphatic: No palpable adenopathy. Respiratory: Lungs clear to auscultation, Normal chest excursion, No wheezes and Non-labored breathing Cardiovascular: Regular rate and rhythm. No murmurs. and No edema, swelling or tenderness, except as noted in detailed exam. Integumentary: No impressive skin lesions present, except as noted in detailed exam. Musculoskeletal: Unremarkable, except as noted in detailed exam.  Left hip exam: Skin inspection of the left hip is unremarkable. No swelling, erythema, ecchymosis, abrasions, or other skin abnormalities identified. She has mild tenderness to palpation over the anterior more so than lateral aspects of the hip. She is able to arise from a seated position with some difficulty. In stance, her pelvis is level. She is able to heel raise and toe raise without difficulty. She is able to march in place without evidence of hip abductor weakness, but  does experience some discomfort with this activity. Passively, her hip can be flexed to 100 degrees, internally rotated to 10 degrees, and externally rotated to 30 degrees. She has pain at the extremes of all motions. She is neurovascularly intact to her left lower extremity and foot.  X-rays/MRI/Lab data:  X-rays of the pelvis and left hip are obtained. These films demonstrate moderate to severe degenerative changes, as manifest by medial joint space narrowing and moderate osteophytes along the femoral head. No lytic lesions or fractures are identified. Mild degenerative changes of the right hip also are noted.  Assessment:  Primary osteoarthritis of left hip.   Type 2 diabetes mellitus with stage 3b chronic kidney disease, without long-term current use of insulin (CMS-HCC).   Plan: The treatment options were discussed with the patient and her daughter. In addition, patient educational materials were provided regarding the diagnosis and treatment options. The patient  is quite frustrated by her symptoms and functional limitations, and is ready to consider more aggressive treatment options. Therefore, I have recommended a surgical procedure, specifically a left total hip arthroplasty. The procedure was discussed with the patient, as were the potential risks (including bleeding, infection, nerve and/or blood vessel injury, persistent or recurrent pain, loosening and/or failure of the components, dislocation, leg length inequality, need for further surgery, blood clots, strokes, heart attacks and/or arhythmias, pneumonia, etc.) and benefits. The patient states her understanding and wishes to proceed. All of the patient's questions and concerns were answered. She can call any time with further concerns. She will follow up post-surgery, routine.   H&P reviewed and patient re-examined. No changes.

## 2022-01-15 NOTE — Anesthesia Procedure Notes (Signed)
Spinal  Patient location during procedure: OR End time: 01/15/2022 7:48 AM Reason for block: surgical anesthesia Staffing Anesthesiologist: Molli Barrows, MD Preanesthetic Checklist Completed: patient identified, IV checked, site marked, risks and benefits discussed, surgical consent, monitors and equipment checked, pre-op evaluation and timeout performed Spinal Block Patient position: sitting Prep: Betadine Patient monitoring: heart rate, continuous pulse ox, blood pressure and cardiac monitor Approach: midline Location: L4-5 Injection technique: single-shot Needle Needle type: Whitacre, Introducer and Spinocan  Needle gauge: 22 G Needle length: 9 cm Assessment Events: CSF return Additional Notes Negative paresthesia. Negative blood return. Positive free-flowing CSF. Expiration date of kit checked and confirmed. Patient tolerated procedure well, without complications.

## 2022-01-15 NOTE — Evaluation (Signed)
Physical Therapy Evaluation Patient Details Name: Lindsey Shaw MRN: [id number] DOB: 1940/12/17 Today's Date: 01/15/2022  History of Present Illness  Patient is a 81 year old female with degenerative joint disease of left hip. s/p left THA. History of HTN, anxiety, depression, diabetes, angina with exertion, anemia, NSTEMI, recurrent falls  Clinical Impression  Patient is agreeable to PT. Supportive daughters are at the bedside. The patient is planning to discharge home to her daughter's home when discharged. She reports being independent with mobility prior to surgery with activity tolerance limited by pain.  Patient required minimal assistance for bed mobility and transfers today. She ambulated a short distance in the room with rolling walker with cues for technique. Mild dizziness initially with standing with minimal pain reported with activity. Reviewed hip precautions and weight bearing status with patient and family as well as importance of using rolling walker for ambulation. Recommend to continue PT to maximize independence and facilitate return to prior level of function. Anticipated patient will be able to return home with intermittent family assistance at discharge. She his hopeful to go home tomorrow after PT.      Recommendations for follow up therapy are one component of a multi-disciplinary discharge planning process, led by the attending physician.  Recommendations may be updated based on patient status, additional functional criteria and insurance authorization.  Follow Up Recommendations Home health PT    Assistance Recommended at Discharge Intermittent Supervision/Assistance  Patient can return home with the following  A little help with walking and/or transfers;Assist for transportation;Help with stairs or ramp for entrance;A little help with bathing/dressing/bathroom    Equipment Recommendations Rolling walker (2 wheels)  Recommendations for Other Services        Functional Status Assessment Patient has had a recent decline in their functional status and demonstrates the ability to make significant improvements in function in a reasonable and predictable amount of time.     Precautions / Restrictions Precautions Precautions: Fall;Posterior Hip Precaution Booklet Issued: Yes (comment) Restrictions Weight Bearing Restrictions: Yes LLE Weight Bearing: Weight bearing as tolerated      Mobility  Bed Mobility Overal bed mobility: Needs Assistance Bed Mobility: Supine to Sit     Supine to sit: Min assist, HOB elevated     General bed mobility comments: assistance for LLE, verbal cues for technique and sequencing    Transfers Overall transfer level: Needs assistance Equipment used: Rolling walker (2 wheels) Transfers: Sit to/from Stand Sit to Stand: Min assist           General transfer comment: initial lifting assistance required for standing. verbal cues for hand placement for safety    Ambulation/Gait Ambulation/Gait assistance: Min assist, Min guard Gait Distance (Feet): 4 Feet Assistive device: Rolling walker (2 wheels) Gait Pattern/deviations: Step-to pattern, Decreased stride length Gait velocity: decreased     General Gait Details: patient ambulated a short distance in room with verbal cues to increase step length and BLE/rolling walker sequencing. mild dizziness is reported with upright activity  Stairs            Wheelchair Mobility    Modified Rankin (Stroke Patients Only)       Balance Overall balance assessment: Needs assistance Sitting-balance support: No upper extremity supported Sitting balance-Leahy Scale: Good     Standing balance support: Bilateral upper extremity supported, During functional activity, Reliant on assistive device for balance Standing balance-Leahy Scale: Fair  Pertinent Vitals/Pain Pain Assessment Pain Assessment: Faces Faces Pain  Scale: Hurts a little bit Pain Location: incision, left hip Pain Descriptors / Indicators: Discomfort Pain Intervention(s): Limited activity within patient's tolerance, Monitored during session, Ice applied (ice pack applied at end of session)    Home Living Family/patient expects to be discharged to:: Private residence Living Arrangements: Alone Available Help at Discharge: Family;Available 24 hours/day Type of Home: House Home Access: Stairs to enter   [employer] of Steps: 1   Home Layout: Able to live on main level with bedroom/bathroom Home Equipment: BSC/3in1 Additional Comments: information is home living situation at the daughter's home where patient will be going at discharge    Prior Function Prior Level of Function : Independent/Modified Independent             Mobility Comments: she reports she walked with a limp, but did not use assistive device. she drives and participates with community events ADLs Comments: independent     Hand Dominance        Extremity/Trunk Assessment   Upper Extremity Assessment Upper Extremity Assessment: Overall WFL for tasks assessed    Lower Extremity Assessment Lower Extremity Assessment: RLE deficits/detail;LLE deficits/detail RLE Deficits / Details: WFL (dorsiflexion 5/5) RLE Sensation: WNL LLE Deficits / Details: dorsiflexion 5/5, patient able to complete LAQ x 5 reps independently in sitting. patient able to perform quad set and has no knee buckling with weight bearing LLE Sensation: WNL       Communication   Communication: No difficulties  Cognition Arousal/Alertness: Awake/alert Behavior During Therapy: WFL for tasks assessed/performed Overall Cognitive Status: Within Functional Limits for tasks assessed                                 General Comments: patient is able to follow all commands without difficulty. she is cooperative        General Comments      Exercises Total Joint  Exercises Ankle Circles/Pumps: AROM, Strengthening, Both, 10 reps, Supine Quad Sets: AROM, Strengthening, Left, 5 reps, Supine Long Arc Quad: AROM, Strengthening, Left, 5 reps, Seated Other Exercises Other Exercises: verbal cues for exercise technique for strengthening   Assessment/Plan    PT Assessment Patient needs continued PT services  PT Problem List Decreased strength;Decreased range of motion;Decreased activity tolerance;Decreased balance;Decreased mobility;Decreased knowledge of use of DME       PT Treatment Interventions DME instruction;Stair training;Gait training;Functional mobility training;Therapeutic exercise;Therapeutic activities;Balance training;Neuromuscular re-education;Cognitive remediation;Patient/family education    PT Goals (Current goals can be found in the Care Plan section)  Acute Rehab PT Goals Patient Stated Goal: to go home tomorrow after PT PT Goal Formulation: With patient Time For Goal Achievement: 01/29/22 Potential to Achieve Goals: Good    Frequency BID     Co-evaluation               AM-PAC PT "6 Clicks" Mobility  Outcome Measure Help needed turning from your back to your side while in a flat bed without using bedrails?: A Little Help needed moving from lying on your back to sitting on the side of a flat bed without using bedrails?: A Little Help needed moving to and from a bed to a chair (including a wheelchair)?: A Little Help needed standing up from a chair using your arms (e.g., wheelchair or bedside chair)?: A Little Help needed to walk in hospital room?: A Little Help needed climbing 3-5 steps with a  railing? : A Little 6 Click Score: 18    End of Session Equipment Utilized During Treatment: Gait belt Activity Tolerance: Patient tolerated treatment well Patient left: in chair;with call bell/phone within reach;with family/visitor present (2 daughters present in the room) Nurse Communication: Mobility status PT Visit Diagnosis:  Muscle weakness (generalized) (M62.81);Other abnormalities of gait and mobility (R26.89)    Time: 1432-1510 PT Time Calculation (min) (ACUTE ONLY): 38 min   Charges:   PT Evaluation $PT Eval Low Complexity: 1 Low PT Treatments $Gait Training: 8-22 mins $Therapeutic Exercise: 8-22 mins        Minna Merritts, PT, MPT  Percell Locus 01/15/2022, 4:15 PM

## 2022-01-15 NOTE — Care Management Obs Status (Signed)
[place] NOTIFICATION   Patient Details  Name: Lindsey Shaw MRN: [id number] Date of Birth: June 27, 1941   Medicare Observation Status Notification Given:  Yes    Conception Oms, RN 01/15/2022, 4:19 PM

## 2022-01-15 NOTE — TOC Progression Note (Signed)
Transition of Care [facility]) - Progression Note    Patient Details  Name: Lindsey Shaw MRN: [id number] Date of Birth: January 12, 1941  Transition of Care [facility]) CM/SW [place], RN Phone Number: 01/15/2022, 4:17 PM  Clinical Narrative:     Met with the patient at the bedside, I explained the MOON, She will DC home with her daughter and wants to use [facility] for [facility] services, Blanca Friend accepted the patient, She will need a RW and [employer] will deliver to the bedside prior to DC She has transportation with family and can afford her medication       Expected Discharge Plan and Services                                                 Social Determinants of Health (SDOH) Interventions    Readmission Risk Interventions No flowsheet data found.

## 2022-01-15 NOTE — Anesthesia Preprocedure Evaluation (Signed)
Anesthesia Evaluation  Patient identified by MRN, date of birth, ID band Patient awake    Reviewed: Allergy & Precautions, H&P , NPO status , Patient's Chart, lab work & pertinent test results, reviewed documented beta blocker date and time   Airway Mallampati: II   Neck ROM: full    Dental  (+) Poor Dentition, Teeth Intact   Pulmonary neg pulmonary ROS,    Pulmonary exam normal        Cardiovascular Exercise Tolerance: Poor hypertension, On Medications + angina with exertion + CAD and + Past MI  Normal cardiovascular exam Rhythm:regular Rate:Normal     Neuro/Psych PSYCHIATRIC DISORDERS Anxiety Depression negative neurological ROS     GI/Hepatic Neg liver ROS, GERD  Medicated,  Endo/Other  negative endocrine ROSdiabetes, Well Controlled, Type 2, Oral Hypoglycemic Agents  Renal/GU Renal disease  negative genitourinary   Musculoskeletal   Abdominal   Peds  Hematology  (+) Blood dyscrasia, anemia ,   Anesthesia Other Findings Past Medical History: No date: Anginal pain ([place]) No date: Anxiety No date: Arthritis No date: CKD (chronic kidney disease), stage III ([place]) 11/08/2020: Coronary artery disease     Comment:  a.) LHC 11/08/2020: no significant/obstructive disease. 2006: Deep vein thrombosis (DVT) of left lower extremity (HCC)     Comment:  a.) secondary to soft tissue trauma following mechanical              fall No date: Depression 86/76/7209: Diastolic dysfunction     Comment:  a.) TTE 11/07/2020: EF 55-60%; trivial MR; G1DD No date: GERD (gastroesophageal reflux disease) No date: Gout 06/23/2012: History of stress test     Comment:  a.) Lexiscan: EF 72%; no evidience of ischemia/infarct No date: Hyperlipidemia No date: Hypertension No date: IDA (iron deficiency anemia) 2005, 2010: Melanoma ([place]) 11/07/2020: NSTEMI (non-ST elevated myocardial infarction) [facility])     Comment:  a.) LHC 11/08/2020:  sequential 40 and 30% stenoses of               the p-mLAD, sequential 20 and 60% stenoses of the mLCx,               30% stenosis oOM1; findings consistent with MINOCA;               intervention deferred opting for medical management. No date: Recurrent falls No date: T2DM (type 2 diabetes mellitus) ([place]) Past Surgical History: 1967: APPENDECTOMY No date: BACK SURGERY     Comment:  2007 No date: CARDIAC CATHETERIZATION 11/05/2016: CATARACT EXTRACTION W/PHACO; Left     Comment:  Procedure: CATARACT EXTRACTION PHACO AND INTRAOCULAR               LENS PLACEMENT (IOC);  Surgeon: Birder Robson, MD;                Location: [facility] ORS;  Service: Ophthalmology;  Laterality:              Left;  Lot # [id number] H [place]: 01:16.4 AP%:24.0 CDE: 18.29 11/26/2016: CATARACT EXTRACTION W/PHACO; Right     Comment:  Procedure: CATARACT EXTRACTION PHACO AND INTRAOCULAR               LENS PLACEMENT (IOC);  Surgeon: Birder Robson, MD;                Location: [facility] ORS;  Service: Ophthalmology;  Laterality:              Right;  [place] 48.1 AP% 15.4 CDE 7.36 Fluid pack lot #  [id number] H 1967: CHOLECYSTECTOMY No date: COLONOSCOPY No date: EYE SURGERY 04/06/2018: INGUINAL HERNIA REPAIR; Right     Comment:  Procedure: LAPAROSCOPIC INGUINAL HERNIA;  Surgeon:               Robert Bellow, MD;  Location: [place] ORS;  Service:               General;  Laterality: Right; 11/21/2015: KYPHOPLASTY; N/A     Comment:  Procedure: KYPHOPLASTY THORACIC 12;  Surgeon: Hessie Knows, MD;  Location: [facility] ORS;  Service: Orthopedics;                Laterality: N/A; 11/08/2020: LEFT HEART CATH AND CORONARY ANGIOGRAPHY; N/A     Comment:  Procedure: LEFT HEART CATH AND CORONARY ANGIOGRAPHY;                Surgeon: Nelva Bush, MD;  Location: [place]               CV LAB;  Service: Cardiovascular;  Laterality: N/A; 06/28/2013: LUMBAR LAMINECTOMY/DECOMPRESSION MICRODISCECTOMY; Right      Comment:  Procedure: LUMBAR LAMINECTOMY/DECOMPRESSION               MICRODISCECTOMY 1 LEVEL;  Surgeon: Elaina Hoops, MD;                Location: [place] NEURO ORS;  Service: Neurosurgery;                Laterality: Right;  Lumbar Laminectomy Decompression               Lumbar Four-Five Right No date: TOE SURGERY; Bilateral     Comment:  BIL GREAT TOE JOINT REMOVED 04/09/2017: TOTAL KNEE ARTHROPLASTY; Right 04/09/2017: TOTAL KNEE ARTHROPLASTY; Right     Comment:  Procedure: RIGHT TOTAL KNEE ARTHROPLASTY;  Surgeon:               Ninetta Lights, MD;  Location: [place];  Service:               Orthopedics;  Laterality: Right; No date: TUBAL LIGATION     Comment:  71 BMI    Body Mass Index: 22.47 kg/m     Reproductive/Obstetrics negative OB ROS                             Anesthesia Physical Anesthesia Plan  ASA: 3  Anesthesia Plan: Spinal   Post-op Pain Management:    Induction:   PONV Risk Score and Plan: 4 or greater  Airway Management Planned:   Additional Equipment:   Intra-op Plan:   Post-operative Plan:   Informed Consent: I have reviewed the patients History and Physical, chart, labs and discussed the procedure including the risks, benefits and alternatives for the proposed anesthesia with the patient or authorized representative who has indicated his/her understanding and acceptance.     Dental Advisory Given  Plan Discussed with: CRNA  Anesthesia Plan Comments:         Anesthesia Quick Evaluation

## 2022-01-15 NOTE — Transfer of Care (Addendum)
Immediate Anesthesia Transfer of Care Note  Patient: Lindsey Shaw  Procedure(s) Performed: TOTAL HIP ARTHROPLASTY (Left: Hip)  Patient Location: PACU  Anesthesia Type:Spinal  Level of Consciousness: awake  Airway & Oxygen Therapy: Patient Spontanous Breathing  Post-op Assessment: Report given to RN  Post vital signs: stable  Last Vitals:  Vitals Value Taken Time  BP    Temp    Pulse    Resp    SpO2      Last Pain:  Vitals:   01/15/22 0648  TempSrc: Oral  PainSc: 0-No pain         Complications: No notable events documented.

## 2022-01-15 NOTE — Op Note (Signed)
01/15/2022  10:06 AM  Patient:   Lindsey Shaw  Pre-Op Diagnosis:   Degenerative joint disease, left hip.  Post-Op Diagnosis:   Same.  Procedure:   Left total hip arthroplasty.  Surgeon:   Pascal Lux, MD  Assistant:   Cameron Proud, PA-C; Rocco Pauls, PA-S  Anesthesia:   Spinal  Findings:   As above.  Complications:   None  EBL:   150 cc  Fluids:   800 cc crystalloid  UOP:   None  TT:   None  Drains:   None  Closure:   Staples  Implants:   Biomet press-fit system with a #14 laterally offset Echo femoral stem, a 50 mm acetabular shell with an E-poly hi-wall liner, and a 36 mm ceramic head with a -3 mm neck.  Brief Clinical Note:   The patient is a 81 year old female with a history of progressive worsening anterior left hip/groin pain.  Her symptoms have progressed despite medications, activity modification, etc.  Her history and examination are consistent with progressive degenerative joint disease, confirmed by plain radiographs.  She presents at this time for a left total hip arthroplasty.  Procedure:   The patient was brought into the operating room. After adequate spinal anesthesia was obtained, the patient was repositioned in the right lateral decubitus position and secured using a lateral hip positioner. The left hip and lower extremity were prepped with ChloroPrep solution before being draped sterilely. Preoperative antibiotics were administered. A timeout was performed to verify the appropriate surgical site.    A standard posterior approach to the hip was made through an approximately 4-5 inch incision. The incision was carried down through the subcutaneous tissues to expose the gluteal fascia and proximal end of the iliotibial band. These structures were split the length of the incision and the Charnley self-retaining hip retractor placed. The bursal tissues were swept posteriorly to expose the short external rotators. The anterior border of the piriformis  tendon was identified and this plane developed down through the capsule to enter the joint. A flap of tissue was elevated off the posterior aspect of the femoral neck and greater trochanter and retracted posteriorly. This flap included the piriformis tendon, the short external rotators, and the posterior capsule. The soft tissues were elevated off the lateral aspect of the ilium and a large Steinmann pin placed bicortically.   With the left leg aligned over the right, a drill bit was placed into the greater trochanter parallel to the Steinmann pin and the distance between these two pins measured in order to optimize leg lengths postoperatively. The drill bit was removed and the hip dislocated. The piriformis fossa was debrided of soft tissues before the intramedullary canal was accessed through this point using a triple step reamer. The canal was reamed sequentially beginning with a #7 tapered reamer and progressing to a #14 tapered reamer. This provided excellent circumferential chatter. Using the appropriate guide, a femoral neck cut was made 10-12 mm above the lesser trochanter. The femoral head was removed.  Attention was directed to the acetabular side. The labrum was debrided circumferentially before the ligamentum teres was removed using a large curette. A line was drawn on the drapes corresponding to the native version of the acetabulum. This line was used as a guide while the acetabulum was reamed sequentially beginning with a 45 mm reamer and progressing to a 49 mm reamer. This provided excellent circumferential chatter. The 49 mm trial acetabulum was positioned and found to fit quite well.  Therefore, the 50 mm acetabular shell was selected and impacted into place with care taken to maintain the appropriate version. The trial high wall liner was inserted.  Attention was redirected to the femoral side. A box osteotome was used to establish version before the canal was broached sequentially beginning  with a #7 broach and progressing to a #14 broach. This was left in place and several trial reductions performed using both a standard and laterally offset neck options, as well as the -6 mm and -3 mm neck lengths. After removing the trial components, the "manhole cover" was placed into the apex of the acetabular shell and tightened securely. The permanent E-polyethylene hi-wall liner was impacted into the acetabular shell and its locking mechanism verified using a quarter-inch osteotome. Next, the #14 laterally offset femoral stem was impacted into place with care taken to maintain the appropriate version. A repeat trial reduction was performed using the -3 mm neck length. The -3 mm neck length demonstrated excellent stability both in extension and external rotation as well as with flexion to 90 and internal rotation beyond 70. It also was stable in the position of sleep. In addition, leg lengths appeared to be restored appropriately, both by reassessing the position of the right leg over the left, as well as by measuring the distance between the Steinmann pin and the drill bit. The 36 mm ceramic head with the -3 mm neck was impacted onto the stem of the femoral component. The Morse taper locking mechanism was verified using manual distraction before the head was relocated and placed through a range of motion with the findings as described above.  The wound was copiously irrigated with sterile saline solution via the jet lavage system before the peri-incisional and pericapsular tissues were injected with 30 cc of 0.5% Sensorcaine with epinephrine and 20 cc of Exparel diluted out to 60 cc with normal saline to help with postoperative analgesia. The posterior flap was reapproximated to the posterior aspect of the greater trochanter using #2 Tycron interrupted sutures placed through drill holes. Several additional #2 Tycron interrupted sutures were used to reinforce this layer of closure. The iliotibial band was  reapproximated using #1 Vicryl interrupted sutures before the gluteal fascia was closed using a running #1 Vicryl suture. At this point, 1 g of transexemic acid in 10 cc of normal saline was injected into the joint to help reduce postoperative bleeding. The subcutaneous tissues were closed in several layers using 2-0 Vicryl interrupted sutures before the skin was closed using staples. A sterile occlusive dressing was applied to the wound. The patient was then rolled back into the supine position on his/her hospital bed before being awakened and returned to the recovery room in satisfactory condition after tolerating the procedure well.

## 2022-01-16 DIAGNOSIS — M1612 Unilateral primary osteoarthritis, left hip: Secondary | ICD-10-CM | POA: Diagnosis not present

## 2022-01-16 LAB — GLUCOSE, CAPILLARY
Glucose-Capillary: 166 mg/dL — ABNORMAL HIGH (ref 70–99)
Glucose-Capillary: 124 mg/dL — ABNORMAL HIGH (ref 70–99)

## 2022-01-16 LAB — BASIC METABOLIC PANEL
Anion gap: 5 (ref 5–15)
BUN: 34 mg/dL — ABNORMAL HIGH (ref 8–23)
CO2: 20 mmol/L — ABNORMAL LOW (ref 22–32)
Calcium: 8.9 mg/dL (ref 8.9–10.3)
Chloride: 105 mmol/L (ref 98–111)
Creatinine, Ser: 1.68 mg/dL — ABNORMAL HIGH (ref 0.44–1.00)
GFR, Estimated: 31 mL/min — ABNORMAL LOW (ref >60)
Glucose, Bld: 234 mg/dL — ABNORMAL HIGH (ref 70–99)
Potassium: 4.9 mmol/L (ref 3.5–5.1)
Sodium: 130 mmol/L — ABNORMAL LOW (ref 135–145)

## 2022-01-16 LAB — CBC
HCT: 24.2 % — ABNORMAL LOW (ref 36.0–46.0)
Hemoglobin: 7.9 g/dL — ABNORMAL LOW (ref 12.0–15.0)
MCH: 31 pg (ref 26.0–34.0)
MCHC: 32.6 g/dL (ref 30.0–36.0)
MCV: 94.9 fL (ref 80.0–100.0)
Platelets: 208 10*3/uL (ref 150–400)
RBC: 2.55 MIL/uL — ABNORMAL LOW (ref 3.87–5.11)
RDW: 12.8 % (ref 11.5–15.5)
WBC: 11.7 10*3/uL — ABNORMAL HIGH (ref 4.0–10.5)
nRBC: 0 % (ref 0.0–0.2)

## 2022-01-16 LAB — SURGICAL PATHOLOGY

## 2022-01-16 MED ORDER — TRAMADOL HCL 50 MG PO TABS: 50.0000 mg | ORAL_TABLET | Freq: Three times a day (TID) | ORAL | 0 refills | Status: DC | PRN

## 2022-01-16 MED ORDER — ONDANSETRON HCL 4 MG PO TABS
4.0000 mg | ORAL_TABLET | Freq: Four times a day (QID) | ORAL | 0 refills | Status: AC | PRN
Start: 2022-01-16 — End: ?

## 2022-01-16 MED ORDER — OXYCODONE HCL 5 MG PO TABS: 5.0000 mg | ORAL_TABLET | ORAL | 0 refills | Status: AC | PRN

## 2022-01-16 MED ORDER — OXYCODONE HCL 5 MG PO TABS
ORAL_TABLET | ORAL | Status: AC
  Filled 2022-01-16: qty 1

## 2022-01-16 MED ORDER — ACETAMINOPHEN 500 MG PO TABS
500.0000 mg | ORAL_TABLET | Freq: Four times a day (QID) | ORAL | 1 refills | Status: AC | PRN
Start: 2022-01-16 — End: 2023-01-16

## 2022-01-16 MED ORDER — DOCUSATE SODIUM 100 MG PO CAPS
ORAL_CAPSULE | ORAL | Status: AC
  Administered 2022-01-16: 100 mg via ORAL
  Filled 2022-01-16: qty 1

## 2022-01-16 MED ORDER — KETOROLAC TROMETHAMINE 15 MG/ML IJ SOLN
INTRAMUSCULAR | Status: AC
  Filled 2022-01-16: qty 1

## 2022-01-16 MED ORDER — ACETAMINOPHEN 500 MG PO TABS
ORAL_TABLET | ORAL | Status: AC
  Filled 2022-01-16: qty 2

## 2022-01-16 MED ORDER — INSULIN ASPART 100 UNIT/ML IJ SOLN
INTRAMUSCULAR | Status: AC
  Administered 2022-01-16: 3 [IU] via SUBCUTANEOUS
  Filled 2022-01-16: qty 1

## 2022-01-16 MED ORDER — APIXABAN 2.5 MG PO TABS
2.5000 mg | ORAL_TABLET | Freq: Two times a day (BID) | ORAL | 0 refills | Status: AC
Start: 2022-01-16 — End: ?

## 2022-01-16 MED ORDER — ACETAMINOPHEN 500 MG PO TABS
ORAL_TABLET | ORAL | Status: AC
  Administered 2022-01-16: 1000 mg via ORAL
  Filled 2022-01-16: qty 2

## 2022-01-16 MED ORDER — SODIUM CHLORIDE FLUSH 0.9 % IV SOLN
INTRAVENOUS | Status: AC
  Filled 2022-01-16: qty 10

## 2022-01-16 NOTE — Discharge Summary (Signed)
Physician Discharge Summary  Patient ID: Lindsey Shaw MRN: [id number] DOB/AGE: October 01, 1941 81 y.o.  Admit date: 01/15/2022 Discharge date: 01/16/2022  Admission Diagnoses:  Status post total hip replacement, left [Z96.642]  Discharge Diagnoses: Patient Active Problem List   Diagnosis Date Noted   Status post total hip replacement, left 01/15/2022   NSTEMI (non-ST elevated myocardial infarction) ([place]) 11/07/2020   Diabetes mellitus without complication ([place]) 78/58/8502   GERD (gastroesophageal reflux disease) 11/07/2020   Gout 11/07/2020   Depression 11/07/2020   CAD (coronary artery disease) 11/07/2020   CKD (chronic kidney disease), stage IIIa 11/07/2020   Iron deficiency anemia 11/07/2020   Fall at home, initial encounter 11/07/2020   Left wrist fracture 11/07/2020   Right inguinal hernia 03/25/2018   Primary localized osteoarthritis of right knee 04/09/2017   Abnormal EKG 01/22/2016   History of chest pain 01/22/2016   Uncontrolled type 2 diabetes mellitus without complication, without long-term current use of insulin 01/22/2016   Hyperlipidemia 01/22/2016   Essential hypertension 01/22/2016   Intractable pain 10/21/2015    Past Medical History:  Diagnosis Date   Anginal pain ([place])    Anxiety    Arthritis    CKD (chronic kidney disease), stage III ([place])    Coronary artery disease 11/08/2020   a.) LHC 11/08/2020: no significant/obstructive disease.   Deep vein thrombosis (DVT) of left lower extremity ([place]) 2006   a.) secondary to soft tissue trauma following mechanical fall   Depression    Diastolic dysfunction 77/41/2878   a.) TTE 11/07/2020: EF 55-60%; trivial MR; G1DD   GERD (gastroesophageal reflux disease)    Gout    History of stress test 06/23/2012   a.) Lexiscan: EF 72%; no evidience of ischemia/infarct   Hyperlipidemia    Hypertension    IDA (iron deficiency anemia)    Melanoma ([place]) 2005, 2010   NSTEMI (non-ST elevated myocardial infarction)  ([place]) 11/07/2020   a.) LHC 11/08/2020: sequential 40 and 30% stenoses of the p-mLAD, sequential 20 and 60% stenoses of the mLCx, 30% stenosis oOM1; findings consistent with MINOCA; intervention deferred opting for medical management.   Recurrent falls    T2DM (type 2 diabetes mellitus) ([place])      Transfusion: None.   Consultants (if any):   Discharged Condition: Improved  Hospital Course: Lindsey Shaw is an 81 y.o. female who was admitted 01/15/2022 with a diagnosis of degenerative joint disease of the left hip and went to the operating room on 01/15/2022 and underwent the above named procedures.    Surgeries: Procedure(s): TOTAL HIP ARTHROPLASTY on 01/15/2022 Patient tolerated the surgery well. Taken to PACU where she was stabilized and then transferred to the orthopedic floor.  Started on Eliquis 2.5mg  every 12 hours. Foot pumps applied bilaterally at 80 mm. Heels elevated on bed with rolled towels. No evidence of DVT. Negative Homan. Physical therapy started on day #1 for gait training and transfer. OT started day #1 for ADL and assisted devices.  Patient's IV was removed on POD1.  Implants: Biomet press-fit system with a #14 laterally offset Echo femoral stem, a 50 mm acetabular shell with an E-poly hi-wall liner, and a 36 mm ceramic head with a -3 mm neck.  She was given perioperative antibiotics:  Anti-infectives (From admission, onward)    Start     Dose/Rate Route Frequency Ordered Stop   01/15/22 1830  vancomycin (VANCOCIN) IVPB 1000 mg/200 mL premix        1,000 mg 200 mL/hr over 60 Minutes Intravenous  Every 12 hours 01/15/22 1310 01/15/22 1909   01/15/22 0500  vancomycin (VANCOCIN) IVPB 1000 mg/200 mL premix        1,000 mg 200 mL/hr over 60 Minutes Intravenous 60 min pre-op 01/15/22 0111 01/15/22 1300   01/15/22 0100  vancomycin (VANCOCIN) 1,000 mg in sodium chloride 0.9 % 250 mL IVPB  Status:  Discontinued        1,000 mg 250 mL/hr over 60 Minutes Intravenous   Once 01/15/22 0052 01/15/22 0110     .  She was given sequential compression devices, early ambulation, and Eliquis for DVT prophylaxis.  She benefited maximally from the hospital stay and there were no complications.    Recent vital signs:  Vitals:   01/16/22 0400 01/16/22 0803  BP: 138/70 (!) 127/54  Pulse: 87 73  Resp: 19 20  Temp: (!) 97.4 F (36.3 C) 97.9 F (36.6 C)  SpO2: 97% 99%    Recent laboratory studies:  Lab Results  Component Value Date   HGB 7.9 (L) 01/16/2022   HGB 10.8 (L) 01/03/2022   HGB 11.5 11/14/2020   Lab Results  Component Value Date   WBC 11.7 (H) 01/16/2022   PLT 208 01/16/2022   Lab Results  Component Value Date   INR 1.0 11/07/2020   Lab Results  Component Value Date   NA 130 (L) 01/16/2022   K 4.9 01/16/2022   CL 105 01/16/2022   CO2 20 (L) 01/16/2022   BUN 34 (H) 01/16/2022   CREATININE 1.68 (H) 01/16/2022   GLUCOSE 234 (H) 01/16/2022    Discharge Medications:   Allergies as of 01/16/2022       Reactions   Keflex [cephalexin] Swelling   SWELLING REACTION UNSPECIFIED         Medication List     STOP taking these medications    aspirin EC 81 MG tablet   ibuprofen 200 MG tablet Commonly known as: ADVIL       TAKE these medications    acetaminophen 500 MG tablet Commonly known as: TYLENOL Take 1-2 tablets (500-1,000 mg total) by mouth every 6 (six) hours as needed.   amLODipine 10 MG tablet Commonly known as: NORVASC Take 1 tablet (10 mg total) by mouth daily.   apixaban 2.5 MG Tabs tablet Commonly known as: ELIQUIS Take 1 tablet (2.5 mg total) by mouth 2 (two) times daily.   atorvastatin 40 MG tablet Commonly known as: LIPITOR Take 40 mg by mouth every evening.   citalopram 20 MG tablet Commonly known as: CELEXA Take 20 mg by mouth every evening.   diclofenac Sodium 1 % Gel Commonly known as: VOLTAREN Apply 4 g topically 4 (four) times daily.   Febuxostat 80 MG Tabs Take 80 mg by mouth every  evening.   gabapentin 100 MG capsule Commonly known as: NEURONTIN Take 100 mg by mouth at bedtime.   losartan 100 MG tablet Commonly known as: COZAAR Take 100 mg by mouth every evening.   metFORMIN 500 MG 24 hr tablet Commonly known as: GLUCOPHAGE-XR Take 500 mg by mouth in the morning and at bedtime.   omeprazole 20 MG capsule Commonly known as: PRILOSEC Take 20 mg by mouth every evening.   ondansetron 4 MG tablet Commonly known as: ZOFRAN Take 1 tablet (4 mg total) by mouth every 6 (six) hours as needed for nausea.   oxyCODONE 5 MG immediate release tablet Commonly known as: Oxy IR/ROXICODONE Take 1-2 tablets (5-10 mg total) by mouth every 4 (four) hours  as needed for moderate pain (pain score 4-6).   traMADol 50 MG tablet Commonly known as: ULTRAM Take 50 mg by mouth daily as needed for severe pain.               Durable Medical Equipment  (From admission, onward)           Start     Ordered   01/15/22 1311  DME Bedside commode  Once       Question:  Patient needs a bedside commode to treat with the following condition  Answer:  Status post total hip replacement, left   01/15/22 1310   01/15/22 1311  DME 3 n 1  Once        01/15/22 1310   01/15/22 1311  DME Walker rolling  Once       Question Answer Comment  Walker: With 5 Inch Wheels   Patient needs a walker to treat with the following condition Status post total hip replacement, left      01/15/22 1310            Diagnostic Studies: DG HIP UNILAT W OR W/O PELVIS 2-3 VIEWS LEFT  Result Date: 01/15/2022 CLINICAL DATA:  Postop EXAM: DG HIP (WITH OR WITHOUT PELVIS) 2-3V LEFT COMPARISON:  None. FINDINGS: Recent postoperative changes of a total left hip arthroplasty. Hardware appears aligned and intact. No acute fracture or dislocation visualized. Small amount of subcutaneous emphysema and overlying surgical staples. IMPRESSION: Left hip arthroplasty.  No acute fracture identified. Electronically  Signed   By: Ofilia Neas M.D.   On: 01/15/2022 10:57    Disposition: Plan for discharge home today pending progress with PT.     Follow-up Information     Lattie Corns, PA-C Follow up in 14 day(s).   Specialty: Physician Assistant Why: Electa Sniff information: [place] [id number] [phone]                Signed: Judson Roch PA-C 01/16/2022, 11:49 AM

## 2022-01-16 NOTE — Anesthesia Postprocedure Evaluation (Deleted)
Anesthesia Post Note  Patient: Lindsey Shaw  Procedure(s) Performed: TOTAL HIP ARTHROPLASTY (Left: Hip)  Patient location during evaluation: ICU Anesthesia Type: General Level of consciousness: awake and alert Pain management: pain level controlled Vital Signs Assessment: post-procedure vital signs reviewed and stable Respiratory status: spontaneous breathing, nonlabored ventilation, respiratory function stable and patient connected to nasal cannula oxygen Cardiovascular status: blood pressure returned to baseline and stable Postop Assessment: no apparent nausea or vomiting Anesthetic complications: no   No notable events documented.   Last Vitals:  Vitals:   01/16/22 0400 01/16/22 0803  BP: 138/70 (!) 127/54  Pulse: 87 73  Resp: 19 20  Temp: (!) 36.3 C 36.6 C  SpO2: 97% 99%    Last Pain:  Vitals:   01/16/22 0803  TempSrc: Tympanic  PainSc:                  Alison Stalling

## 2022-01-16 NOTE — Progress Notes (Signed)
Physical Therapy Treatment Patient Details Name: Lindsey Shaw MRN: [id number] DOB: 11-01-1941 Today's Date: 01/16/2022   History of Present Illness Patient is a 81 year old female with degenerative joint disease of left hip. s/p left THA. History of HTN, anxiety, depression, diabetes, angina with exertion, anemia, NSTEMI, recurrent falls    PT Comments    Pt was awake and alert resting in bed upon PT entrance into the room. She reports her pain is currently 1/10 at rest. Pt was tested on her posterior hip precautions and was able to recall them to PT without any issues. She completed bed mobility (sit <> supine) w/ supervision. Once seated at EOB she was able to perform sit to stand w/ CGA using a RW and was able to ambulate ~145ft w/ CGA and using a RW. Pt reported no increase in pain throughout session. Pt will benefit from continued skilled PT in order to improve LE strength, mobility, gait, decrease c/o pain, and restore PLOF. Current discharge recommendation remains appropriate due to the level of assistance required by the patient to ensure safety and improve overall function.     Recommendations for follow up therapy are one component of a multi-disciplinary discharge planning process, led by the attending physician.  Recommendations may be updated based on patient status, additional functional criteria and insurance authorization.  Follow Up Recommendations  Home health PT     Assistance Recommended at Discharge Intermittent Supervision/Assistance  Patient can return home with the following A little help with walking and/or transfers;Assist for transportation;Help with stairs or ramp for entrance;A little help with bathing/dressing/bathroom   Equipment Recommendations  Rolling walker (2 wheels)    Recommendations for Other Services       Precautions / Restrictions Precautions Precautions: Fall;Posterior Hip Precaution Booklet Issued: Yes (comment) Restrictions Weight  Bearing Restrictions: Yes LLE Weight Bearing: Weight bearing as tolerated     Mobility  Bed Mobility Overal bed mobility: Needs Assistance Bed Mobility: Supine to Sit, Sit to Supine     Supine to sit: HOB elevated, Supervision Sit to supine: HOB elevated, Supervision        Transfers Overall transfer level: Needs assistance Equipment used: Rolling walker (2 wheels) Transfers: Sit to/from Stand Sit to Stand: Min guard                Ambulation/Gait Ambulation/Gait assistance: Min guard Gait Distance (Feet): 125 Feet Assistive device: Rolling walker (2 wheels) Gait Pattern/deviations: Step-to pattern, Decreased stride length Gait velocity: decreased         Stairs             Wheelchair Mobility    Modified Rankin (Stroke Patients Only)       Balance Overall balance assessment: Needs assistance Sitting-balance support: Bilateral upper extremity supported, Feet supported Sitting balance-Leahy Scale: Good     Standing balance support: Bilateral upper extremity supported, During functional activity, Reliant on assistive device for balance Standing balance-Leahy Scale: Fair                              Cognition Arousal/Alertness: Awake/alert Behavior During Therapy: WFL for tasks assessed/performed Overall Cognitive Status: Within Functional Limits for tasks assessed                                          Exercises  General Comments        Pertinent Vitals/Pain Pain Assessment Pain Assessment: 0-10 Pain Score: 1  Pain Location: incision, left hip Pain Descriptors / Indicators: Discomfort Pain Intervention(s): Limited activity within patient's tolerance, Monitored during session, Ice applied, Repositioned, Premedicated before session    Home Living                          Prior Function            PT Goals (current goals can now be found in the care plan section) Progress towards PT  goals: Progressing toward goals    Frequency    BID      PT Plan Current plan remains appropriate    Co-evaluation              AM-PAC PT "6 Clicks" Mobility   Outcome Measure  Help needed turning from your back to your side while in a flat bed without using bedrails?: A Little Help needed moving from lying on your back to sitting on the side of a flat bed without using bedrails?: A Little Help needed moving to and from a bed to a chair (including a wheelchair)?: A Little Help needed standing up from a chair using your arms (e.g., wheelchair or bedside chair)?: A Little Help needed to walk in hospital room?: A Little Help needed climbing 3-5 steps with a railing? : A Little 6 Click Score: 18    End of Session Equipment Utilized During Treatment: Gait belt Activity Tolerance: Patient tolerated treatment well;No increased pain Patient left: in bed;with call bell/phone within reach;with bed alarm set Nurse Communication: Mobility status PT Visit Diagnosis: Muscle weakness (generalized) (M62.81);Other abnormalities of gait and mobility (R26.89)     Time: 7824-2353 PT Time Calculation (min) (ACUTE ONLY): 18 min  Charges:                         Jonnie Kind, SPT 01/16/2022, 11:07 AM

## 2022-01-16 NOTE — Progress Notes (Signed)
°  Subjective: 1 Day Post-Op Procedure(s) (LRB): TOTAL HIP ARTHROPLASTY (Left) Patient reports pain as mild.   Patient is well, and has had no acute complaints or problems Plan is to go Home after hospital stay. Negative for chest pain and shortness of breath Fever: no Gastrointestinal:Negative for nausea and vomiting  Objective: Vital signs in last 24 hours: Temp:  [96.8 F (36 C)-98.1 F (36.7 C)] 97.9 F (36.6 C) (02/01 0803) Pulse Rate:  [70-87] 73 (02/01 0803) Resp:  [19-28] 20 (02/01 0803) BP: (100-138)/(48-93) 127/54 (02/01 0803) SpO2:  [95 %-100 %] 99 % (02/01 0803)  Intake/Output from previous day:  Intake/Output Summary (Last 24 hours) at 01/16/2022 1145 Last data filed at 01/15/2022 1500 Gross per 24 hour  Intake 2110.03 ml  Output 150 ml  Net 1960.03 ml    Intake/Output this shift: No intake/output data recorded.  Labs: Recent Labs    01/16/22 0521  HGB 7.9*   Recent Labs    01/16/22 0521  WBC 11.7*  RBC 2.55*  HCT 24.2*  PLT 208   Recent Labs    01/16/22 0521  NA 130*  K 4.9  CL 105  CO2 20*  BUN 34*  CREATININE 1.68*  GLUCOSE 234*  CALCIUM 8.9   No results for input(s): LABPT, INR in the last 72 hours.   EXAM General - Patient is Alert, Appropriate, and Oriented Extremity - ABD soft Neurovascular intact Dorsiflexion/Plantar flexion intact Incision: dressing C/D/I No cellulitis present Compartment soft Dressing/Incision - clean, dry, no drainage Motor Function - intact, moving foot and toes well on exam.  Abdomen soft with normal bowels sounds.  Past Medical History:  Diagnosis Date   Anginal pain ([place])    Anxiety    Arthritis    CKD (chronic kidney disease), stage III ([place])    Coronary artery disease 11/08/2020   a.) LHC 11/08/2020: no significant/obstructive disease.   Deep vein thrombosis (DVT) of left lower extremity ([place]) 2006   a.) secondary to soft tissue trauma following mechanical fall   Depression    Diastolic  dysfunction 67/61/9509   a.) TTE 11/07/2020: EF 55-60%; trivial MR; G1DD   GERD (gastroesophageal reflux disease)    Gout    History of stress test 06/23/2012   a.) Lexiscan: EF 72%; no evidience of ischemia/infarct   Hyperlipidemia    Hypertension    IDA (iron deficiency anemia)    Melanoma ([place]) 2005, 2010   NSTEMI (non-ST elevated myocardial infarction) ([place]) 11/07/2020   a.) LHC 11/08/2020: sequential 40 and 30% stenoses of the p-mLAD, sequential 20 and 60% stenoses of the mLCx, 30% stenosis oOM1; findings consistent with MINOCA; intervention deferred opting for medical management.   Recurrent falls    T2DM (type 2 diabetes mellitus) ([place])     Assessment/Plan: 1 Day Post-Op Procedure(s) (LRB): TOTAL HIP ARTHROPLASTY (Left) Principal Problem:   Status post total hip replacement, left  Estimated body mass index is 22.47 kg/m as calculated from the following:   Height as of this encounter: 5\' 5"  (1.651 m).   Weight as of this encounter: 61.2 kg. Advance diet Up with therapy D/C IV fluids  Labs reviewed this AM.  Hg 7.9, denies any dizziness or SOB. Na 130, d/c IVF.  Encouraged increased oral intake. Up with therapy today. Plan for discharge home after working with therapy.  DVT Prophylaxis - TED hose and Eliquis Weight-Bearing as tolerated to left leg  J. Cameron Proud, PA-C [facility] 01/16/2022, 11:45 AM

## 2022-01-16 NOTE — Anesthesia Postprocedure Evaluation (Signed)
Anesthesia Post Note  Patient: Lindsey Shaw  Procedure(s) Performed: TOTAL HIP ARTHROPLASTY (Left: Hip)  Patient location during evaluation: Nursing Unit Anesthesia Type: Spinal Level of consciousness: oriented and awake and alert Pain management: pain level controlled Vital Signs Assessment: post-procedure vital signs reviewed and stable Respiratory status: spontaneous breathing and respiratory function stable Cardiovascular status: blood pressure returned to baseline and stable Postop Assessment: no headache, no backache, no apparent nausea or vomiting and patient able to bend at knees Anesthetic complications: no   No notable events documented.   Last Vitals:  Vitals:   01/16/22 0400 01/16/22 0803  BP: 138/70 (!) 127/54  Pulse: 87 73  Resp: 19 20  Temp: (!) 36.3 C 36.6 C  SpO2: 97% 99%    Last Pain:  Vitals:   01/16/22 0803  TempSrc: Tympanic  PainSc:                  Alison Stalling

## 2022-01-16 NOTE — Discharge Instructions (Signed)
Instructions after Total Hip Replacement     J. Jeffrey Poggi, M.D.  J. Lance Bellina Tokarczyk, PA-C     Dept. of Orthopaedics & Sports Medicine  [facility]  [address]  [place], [place]  [address]  Phone: [phone]   Fax: [phone]    DIET: . Drink plenty of non-alcoholic fluids. . Resume your normal diet. Include foods high in fiber.  ACTIVITY:  . You may use crutches or a walker with weight-bearing as tolerated, unless instructed otherwise. . You may be weaned off of the walker or crutches by your Physical Therapist.  . Do NOT reach below the level of your knees or cross your legs until allowed.    . Continue doing gentle exercises. Exercising will reduce the pain and swelling, increase motion, and prevent muscle weakness.   . Please continue to use the TED compression stockings for 6 weeks. You may remove the stockings at night, but should reapply them in the morning. . Do not drive or operate any equipment until instructed.  WOUND CARE:  . Continue to use ice packs periodically to reduce pain and swelling. . Keep the incision clean and dry. . You may bathe or shower after the staples are removed at the first office visit following surgery.  MEDICATIONS: . You may resume your regular medications. . Please take the pain medication as prescribed on the medication. . Do not take pain medication on an empty stomach. . You have been given a prescription for a blood thinner to prevent blood clots. Please take the medication as instructed. (NOTE: After completing a 2 week course of Lovenox, take one Enteric-coated aspirin once a day.) . Pain medications and iron supplements can cause constipation. Use a stool softener (Senokot or Colace) on a daily basis and a laxative (dulcolax or miralax) as needed. . Do not drive or drink alcoholic beverages when taking pain medications.  CALL THE OFFICE FOR: . Temperature above 101 degrees . Excessive bleeding or drainage on the  dressing. . Excessive swelling, coldness, or paleness of the toes. . Persistent nausea and vomiting.  FOLLOW-UP:  . You should have an appointment to return to the office in 2 weeks after surgery. . Arrangements have been made for continuation of Physical Therapy (either home therapy or outpatient therapy).  

## 2022-02-07 ENCOUNTER — Other Ambulatory Visit: Payer: Self-pay | Admitting: Internal Medicine

## 2022-02-07 DIAGNOSIS — Z1231 Encounter for screening mammogram for malignant neoplasm of breast: Secondary | ICD-10-CM

## 2022-02-22 ENCOUNTER — Encounter: Payer: Self-pay | Admitting: Surgery

## 2022-04-02 ENCOUNTER — Ambulatory Visit
Admission: RE | Admit: 2022-04-02 | Discharge: 2022-04-02 | Disposition: A | Discharge status: Home / Self Care | Payer: Medicare Other | Source: Ambulatory Visit | Attending: Internal Medicine | Admitting: Internal Medicine

## 2022-04-02 DIAGNOSIS — Z1231 Encounter for screening mammogram for malignant neoplasm of breast: Secondary | ICD-10-CM | POA: Diagnosis present

## 2022-07-24 ENCOUNTER — Other Ambulatory Visit: Payer: Self-pay | Admitting: Physician Assistant

## 2022-07-24 ENCOUNTER — Ambulatory Visit
Admission: RE | Admit: 2022-07-24 | Discharge: 2022-07-24 | Disposition: A | Discharge status: Home / Self Care | Payer: Medicare Other | Source: Ambulatory Visit | Attending: Physician Assistant | Admitting: Physician Assistant

## 2022-07-24 DIAGNOSIS — S0990XA Unspecified injury of head, initial encounter: Secondary | ICD-10-CM | POA: Insufficient documentation

## 2023-02-14 DEATH — deceased

## 2023-03-03 ENCOUNTER — Other Ambulatory Visit: Payer: Self-pay | Admitting: Internal Medicine

## 2023-03-03 DIAGNOSIS — Z1231 Encounter for screening mammogram for malignant neoplasm of breast: Secondary | ICD-10-CM

## 2023-04-07 ENCOUNTER — Ambulatory Visit
Admission: RE | Admit: 2023-04-07 | Discharge: 2023-04-07 | Disposition: A | Discharge status: Home / Self Care | Payer: Medicare Other | Source: Ambulatory Visit | Attending: Internal Medicine | Admitting: Internal Medicine

## 2023-04-07 DIAGNOSIS — Z1231 Encounter for screening mammogram for malignant neoplasm of breast: Secondary | ICD-10-CM | POA: Diagnosis present

## 2023-09-01 ENCOUNTER — Other Ambulatory Visit: Payer: Self-pay | Admitting: Physician Assistant

## 2023-09-01 ENCOUNTER — Ambulatory Visit
Admission: RE | Admit: 2023-09-01 | Discharge: 2023-09-01 | Disposition: A | Discharge status: Home / Self Care | Payer: Medicare Other | Source: Ambulatory Visit | Attending: Physician Assistant | Admitting: Physician Assistant

## 2023-09-01 DIAGNOSIS — M79604 Pain in right leg: Secondary | ICD-10-CM

## 2023-09-29 ENCOUNTER — Other Ambulatory Visit: Payer: Self-pay | Admitting: Physician Assistant

## 2023-09-29 DIAGNOSIS — M5416 Radiculopathy, lumbar region: Secondary | ICD-10-CM

## 2023-09-29 DIAGNOSIS — M79604 Pain in right leg: Secondary | ICD-10-CM

## 2023-10-01 ENCOUNTER — Ambulatory Visit
Admission: RE | Admit: 2023-10-01 | Discharge: 2023-10-01 | Disposition: A | Discharge status: Home / Self Care | Payer: Medicare Other | Source: Ambulatory Visit | Attending: Physician Assistant | Admitting: Physician Assistant

## 2023-10-01 DIAGNOSIS — M5416 Radiculopathy, lumbar region: Secondary | ICD-10-CM | POA: Diagnosis present

## 2023-10-01 DIAGNOSIS — M79604 Pain in right leg: Secondary | ICD-10-CM | POA: Insufficient documentation

## 2023-10-06 ENCOUNTER — Ambulatory Visit: Admission: RE | Admit: 2023-10-06 | Payer: Medicare Other | Source: Ambulatory Visit

## 2023-12-22 ENCOUNTER — Other Ambulatory Visit (HOSPITAL_COMMUNITY): Payer: Self-pay

## 2023-12-22 MED ORDER — FEBUXOSTAT 80 MG PO TABS
80.0000 mg | ORAL_TABLET | Freq: Every day | ORAL | 3 refills | Status: AC
  Filled 2024-02-18: qty 90, 90d supply, fill #0

## 2023-12-22 MED ORDER — METFORMIN HCL ER 500 MG PO TB24
500.0000 mg | ORAL_TABLET | Freq: Two times a day (BID) | ORAL | 3 refills | Status: AC
  Filled 2024-02-18: qty 180, 90d supply, fill #0

## 2023-12-22 MED ORDER — GABAPENTIN 100 MG PO CAPS
100.0000 mg | ORAL_CAPSULE | Freq: Three times a day (TID) | ORAL | 3 refills | Status: DC
  Filled 2024-03-02: qty 270, 90d supply, fill #0
  Filled 2024-05-24: qty 270, 90d supply, fill #1

## 2023-12-22 MED ORDER — ATORVASTATIN CALCIUM 40 MG PO TABS
40.0000 mg | ORAL_TABLET | Freq: Every day | ORAL | 3 refills | Status: AC
  Filled 2024-02-18 – 2024-11-15 (×2): qty 90, 90d supply, fill #0

## 2023-12-22 MED ORDER — OMEPRAZOLE 20 MG PO CPDR
20.0000 mg | DELAYED_RELEASE_CAPSULE | Freq: Every day | ORAL | 3 refills | Status: AC
  Filled 2024-02-18: qty 90, 90d supply, fill #0

## 2023-12-22 MED ORDER — CITALOPRAM HYDROBROMIDE 20 MG PO TABS
20.0000 mg | ORAL_TABLET | Freq: Every day | ORAL | 3 refills | Status: AC
  Filled 2024-02-18: qty 90, 90d supply, fill #0

## 2023-12-22 MED ORDER — AMLODIPINE BESYLATE 10 MG PO TABS
10.0000 mg | ORAL_TABLET | Freq: Every day | ORAL | 3 refills | Status: AC
  Filled 2024-02-18: qty 90, 90d supply, fill #0

## 2023-12-22 MED ORDER — LOSARTAN POTASSIUM 100 MG PO TABS
100.0000 mg | ORAL_TABLET | Freq: Every day | ORAL | 3 refills | Status: AC
  Filled 2024-02-18: qty 90, 90d supply, fill #0

## 2023-12-23 ENCOUNTER — Other Ambulatory Visit (HOSPITAL_COMMUNITY): Payer: Self-pay

## 2024-01-12 DIAGNOSIS — H04223 Epiphora due to insufficient drainage, bilateral lacrimal glands: Secondary | ICD-10-CM | POA: Diagnosis not present

## 2024-01-12 DIAGNOSIS — H43813 Vitreous degeneration, bilateral: Secondary | ICD-10-CM | POA: Diagnosis not present

## 2024-01-12 DIAGNOSIS — E119 Type 2 diabetes mellitus without complications: Secondary | ICD-10-CM | POA: Diagnosis not present

## 2024-01-12 DIAGNOSIS — H353131 Nonexudative age-related macular degeneration, bilateral, early dry stage: Secondary | ICD-10-CM | POA: Diagnosis not present

## 2024-02-09 DIAGNOSIS — I10 Essential (primary) hypertension: Secondary | ICD-10-CM | POA: Diagnosis not present

## 2024-02-09 DIAGNOSIS — E1122 Type 2 diabetes mellitus with diabetic chronic kidney disease: Secondary | ICD-10-CM | POA: Diagnosis not present

## 2024-02-09 DIAGNOSIS — I2510 Atherosclerotic heart disease of native coronary artery without angina pectoris: Secondary | ICD-10-CM | POA: Diagnosis not present

## 2024-02-09 DIAGNOSIS — N1832 Chronic kidney disease, stage 3b: Secondary | ICD-10-CM | POA: Diagnosis not present

## 2024-02-16 ENCOUNTER — Other Ambulatory Visit (HOSPITAL_COMMUNITY): Payer: Self-pay

## 2024-02-16 DIAGNOSIS — F325 Major depressive disorder, single episode, in full remission: Secondary | ICD-10-CM | POA: Diagnosis not present

## 2024-02-16 DIAGNOSIS — N1832 Chronic kidney disease, stage 3b: Secondary | ICD-10-CM | POA: Diagnosis not present

## 2024-02-16 DIAGNOSIS — I10 Essential (primary) hypertension: Secondary | ICD-10-CM | POA: Diagnosis not present

## 2024-02-16 DIAGNOSIS — E1169 Type 2 diabetes mellitus with other specified complication: Secondary | ICD-10-CM | POA: Diagnosis not present

## 2024-02-16 DIAGNOSIS — E785 Hyperlipidemia, unspecified: Secondary | ICD-10-CM | POA: Diagnosis not present

## 2024-02-16 DIAGNOSIS — E1122 Type 2 diabetes mellitus with diabetic chronic kidney disease: Secondary | ICD-10-CM | POA: Diagnosis not present

## 2024-02-16 DIAGNOSIS — I2510 Atherosclerotic heart disease of native coronary artery without angina pectoris: Secondary | ICD-10-CM | POA: Diagnosis not present

## 2024-02-16 DIAGNOSIS — E538 Deficiency of other specified B group vitamins: Secondary | ICD-10-CM | POA: Diagnosis not present

## 2024-02-16 MED ORDER — METFORMIN HCL ER 500 MG PO TB24
500.0000 mg | ORAL_TABLET | Freq: Two times a day (BID) | ORAL | 3 refills | Status: AC
  Filled 2024-02-16 – 2024-11-15 (×2): qty 180, 90d supply, fill #0

## 2024-02-16 MED ORDER — FUROSEMIDE 20 MG PO TABS
20.0000 mg | ORAL_TABLET | Freq: Every day | ORAL | 0 refills | Status: DC | PRN
  Filled 2024-02-16: qty 5, 5d supply, fill #0

## 2024-02-16 MED ORDER — LOSARTAN POTASSIUM 100 MG PO TABS
100.0000 mg | ORAL_TABLET | Freq: Every day | ORAL | 3 refills | Status: AC
  Filled 2024-02-16 – 2024-06-21 (×2): qty 90, 90d supply, fill #0
  Filled 2024-11-15: qty 90, 90d supply, fill #1

## 2024-02-16 MED ORDER — OMEPRAZOLE 20 MG PO CPDR
20.0000 mg | DELAYED_RELEASE_CAPSULE | Freq: Every day | ORAL | 3 refills | Status: AC
  Filled 2024-02-16 – 2024-11-15 (×2): qty 90, 90d supply, fill #0

## 2024-02-16 MED ORDER — ATORVASTATIN CALCIUM 40 MG PO TABS
40.0000 mg | ORAL_TABLET | Freq: Every day | ORAL | 3 refills | Status: AC
  Filled 2024-02-16: qty 90, 90d supply, fill #0

## 2024-02-16 MED ORDER — AMLODIPINE BESYLATE 10 MG PO TABS
10.0000 mg | ORAL_TABLET | Freq: Every day | ORAL | 3 refills | Status: AC
  Filled 2024-02-16 – 2024-08-25 (×2): qty 90, 90d supply, fill #0
  Filled 2024-11-15: qty 90, 90d supply, fill #1
  Filled 2024-11-29: qty 90, 90d supply, fill #2

## 2024-02-16 MED ORDER — CITALOPRAM HYDROBROMIDE 20 MG PO TABS
20.0000 mg | ORAL_TABLET | Freq: Every day | ORAL | 3 refills | Status: AC
  Filled 2024-02-16 – 2024-11-15 (×2): qty 90, 90d supply, fill #0

## 2024-02-17 ENCOUNTER — Other Ambulatory Visit (HOSPITAL_COMMUNITY): Payer: Self-pay

## 2024-02-18 ENCOUNTER — Other Ambulatory Visit: Payer: Self-pay

## 2024-02-18 ENCOUNTER — Other Ambulatory Visit (HOSPITAL_COMMUNITY): Payer: Self-pay

## 2024-02-23 ENCOUNTER — Other Ambulatory Visit: Payer: Self-pay

## 2024-03-02 ENCOUNTER — Other Ambulatory Visit (HOSPITAL_COMMUNITY): Payer: Self-pay

## 2024-03-02 ENCOUNTER — Other Ambulatory Visit: Payer: Self-pay

## 2024-03-08 ENCOUNTER — Other Ambulatory Visit: Payer: Self-pay | Admitting: Internal Medicine

## 2024-03-08 DIAGNOSIS — Z1231 Encounter for screening mammogram for malignant neoplasm of breast: Secondary | ICD-10-CM

## 2024-04-08 ENCOUNTER — Ambulatory Visit
Admission: RE | Admit: 2024-04-08 | Discharge: 2024-04-08 | Disposition: A | Discharge status: Home / Self Care | Payer: Self-pay | Source: Ambulatory Visit | Attending: Internal Medicine | Admitting: Internal Medicine

## 2024-04-08 DIAGNOSIS — Z1231 Encounter for screening mammogram for malignant neoplasm of breast: Secondary | ICD-10-CM | POA: Insufficient documentation

## 2024-04-23 DIAGNOSIS — Z96642 Presence of left artificial hip joint: Secondary | ICD-10-CM | POA: Diagnosis not present

## 2024-04-23 DIAGNOSIS — Z881 Allergy status to other antibiotic agents status: Secondary | ICD-10-CM | POA: Diagnosis not present

## 2024-04-23 DIAGNOSIS — R2689 Other abnormalities of gait and mobility: Secondary | ICD-10-CM | POA: Diagnosis not present

## 2024-04-23 DIAGNOSIS — M25552 Pain in left hip: Secondary | ICD-10-CM | POA: Diagnosis not present

## 2024-04-23 DIAGNOSIS — E785 Hyperlipidemia, unspecified: Secondary | ICD-10-CM | POA: Diagnosis not present

## 2024-04-23 DIAGNOSIS — Z86718 Personal history of other venous thrombosis and embolism: Secondary | ICD-10-CM | POA: Diagnosis not present

## 2024-04-23 DIAGNOSIS — E119 Type 2 diabetes mellitus without complications: Secondary | ICD-10-CM | POA: Diagnosis not present

## 2024-04-23 DIAGNOSIS — I10 Essential (primary) hypertension: Secondary | ICD-10-CM | POA: Diagnosis not present

## 2024-04-23 DIAGNOSIS — I2510 Atherosclerotic heart disease of native coronary artery without angina pectoris: Secondary | ICD-10-CM | POA: Diagnosis not present

## 2024-05-24 DIAGNOSIS — L578 Other skin changes due to chronic exposure to nonionizing radiation: Secondary | ICD-10-CM | POA: Diagnosis not present

## 2024-05-24 DIAGNOSIS — Z872 Personal history of diseases of the skin and subcutaneous tissue: Secondary | ICD-10-CM | POA: Diagnosis not present

## 2024-05-24 DIAGNOSIS — Z85820 Personal history of malignant melanoma of skin: Secondary | ICD-10-CM | POA: Diagnosis not present

## 2024-05-24 DIAGNOSIS — Z85828 Personal history of other malignant neoplasm of skin: Secondary | ICD-10-CM | POA: Diagnosis not present

## 2024-05-24 DIAGNOSIS — L570 Actinic keratosis: Secondary | ICD-10-CM | POA: Diagnosis not present

## 2024-06-21 ENCOUNTER — Other Ambulatory Visit (HOSPITAL_COMMUNITY): Payer: Self-pay

## 2024-08-23 ENCOUNTER — Other Ambulatory Visit: Payer: Self-pay

## 2024-08-23 ENCOUNTER — Other Ambulatory Visit (HOSPITAL_COMMUNITY): Payer: Self-pay

## 2024-08-23 MED ORDER — GABAPENTIN 100 MG PO CAPS
100.0000 mg | ORAL_CAPSULE | Freq: Three times a day (TID) | ORAL | 3 refills | Status: AC
  Filled 2024-08-23: qty 270, 90d supply, fill #0
  Filled 2024-11-22: qty 270, 90d supply, fill #1

## 2024-08-25 ENCOUNTER — Other Ambulatory Visit (HOSPITAL_COMMUNITY): Payer: Self-pay

## 2024-08-25 ENCOUNTER — Other Ambulatory Visit: Payer: Self-pay

## 2024-08-27 DIAGNOSIS — E785 Hyperlipidemia, unspecified: Secondary | ICD-10-CM | POA: Diagnosis not present

## 2024-08-27 DIAGNOSIS — I10 Essential (primary) hypertension: Secondary | ICD-10-CM | POA: Diagnosis not present

## 2024-08-27 DIAGNOSIS — E1169 Type 2 diabetes mellitus with other specified complication: Secondary | ICD-10-CM | POA: Diagnosis not present

## 2024-08-27 DIAGNOSIS — N1832 Chronic kidney disease, stage 3b: Secondary | ICD-10-CM | POA: Diagnosis not present

## 2024-08-27 DIAGNOSIS — E1122 Type 2 diabetes mellitus with diabetic chronic kidney disease: Secondary | ICD-10-CM | POA: Diagnosis not present

## 2024-09-03 DIAGNOSIS — F325 Major depressive disorder, single episode, in full remission: Secondary | ICD-10-CM | POA: Diagnosis not present

## 2024-09-03 DIAGNOSIS — R0989 Other specified symptoms and signs involving the circulatory and respiratory systems: Secondary | ICD-10-CM | POA: Diagnosis not present

## 2024-09-03 DIAGNOSIS — N1832 Chronic kidney disease, stage 3b: Secondary | ICD-10-CM | POA: Diagnosis not present

## 2024-09-03 DIAGNOSIS — I10 Essential (primary) hypertension: Secondary | ICD-10-CM | POA: Diagnosis not present

## 2024-09-03 DIAGNOSIS — E538 Deficiency of other specified B group vitamins: Secondary | ICD-10-CM | POA: Diagnosis not present

## 2024-09-03 DIAGNOSIS — Z0000 Encounter for general adult medical examination without abnormal findings: Secondary | ICD-10-CM | POA: Diagnosis not present

## 2024-09-03 DIAGNOSIS — E1122 Type 2 diabetes mellitus with diabetic chronic kidney disease: Secondary | ICD-10-CM | POA: Diagnosis not present

## 2024-09-03 DIAGNOSIS — Z1331 Encounter for screening for depression: Secondary | ICD-10-CM | POA: Diagnosis not present

## 2024-09-03 DIAGNOSIS — E785 Hyperlipidemia, unspecified: Secondary | ICD-10-CM | POA: Diagnosis not present

## 2024-09-03 DIAGNOSIS — E1169 Type 2 diabetes mellitus with other specified complication: Secondary | ICD-10-CM | POA: Diagnosis not present

## 2024-09-03 DIAGNOSIS — I2510 Atherosclerotic heart disease of native coronary artery without angina pectoris: Secondary | ICD-10-CM | POA: Diagnosis not present

## 2024-09-15 DIAGNOSIS — I2510 Atherosclerotic heart disease of native coronary artery without angina pectoris: Secondary | ICD-10-CM | POA: Diagnosis not present

## 2024-10-04 DIAGNOSIS — L820 Inflamed seborrheic keratosis: Secondary | ICD-10-CM | POA: Diagnosis not present

## 2024-10-04 DIAGNOSIS — D485 Neoplasm of uncertain behavior of skin: Secondary | ICD-10-CM | POA: Diagnosis not present

## 2024-11-15 ENCOUNTER — Other Ambulatory Visit (HOSPITAL_COMMUNITY): Payer: Self-pay

## 2024-11-15 ENCOUNTER — Other Ambulatory Visit: Payer: Self-pay

## 2024-11-29 ENCOUNTER — Other Ambulatory Visit (HOSPITAL_COMMUNITY): Payer: Self-pay
